# Patient Record
Sex: Female | Born: 1960
Health system: Southern US, Community
[De-identification: ages and names within clinical notes are randomized; demographics above are authoritative.]

## PROBLEM LIST (undated history)

## (undated) DIAGNOSIS — I10 Essential (primary) hypertension: Secondary | ICD-10-CM

## (undated) DIAGNOSIS — T7840XA Allergy, unspecified, initial encounter: Secondary | ICD-10-CM

## (undated) DIAGNOSIS — M1990 Unspecified osteoarthritis, unspecified site: Secondary | ICD-10-CM

## (undated) HISTORY — PX: NO PAST SURGERIES: SHX2092

## (undated) HISTORY — DX: Essential (primary) hypertension: I10

## (undated) HISTORY — DX: Allergy, unspecified, initial encounter: T78.40XA

---

## 2004-06-08 ENCOUNTER — Encounter: Admission: RE | Admit: 2004-06-08 | Discharge: 2004-06-08 | Payer: Self-pay | Admitting: Internal Medicine

## 2005-07-16 ENCOUNTER — Encounter: Admission: RE | Admit: 2005-07-16 | Discharge: 2005-07-16 | Payer: Self-pay | Admitting: Internal Medicine

## 2006-07-17 ENCOUNTER — Encounter: Admission: RE | Admit: 2006-07-17 | Discharge: 2006-07-17 | Payer: Self-pay | Admitting: Internal Medicine

## 2006-07-28 ENCOUNTER — Encounter: Admission: RE | Admit: 2006-07-28 | Discharge: 2006-07-28 | Payer: Self-pay | Admitting: Internal Medicine

## 2007-07-30 ENCOUNTER — Encounter: Admission: RE | Admit: 2007-07-30 | Discharge: 2007-07-30 | Payer: Self-pay | Admitting: Internal Medicine

## 2008-08-01 ENCOUNTER — Encounter: Admission: RE | Admit: 2008-08-01 | Discharge: 2008-08-01 | Payer: Self-pay | Admitting: Internal Medicine

## 2009-09-18 ENCOUNTER — Encounter: Admission: RE | Admit: 2009-09-18 | Discharge: 2009-09-18 | Payer: Self-pay | Admitting: Internal Medicine

## 2010-02-21 ENCOUNTER — Ambulatory Visit (HOSPITAL_COMMUNITY): Admission: RE | Admit: 2010-02-21 | Discharge: 2010-02-21 | Payer: Self-pay | Admitting: Internal Medicine

## 2010-08-12 ENCOUNTER — Encounter: Payer: Self-pay | Admitting: Internal Medicine

## 2010-09-07 ENCOUNTER — Other Ambulatory Visit: Payer: Self-pay | Admitting: Internal Medicine

## 2010-09-07 DIAGNOSIS — Z1231 Encounter for screening mammogram for malignant neoplasm of breast: Secondary | ICD-10-CM

## 2010-09-26 ENCOUNTER — Ambulatory Visit
Admission: RE | Admit: 2010-09-26 | Discharge: 2010-09-26 | Disposition: A | Discharge status: Home / Self Care | Payer: Commercial Managed Care - PPO | Source: Ambulatory Visit | Attending: Internal Medicine | Admitting: Internal Medicine

## 2010-09-26 DIAGNOSIS — Z1231 Encounter for screening mammogram for malignant neoplasm of breast: Secondary | ICD-10-CM

## 2010-10-15 ENCOUNTER — Ambulatory Visit: Payer: Self-pay

## 2011-08-29 ENCOUNTER — Other Ambulatory Visit: Payer: Self-pay | Admitting: Internal Medicine

## 2011-08-29 DIAGNOSIS — Z1231 Encounter for screening mammogram for malignant neoplasm of breast: Secondary | ICD-10-CM

## 2011-09-27 ENCOUNTER — Ambulatory Visit
Admission: RE | Admit: 2011-09-27 | Discharge: 2011-09-27 | Disposition: A | Discharge status: Home / Self Care | Payer: Commercial Managed Care - PPO | Source: Ambulatory Visit | Attending: Internal Medicine | Admitting: Internal Medicine

## 2011-09-27 DIAGNOSIS — Z1231 Encounter for screening mammogram for malignant neoplasm of breast: Secondary | ICD-10-CM

## 2012-09-21 ENCOUNTER — Other Ambulatory Visit: Payer: Self-pay

## 2012-09-21 DIAGNOSIS — Z1231 Encounter for screening mammogram for malignant neoplasm of breast: Secondary | ICD-10-CM

## 2012-10-26 ENCOUNTER — Ambulatory Visit
Admission: RE | Admit: 2012-10-26 | Discharge: 2012-10-26 | Disposition: A | Discharge status: Home / Self Care | Payer: BC Managed Care – PPO | Source: Ambulatory Visit

## 2012-10-26 DIAGNOSIS — Z1231 Encounter for screening mammogram for malignant neoplasm of breast: Secondary | ICD-10-CM

## 2012-12-02 ENCOUNTER — Encounter: Payer: Self-pay | Admitting: Obstetrics

## 2013-01-14 ENCOUNTER — Encounter: Payer: Self-pay | Admitting: Obstetrics & Gynecology

## 2013-01-14 ENCOUNTER — Ambulatory Visit (INDEPENDENT_AMBULATORY_CARE_PROVIDER_SITE_OTHER): Payer: BC Managed Care – PPO | Admitting: Obstetrics & Gynecology

## 2013-01-14 VITALS — BP 166/87 | HR 71 | Temp 98.4°F | Ht 62.0 in | Wt 172.6 lb

## 2013-01-14 DIAGNOSIS — Z01419 Encounter for gynecological examination (general) (routine) without abnormal findings: Secondary | ICD-10-CM | POA: Insufficient documentation

## 2013-01-14 NOTE — Patient Instructions (Addendum)

## 2013-01-14 NOTE — Progress Notes (Signed)
.   Subjective:     Shannon Williamson is a 52 y.o. female here for a routine exam.  No current complaints.  Personal health questionnaire reviewed: no.   Gynecologic History No LMP recorded. Patient is not currently having periods (Reason: Perimenopausal). Contraception: none Last Pap: 2013. Results were: normal Last mammogram: N/A  Obstetric History OB History   Grav Para Term Preterm Abortions TAB SAB Ect Mult Living                   The following portions of the patient's history were reviewed and updated as appropriate: allergies, current medications, past family history, past medical history, past social history, past surgical history and problem list.  Review of Systems Pertinent items are noted in HPI.    Objective:      General appearance: alert Breasts: normal appearance, no masses or tenderness Abdomen: soft, non-tender; bowel sounds normal; no masses,  no organomegaly Pelvic: cervix normal in appearance, external genitalia normal, no adnexal masses or tenderness, uterus normal size, shape, and consistency and vagina normal without discharge    Assessment:    Healthy female exam.    Plan:  Return prn

## 2013-01-18 LAB — PAP IG, CT-NG NAA, HPV HIGH-RISK
Chlamydia Probe Amp: NEGATIVE
GC Probe Amp: NEGATIVE
HPV DNA High Risk: DETECTED — AB

## 2013-01-29 ENCOUNTER — Encounter: Payer: Self-pay | Admitting: Obstetrics & Gynecology

## 2013-01-29 DIAGNOSIS — IMO0002 Reserved for concepts with insufficient information to code with codable children: Secondary | ICD-10-CM | POA: Insufficient documentation

## 2013-02-24 ENCOUNTER — Encounter: Payer: Self-pay | Admitting: *Deleted

## 2013-05-25 ENCOUNTER — Ambulatory Visit: Payer: BC Managed Care – PPO

## 2013-05-25 ENCOUNTER — Encounter: Payer: Self-pay | Admitting: Physician Assistant

## 2013-05-25 ENCOUNTER — Ambulatory Visit: Payer: BC Managed Care – PPO | Admitting: Family Medicine

## 2013-05-25 VITALS — BP 130/74 | HR 61 | Temp 98.1°F | Resp 16 | Ht 61.0 in | Wt 174.8 lb

## 2013-05-25 DIAGNOSIS — Z9289 Personal history of other medical treatment: Secondary | ICD-10-CM

## 2013-05-25 NOTE — Progress Notes (Signed)
Patient ID: Shannon Williamson MRN: [id number], DOB: [birth year], 52 y.o. Date of Encounter: 05/25/2013, 4:02 PM  Primary Physician: No primary provider on file.  Chief Complaint: Needs CXR  HPI: 52 y.o. 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 Patient with history of positive PPD on 04/17/06. Last had CXR on 04/21/06. She was treated with 9 months of INH 300 mg po daily from 05/12/06 through 01/21/07. This was verified through her treatment card with the [facility]. She denies any cough, fever, chills, weight loss, hemoptysis, night sweats, loss of appetite, SOB, CP, or fatigue. She is from [place] and does not remember if she ever received a BCG vaccine during her childhood.     Past Medical History  Diagnosis Date  . Allergy   . Hypertension      Home Meds: Prior to Admission medications   Medication Sig Start Date End Date Taking? Authorizing Provider  amitriptyline (ELAVIL) 25 MG tablet Take 25 mg by mouth at bedtime as needed for sleep.   Yes Historical Provider, MD  hydrochlorothiazide (MICROZIDE) 12.5 MG capsule Take 12.5 mg by mouth daily.   Yes Historical Provider, MD  loratadine (CLARITIN) 10 MG tablet Take 10 mg by mouth daily.   Yes Historical Provider, MD  metoprolol (LOPRESSOR) 100 MG tablet Take 100 mg by mouth daily.   Yes Historical Provider, MD  naproxen (NAPROSYN) 500 MG tablet Take 500 mg by mouth 2 (two) times daily with a meal.   Yes Historical Provider, MD    Allergies: No Known Allergies  History   Social History  . Marital Status: Married    Spouse Name: N/A    Number of Children: N/A  . Years of Education: N/A   Occupational History  . Not on file.   Social History Main Topics  . Smoking status: Never Smoker   . Smokeless tobacco: Not on file  . Alcohol Use: No  . Drug Use: No  . Sexual Activity: Yes   Other Topics Concern  . Not on file   Social History Narrative  . No narrative on file     Review of  Systems: Constitutional: negative for chills, fever, or fatigue  HEENT: negative for vision changes, hearing loss, congestion, rhinorrhea, ST, epistaxis, or sinus pressure Cardiovascular: negative for chest pain or palpitations Respiratory: negative for hemoptysis, wheezing, shortness of breath, or cough Abdominal: negative for abdominal pain, nausea, vomiting, diarrhea, or constipation Dermatological: negative for rash Neurologic: negative for headache, dizziness, or syncope   Physical Exam: Blood pressure 130/74, pulse 61, temperature 98.1 F (36.7 C), temperature source Oral, resp. rate 16, height 5\' 1"  (1.549 m), weight 174 lb 12.8 oz (79.289 kg), SpO2 99.00%., Body mass index is 33.05 kg/(m^2). General: Well developed, well nourished, in no acute distress. Head: Normocephalic, atraumatic, eyes without discharge, sclera non-icteric, nares are without discharge.   Neck: Supple. Full ROM.  Lungs: Clear bilaterally to auscultation without wheezes, rales, or rhonchi. Breathing is unlabored. Heart: RRR with S1 S2. No murmurs, rubs, or gallops appreciated. Msk:  Strength and tone normal for age. Extremities/Skin: Warm and dry. No clubbing or cyanosis. No edema. No rashes or suspicious lesions. Neuro: Alert and oriented X 3. Moves all extremities spontaneously. Gait is normal. CNII-XII grossly in tact. Psych:  Responds to questions appropriately with a normal affect.   [facility] reading (PRIMARY) by  Dr. Neva Seat.  CXR: Borderline cardiomegaly versus incomplete inspiration. No signs of active or latent tuberculosis.  ASSESSMENT AND PLAN:  52 y.o. female with history of positive PPD in 2007 successfully treated with INH 300 mg daily -No signs of tuberculosis -Cleared for work pending radiologist overread -Will call patient with overread for pick up    Signed, Eula Listen, PA-C [facility] [place], [place] [address] [phone] 05/25/2013 4:02 PM

## 2013-05-26 NOTE — Progress Notes (Signed)
Xray read and patient discussed with Ryan Dunn, PA-C. Agree with assessment and plan of care per his note.   

## 2013-06-11 ENCOUNTER — Other Ambulatory Visit: Payer: Self-pay | Admitting: Orthopedic Surgery

## 2013-06-14 ENCOUNTER — Encounter (HOSPITAL_BASED_OUTPATIENT_CLINIC_OR_DEPARTMENT_OTHER): Payer: Self-pay | Admitting: *Deleted

## 2013-06-14 ENCOUNTER — Encounter (HOSPITAL_BASED_OUTPATIENT_CLINIC_OR_DEPARTMENT_OTHER)
Admission: RE | Admit: 2013-06-14 | Discharge: 2013-06-14 | Disposition: A | Discharge status: Home / Self Care | Payer: BC Managed Care – PPO | Source: Ambulatory Visit | Attending: Orthopedic Surgery | Admitting: Orthopedic Surgery

## 2013-06-14 DIAGNOSIS — Z01810 Encounter for preprocedural cardiovascular examination: Secondary | ICD-10-CM | POA: Insufficient documentation

## 2013-06-14 DIAGNOSIS — Z01812 Encounter for preprocedural laboratory examination: Secondary | ICD-10-CM | POA: Insufficient documentation

## 2013-06-14 DIAGNOSIS — Z01818 Encounter for other preprocedural examination: Secondary | ICD-10-CM | POA: Insufficient documentation

## 2013-06-14 LAB — BASIC METABOLIC PANEL
GFR calc Af Amer: >90 mL/min (ref >90)
GFR calc non Af Amer: >90 mL/min (ref >90)
Glucose, Bld: 170 mg/dL — ABNORMAL HIGH (ref 70–99)
Potassium: 3.9 mEq/L (ref 3.5–5.1)
Sodium: 140 mEq/L (ref 135–145)

## 2013-06-14 NOTE — Progress Notes (Signed)
Pt does speak alittle english-husband speaks well-will try to get interpretor-requested To come in for [id number]

## 2013-06-21 ENCOUNTER — Ambulatory Visit (HOSPITAL_BASED_OUTPATIENT_CLINIC_OR_DEPARTMENT_OTHER)
Admission: RE | Admit: 2013-06-21 | Discharge: 2013-06-21 | Disposition: A | Discharge status: Home / Self Care | Payer: BC Managed Care – PPO | Source: Ambulatory Visit | Attending: Orthopedic Surgery | Admitting: Orthopedic Surgery

## 2013-06-21 ENCOUNTER — Encounter (HOSPITAL_BASED_OUTPATIENT_CLINIC_OR_DEPARTMENT_OTHER): Payer: Self-pay | Admitting: Certified Registered Nurse Anesthetist

## 2013-06-21 ENCOUNTER — Ambulatory Visit (HOSPITAL_BASED_OUTPATIENT_CLINIC_OR_DEPARTMENT_OTHER): Payer: BC Managed Care – PPO | Admitting: Anesthesiology

## 2013-06-21 ENCOUNTER — Encounter (HOSPITAL_BASED_OUTPATIENT_CLINIC_OR_DEPARTMENT_OTHER): Payer: BC Managed Care – PPO | Admitting: Anesthesiology

## 2013-06-21 ENCOUNTER — Encounter (HOSPITAL_BASED_OUTPATIENT_CLINIC_OR_DEPARTMENT_OTHER)
Admission: RE | Disposition: A | Discharge status: Home / Self Care | Payer: Self-pay | Source: Ambulatory Visit | Attending: Orthopedic Surgery

## 2013-06-21 DIAGNOSIS — I10 Essential (primary) hypertension: Secondary | ICD-10-CM | POA: Insufficient documentation

## 2013-06-21 DIAGNOSIS — G5600 Carpal tunnel syndrome, unspecified upper limb: Secondary | ICD-10-CM | POA: Insufficient documentation

## 2013-06-21 HISTORY — PX: CARPAL TUNNEL RELEASE: SHX101

## 2013-06-21 SURGERY — CARPAL TUNNEL RELEASE
Anesthesia: General | Site: Wrist | Laterality: Right

## 2013-06-21 MED ORDER — MIDAZOLAM HCL 2 MG/2ML IJ SOLN: 1.0000 mg | INTRAMUSCULAR | Status: DC | PRN

## 2013-06-21 MED ORDER — PROPOFOL 10 MG/ML IV BOLUS
INTRAVENOUS | Status: DC | PRN
  Administered 2013-06-21: 200 mg via INTRAVENOUS

## 2013-06-21 MED ORDER — BUPIVACAINE HCL (PF) 0.25 % IJ SOLN
INTRAMUSCULAR | Status: AC
  Filled 2013-06-21: qty 30

## 2013-06-21 MED ORDER — OXYCODONE HCL 5 MG/5ML PO SOLN: 5.0000 mg | Freq: Once | ORAL | Status: AC | PRN

## 2013-06-21 MED ORDER — CEFAZOLIN SODIUM-DEXTROSE 2-3 GM-% IV SOLR
2.0000 g | INTRAVENOUS | Status: AC
  Administered 2013-06-21: 2 g via INTRAVENOUS

## 2013-06-21 MED ORDER — OXYCODONE HCL 5 MG PO TABS
ORAL_TABLET | ORAL | Status: AC
  Filled 2013-06-21: qty 1

## 2013-06-21 MED ORDER — HYDROCODONE-ACETAMINOPHEN 5-325 MG PO TABS: 1.0000 | ORAL_TABLET | Freq: Four times a day (QID) | ORAL | Status: DC | PRN

## 2013-06-21 MED ORDER — HYDROMORPHONE HCL PF 1 MG/ML IJ SOLN: 0.2500 mg | INTRAMUSCULAR | Status: DC | PRN

## 2013-06-21 MED ORDER — FENTANYL CITRATE 0.05 MG/ML IJ SOLN
INTRAMUSCULAR | Status: AC
  Filled 2013-06-21: qty 4

## 2013-06-21 MED ORDER — ONDANSETRON HCL 4 MG/2ML IJ SOLN
INTRAMUSCULAR | Status: DC | PRN
  Administered 2013-06-21: 4 mg via INTRAVENOUS

## 2013-06-21 MED ORDER — LIDOCAINE HCL (CARDIAC) 20 MG/ML IV SOLN
INTRAVENOUS | Status: DC | PRN
  Administered 2013-06-21: 60 mg via INTRAVENOUS

## 2013-06-21 MED ORDER — LACTATED RINGERS IV SOLN
INTRAVENOUS | Status: DC
  Administered 2013-06-21 (×2): via INTRAVENOUS

## 2013-06-21 MED ORDER — CEFAZOLIN SODIUM-DEXTROSE 2-3 GM-% IV SOLR
INTRAVENOUS | Status: AC
  Filled 2013-06-21: qty 50

## 2013-06-21 MED ORDER — CHLORHEXIDINE GLUCONATE 4 % EX LIQD: 60.0000 mL | Freq: Once | CUTANEOUS | Status: DC

## 2013-06-21 MED ORDER — MIDAZOLAM HCL 5 MG/5ML IJ SOLN
INTRAMUSCULAR | Status: DC | PRN
  Administered 2013-06-21: 2 mg via INTRAVENOUS

## 2013-06-21 MED ORDER — MIDAZOLAM HCL 2 MG/2ML IJ SOLN
INTRAMUSCULAR | Status: AC
  Filled 2013-06-21: qty 2

## 2013-06-21 MED ORDER — OXYCODONE HCL 5 MG PO TABS
5.0000 mg | ORAL_TABLET | Freq: Once | ORAL | Status: AC | PRN
  Administered 2013-06-21: 5 mg via ORAL

## 2013-06-21 MED ORDER — FENTANYL CITRATE 0.05 MG/ML IJ SOLN: 50.0000 ug | INTRAMUSCULAR | Status: DC | PRN

## 2013-06-21 MED ORDER — BUPIVACAINE HCL (PF) 0.25 % IJ SOLN
INTRAMUSCULAR | Status: DC | PRN
  Administered 2013-06-21: 7 mL

## 2013-06-21 MED ORDER — PROMETHAZINE HCL 25 MG/ML IJ SOLN: 6.2500 mg | INTRAMUSCULAR | Status: DC | PRN

## 2013-06-21 MED ORDER — FENTANYL CITRATE 0.05 MG/ML IJ SOLN
INTRAMUSCULAR | Status: DC | PRN
  Administered 2013-06-21: 50 ug via INTRAVENOUS
  Administered 2013-06-21: 25 ug via INTRAVENOUS

## 2013-06-21 MED ORDER — DEXAMETHASONE SODIUM PHOSPHATE 10 MG/ML IJ SOLN
INTRAMUSCULAR | Status: DC | PRN
  Administered 2013-06-21: 5 mg via INTRAVENOUS

## 2013-06-21 SURGICAL SUPPLY — 38 items
BANDAGE GAUZE ELAST BULKY 4 IN (GAUZE/BANDAGES/DRESSINGS) ×2 IMPLANT
BLADE SURG 15 STRL LF DISP TIS (BLADE) ×1 IMPLANT
BLADE SURG 15 STRL SS (BLADE) ×1
BNDG COHESIVE 3X5 TAN STRL LF (GAUZE/BANDAGES/DRESSINGS) ×2 IMPLANT
BNDG ESMARK 4X9 LF (GAUZE/BANDAGES/DRESSINGS) ×2 IMPLANT
CHLORAPREP W/TINT 26ML (MISCELLANEOUS) ×2 IMPLANT
CORDS BIPOLAR (ELECTRODE) ×2 IMPLANT
COVER MAYO STAND STRL (DRAPES) ×2 IMPLANT
COVER TABLE BACK 60X90 (DRAPES) ×2 IMPLANT
CUFF TOURNIQUET SINGLE 18IN (TOURNIQUET CUFF) ×2 IMPLANT
DRAPE EXTREMITY T 121X128X90 (DRAPE) ×2 IMPLANT
DRAPE SURG 17X23 STRL (DRAPES) ×2 IMPLANT
DRSG KUZMA FLUFF (GAUZE/BANDAGES/DRESSINGS) ×2 IMPLANT
GAUZE XEROFORM 1X8 LF (GAUZE/BANDAGES/DRESSINGS) ×2 IMPLANT
GLOVE BIO SURGEON STRL SZ 6.5 (GLOVE) IMPLANT
GLOVE BIOGEL PI IND STRL 7.0 (GLOVE) ×1 IMPLANT
GLOVE BIOGEL PI IND STRL 8.5 (GLOVE) ×1 IMPLANT
GLOVE BIOGEL PI INDICATOR 7.0 (GLOVE) ×1
GLOVE BIOGEL PI INDICATOR 8.5 (GLOVE) ×1
GLOVE ECLIPSE 6.5 STRL STRAW (GLOVE) ×2 IMPLANT
GLOVE SURG ORTHO 8.0 STRL STRW (GLOVE) ×2 IMPLANT
GOWN BRE IMP PREV XXLGXLNG (GOWN DISPOSABLE) ×2 IMPLANT
GOWN PREVENTION PLUS XLARGE (GOWN DISPOSABLE) ×2 IMPLANT
NEEDLE 27GAX1X1/2 (NEEDLE) ×2 IMPLANT
NS IRRIG 1000ML POUR BTL (IV SOLUTION) ×2 IMPLANT
PACK BASIN DAY SURGERY FS (CUSTOM PROCEDURE TRAY) ×2 IMPLANT
PAD CAST 3X4 CTTN HI CHSV (CAST SUPPLIES) ×1 IMPLANT
PADDING CAST ABS 4INX4YD NS (CAST SUPPLIES) ×1
PADDING CAST ABS COTTON 4X4 ST (CAST SUPPLIES) ×1 IMPLANT
PADDING CAST COTTON 3X4 STRL (CAST SUPPLIES) ×1
SPONGE GAUZE 4X4 12PLY (GAUZE/BANDAGES/DRESSINGS) ×2 IMPLANT
STOCKINETTE 4X48 STRL (DRAPES) ×2 IMPLANT
SUT VICRYL 4-0 PS2 18IN ABS (SUTURE) IMPLANT
SUT VICRYL RAPIDE 4/0 PS 2 (SUTURE) ×2 IMPLANT
SYR BULB 3OZ (MISCELLANEOUS) ×2 IMPLANT
SYR CONTROL 10ML LL (SYRINGE) ×2 IMPLANT
TOWEL OR 17X24 6PK STRL BLUE (TOWEL DISPOSABLE) ×2 IMPLANT
UNDERPAD 30X30 INCONTINENT (UNDERPADS AND DIAPERS) ×2 IMPLANT

## 2013-06-21 NOTE — Anesthesia Procedure Notes (Signed)
Procedure Name: LMA Insertion Performed by: Nicki Reaper Pre-anesthesia Checklist: Patient identified, Emergency Drugs available, Suction available and Patient being monitored Patient Re-evaluated:Patient Re-evaluated prior to inductionOxygen Delivery Method: Circle System Utilized Preoxygenation: Pre-oxygenation with 100% oxygen Intubation Type: IV induction Ventilation: Mask ventilation without difficulty LMA: LMA inserted LMA Size: 4.0 Number of attempts: 1 Airway Equipment and Method: bite block Placement Confirmation: positive ETCO2 Tube secured with: Tape Dental Injury: Teeth and Oropharynx as per pre-operative assessment

## 2013-06-21 NOTE — Anesthesia Postprocedure Evaluation (Signed)
  Anesthesia Post-op Note  Patient: Shannon Williamson  Procedure(s) Performed: Procedure(s): RIGHT CARPAL TUNNEL RELEASE (Right)  Patient Location: PACU  Anesthesia Type:General  Level of Consciousness: awake  Airway and Oxygen Therapy: Patient Spontanous Breathing  Post-op Pain: mild  Post-op Assessment: Post-op Vital signs reviewed  Post-op Vital Signs: stable  Complications: No apparent anesthesia complications

## 2013-06-21 NOTE — Transfer of Care (Signed)
Immediate Anesthesia Transfer of Care Note  Patient: Shannon Williamson  Procedure(s) Performed: Procedure(s): RIGHT CARPAL TUNNEL RELEASE (Right)  Patient Location: PACU  Anesthesia Type:General  Level of Consciousness: awake and patient cooperative  Airway & Oxygen Therapy: Patient Spontanous Breathing and Patient connected to face mask oxygen  Post-op Assessment: Report given to PACU RN and Post -op Vital signs reviewed and stable  Post vital signs: Reviewed and stable  Complications: No apparent anesthesia complications

## 2013-06-21 NOTE — Op Note (Signed)
   Dictation Number [id number]

## 2013-06-21 NOTE — H&P (Signed)
Shannon Williamson is a 52 year-old right-hand dominant female with bilateral hand pain and numbness going on for five months waking her 7/7 nights. She has no history of injury to the hand or to the neck. She complains of decreased grip strength. She was placed on Naprosyn which she states does not give her significant relief. She complains of a constant, moderate aching type pain with a feeling of numbness of all of her fingers.  She has no history of diabetes, thyroid problems, arthritis of gout. These are equivalent right to left. She has had her nerve conductions done by Dr. Johna Roles revealing severe carpal tunnel syndrome bilaterally with a motor delay of 7.3 on the left, 6.7 on the right, sensory delay of 5.0 on the left and 4.1 on the right with amplitude diminution to 9.7 on the left and 10 on the right.   ALLERGIES:    None.  MEDICATIONS:     Metoprolol, HCTZ, loratadine, Naprosyn.   SURGICAL HISTORY:     None.  FAMILY MEDICAL HISTORY:   Negative.     SOCIAL HISTORY:    She does not smoke or drink.  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  REVIEW OF SYSTEMS:    Positive for high blood pressure, sleep disorder, otherwise negative 14 points.  Shannon Williamson is an 52 y.o. female.   Chief Complaint: numbness hands right HPI: see above  Past Medical History  Diagnosis Date  . Allergy   . Hypertension     Past Surgical History  Procedure Laterality Date  . No past surgeries      History reviewed. No pertinent family history. Social History:  reports that she has never smoked. She does not have any smokeless tobacco history on file. She reports that she does not drink alcohol or use illicit drugs.  Allergies: No Known Allergies  Medications Prior to Admission  Medication Sig Dispense Refill  . amitriptyline (ELAVIL) 25 MG tablet Take 25 mg by mouth at bedtime as needed for sleep.      . hydrochlorothiazide (MICROZIDE) 12.5 MG capsule Take 12.5 mg by mouth daily.      Marland Kitchen  loratadine (CLARITIN) 10 MG tablet Take 10 mg by mouth daily.      . metoprolol (LOPRESSOR) 100 MG tablet Take 100 mg by mouth daily.      . naproxen (NAPROSYN) 500 MG tablet Take 500 mg by mouth 2 (two) times daily with a meal.        Results for orders placed during the hospital encounter of 06/21/13 (from the past 48 hour(s))  POCT HEMOGLOBIN-HEMACUE     Status: None   Collection Time    06/21/13  1:00 PM      Result Value Range   Hemoglobin 13.8  12.0 - 15.0 g/dL    No results found.   Pertinent items are noted in HPI.  Blood pressure 177/88, pulse 79, temperature 98.2 F (36.8 C), temperature source Oral, resp. rate 20, height 5\' 1"  (1.549 m), weight 172 lb 6.4 oz (78.2 kg), SpO2 100.00%.  General appearance: alert, cooperative and appears stated age Head: Normocephalic, without obvious abnormality Neck: no JVD Resp: clear to auscultation bilaterally Cardio: regular rate and rhythm, S1, S2 normal, no murmur, click, rub or gallop GI: soft, non-tender; bowel sounds normal; no masses,  no organomegaly Extremities: extremities normal, atraumatic, no cyanosis or edema Pulses: 2+ and symmetric Skin: Skin color, texture, turgor normal. No rashes or lesions Neurologic: Grossly normal Incision/Wound:  na  Assessment/Plan We have discussed the possibility of surgical decompression of the median nerve at the wrist with her on each side. The pre, peri and post op course are discussed along with risks and complications.  She is aware there is no guarantee with surgery, possibility of infection, recurrence, injury to arteries, nerves and tendons, incomplete relief of symptoms and dystrophy.  She would like to proceed to have the right side done. She is scheduled for right carpal tunnel release as an outpatient under regional anesthesia.  Azura Tufaro R 06/21/2013, 1:48 PM

## 2013-06-21 NOTE — Anesthesia Preprocedure Evaluation (Addendum)
Anesthesia Evaluation  Patient identified by MRN, date of birth, ID band Patient awake    Reviewed: Allergy & Precautions, NPO status   Airway       Dental   Pulmonary  breath sounds clear to auscultation        Cardiovascular hypertension, Pt. on home beta blockers Rhythm:Regular Rate:Normal     Neuro/Psych    GI/Hepatic   Endo/Other    Renal/GU      Musculoskeletal   Abdominal   Peds  Hematology   Anesthesia Other Findings   Reproductive/Obstetrics                         Anesthesia Physical Anesthesia Plan  ASA: II  Anesthesia Plan: General   Post-op Pain Management:    Induction: Intravenous  Airway Management Planned: LMA  Additional Equipment:   Intra-op Plan:   Post-operative Plan: Extubation in OR  Informed Consent:   Dental advisory given  Plan Discussed with: CRNA and Surgeon  Anesthesia Plan Comments:        Anesthesia Quick Evaluation

## 2013-06-21 NOTE — Brief Op Note (Signed)
06/21/2013  3:06 PM  PATIENT:  Shannon Williamson  52 y.o. female  PRE-OPERATIVE DIAGNOSIS:  CARPAL TUNNEL SUNDROME;RIGHT  POST-OPERATIVE DIAGNOSIS:  Right Carpal Tunnel Syndrome  PROCEDURE:  Procedure(s): RIGHT CARPAL TUNNEL RELEASE (Right)  SURGEON:  Surgeon(s) and Role:    * Nicki Reaper, MD - Primary  PHYSICIAN ASSISTANT:   ASSISTANTS: none   ANESTHESIA:   local and general  EBL:  Total I/O In: 1000 [I.V.:1000] Out: -   BLOOD ADMINISTERED:none  DRAINS: none   LOCAL MEDICATIONS USED:  BUPIVICAINE   SPECIMEN:  No Specimen  DISPOSITION OF SPECIMEN:  N/A  COUNTS:  YES  TOURNIQUET:   Total Tourniquet Time Documented: Forearm (Right) - 13 minutes Total: Forearm (Right) - 13 minutes   DICTATION: .Other Dictation: Dictation Number [id number]  PLAN OF CARE: Discharge to home after PACU  PATIENT DISPOSITION:  PACU - hemodynamically stable.

## 2013-06-22 ENCOUNTER — Encounter (HOSPITAL_BASED_OUTPATIENT_CLINIC_OR_DEPARTMENT_OTHER): Payer: Self-pay | Admitting: Orthopedic Surgery

## 2013-06-22 NOTE — Op Note (Signed)
[name], Shannon Williamson          ACCOUNT NO.:  [id number]  MEDICAL RECORD NO.:  [id number]  LOCATION:                                 FACILITY:  PHYSICIAN:  Cindee Salt, M.D.            DATE OF BIRTH:  DATE OF PROCEDURE:  06/21/2013 DATE OF DISCHARGE:                              OPERATIVE REPORT   PREOPERATIVE DIAGNOSIS:  Carpal tunnel syndrome bilaterally.  POSTOPERATIVE DIAGNOSIS:  Carpal tunnel syndrome bilaterally.  OPERATION:  Decompression right median nerve at the wrist.  SURGEON:  Cindee Salt, MD  ANESTHESIA:  General with local infiltration.  ANESTHESIOLOGIST:  Burna Forts, MD  HISTORY:  The patient is a 52 year old female with a history of bilateral hand pain, nerve conductions are positive for carpal tunnel syndrome bilaterally.  She has elected to undergo surgical release. Pre, peri, and postoperative course have been discussed along with risks and complications.  She is aware that there is no guarantee with the surgery, possibility of infection, recurrence of injury to arteries, nerves, tendons, incomplete relief of symptoms, and dystrophy.  In the preoperative area, the patient is seen, the extremity marked by both the patient and surgeon.  Antibiotic given.  PROCEDURE IN DETAIL:  The patient was brought to the operating room where a general anesthetic was carried out without difficulty.  She was prepped using ChloraPrep, supine position with the right arm free.  A 3- minute dry time was allowed.  Time-out taken, confirming patient and procedure.  The limb was exsanguinated with an Esmarch bandage. Tourniquet placed and the forearm was inflated to 250 mmHg.  A longitudinal incision was made in the right palm and carried down through subcutaneous tissue.  Bleeders were electrocauterized.  Palmar fascia was split.  Superficial palmar arch identified.  Flexor tendon of the ring and little finger identified. To the ulnar side of the median nerve, the  carpal retinaculum was incised with sharp dissection.  Right angle and Sewall retractor were placed between the skin and forearm fascia.  The fascia released for approximately a centimeter and half to two under direct vision.  The canal was explored.  Tenosynovium tissue was moderately thickened.  The area of compression to the nerve was apparent.  Motor branch underneath the muscle.  No further lesions were identified.  The wound was irrigated.  The skin closed with interrupted 4-0 Vicryl Rapide sutures.  Local infiltration with 0.25% Marcaine without epinephrine was given, 8 mL was used.  A sterile compressive dressing was applied with the fingers free.  On deflation of the tourniquet, all fingers immediately pinked.  She was taken to the recovery room for observation in satisfactory condition.  She will be discharged home to return to the [facility] [place] in 1 week on [place].          ______________________________ Cindee Salt, M.D.     GK/MEDQ  D:  06/21/2013  T:  06/22/2013  Job:  [id number]

## 2013-10-13 ENCOUNTER — Other Ambulatory Visit: Payer: Self-pay

## 2013-10-13 DIAGNOSIS — Z1231 Encounter for screening mammogram for malignant neoplasm of breast: Secondary | ICD-10-CM

## 2013-11-04 ENCOUNTER — Ambulatory Visit
Admission: RE | Admit: 2013-11-04 | Discharge: 2013-11-04 | Disposition: A | Discharge status: Home / Self Care | Payer: BC Managed Care – PPO | Source: Ambulatory Visit

## 2013-11-04 DIAGNOSIS — Z1231 Encounter for screening mammogram for malignant neoplasm of breast: Secondary | ICD-10-CM

## 2014-10-25 ENCOUNTER — Other Ambulatory Visit: Payer: Self-pay

## 2014-10-25 DIAGNOSIS — Z1231 Encounter for screening mammogram for malignant neoplasm of breast: Secondary | ICD-10-CM

## 2014-11-08 ENCOUNTER — Ambulatory Visit
Admission: RE | Admit: 2014-11-08 | Discharge: 2014-11-08 | Disposition: A | Discharge status: Home / Self Care | Payer: BLUE CROSS/BLUE SHIELD | Source: Ambulatory Visit

## 2014-11-08 DIAGNOSIS — Z1231 Encounter for screening mammogram for malignant neoplasm of breast: Secondary | ICD-10-CM

## 2014-12-14 ENCOUNTER — Ambulatory Visit: Payer: BLUE CROSS/BLUE SHIELD | Admitting: Certified Nurse Midwife

## 2014-12-28 ENCOUNTER — Ambulatory Visit: Payer: BLUE CROSS/BLUE SHIELD | Admitting: Certified Nurse Midwife

## 2015-08-25 ENCOUNTER — Ambulatory Visit (INDEPENDENT_AMBULATORY_CARE_PROVIDER_SITE_OTHER): Payer: BLUE CROSS/BLUE SHIELD | Admitting: Internal Medicine

## 2015-08-25 VITALS — BP 136/82 | HR 85 | Temp 98.4°F | Resp 17 | Ht 63.5 in | Wt 173.0 lb

## 2015-08-25 DIAGNOSIS — R509 Fever, unspecified: Secondary | ICD-10-CM

## 2015-08-25 DIAGNOSIS — J22 Unspecified acute lower respiratory infection: Secondary | ICD-10-CM

## 2015-08-25 DIAGNOSIS — J988 Other specified respiratory disorders: Secondary | ICD-10-CM | POA: Diagnosis not present

## 2015-08-25 LAB — POCT CBC
Granulocyte percent: 81 %G — AB (ref 37–80)
HEMATOCRIT: 39.7 % (ref 37.7–47.9)
HEMOGLOBIN: 13.6 g/dL (ref 12.2–16.2)
LYMPH, POC: 2.1 (ref 0.6–3.4)
MCH, POC: 29.8 pg (ref 27–31.2)
MCHC: 34.1 g/dL (ref 31.8–35.4)
MCV: 87.4 fL (ref 80–97)
MID (cbc): 0.5 (ref 0–0.9)
MPV: 7.7 fL (ref 0–99.8)
POC Granulocyte: 11.4 — AB (ref 2–6.9)
POC LYMPH %: 15.1 % (ref 10–50)
POC MID %: 3.9 %M (ref 0–12)
Platelet Count, POC: 223 10*3/uL (ref 142–424)
RBC: 4.54 M/uL (ref 4.04–5.48)
RDW, POC: 13.6 %
WBC: 14.1 10*3/uL — AB (ref 4.6–10.2)

## 2015-08-25 MED ORDER — HYDROCODONE-HOMATROPINE 5-1.5 MG/5ML PO SYRP: 5.0000 mL | ORAL_SOLUTION | Freq: Four times a day (QID) | ORAL | Status: DC | PRN

## 2015-08-25 MED ORDER — AZITHROMYCIN 500 MG PO TABS: 500.0000 mg | ORAL_TABLET | Freq: Every day | ORAL | Status: DC

## 2015-08-25 NOTE — Progress Notes (Signed)
Subjective:  By signing my name below, I, Essence Howell, attest that this documentation has been prepared under the direction and in the presence of Tonye Pearson, MD Electronically Signed: Charline Bills, ED Scribe 08/25/2015 at 1:04 PM.   Patient ID: Shannon Williamson, female    DOB: Sep 01, 1960, 55 y.o.   MRN: [id number]  Chief Complaint  Patient presents with  . Cough  . Fatigue   HPI HPI Comments: Shannon Williamson is a 55 y.o. female, with a h/o HTN, who presents to the [facility] complaining of abrupt onset of cough, chills and fever, onset yesterday. Pt states that cough keeps her awake at night. She reports associated fatigue, diaphoresis, fever yesterday that has resolved, sore throat, generalized body aches. No treatments tried PTA. Pt denies sob and congestion. Had flu shot    Past Medical History  Diagnosis Date  . Allergy   . Hypertension    Current Outpatient Prescriptions on File Prior to Visit  Medication Sig Dispense Refill  . amitriptyline (ELAVIL) 25 MG tablet Take 25 mg by mouth at bedtime as needed for sleep.    . hydrochlorothiazide (MICROZIDE) 12.5 MG capsule Take 12.5 mg by mouth daily.    Marland Kitchen HYDROcodone-acetaminophen (NORCO) 5-325 MG per tablet Take 1 tablet by mouth every 6 (six) hours as needed for moderate pain. 30 tablet 0  . loratadine (CLARITIN) 10 MG tablet Take 10 mg by mouth daily.    . metoprolol (LOPRESSOR) 100 MG tablet Take 100 mg by mouth daily.    . naproxen (NAPROSYN) 500 MG tablet Take 500 mg by mouth 2 (two) times daily with a meal.     No current facility-administered medications on file prior to visit.   No Known Allergies  Review of Systems  Constitutional: Positive for fever (resolved), diaphoresis and fatigue.  HENT: Positive for sore throat. Negative for congestion.   Respiratory: Positive for cough. Negative for shortness of breath.   Musculoskeletal: Positive for myalgias.      Objective:   Physical Exam  Constitutional: She is oriented to person, place, and time. She appears well-developed and well-nourished. No distress.  HENT:  Head: Normocephalic and atraumatic.  Right Ear: External ear normal.  Left Ear: External ear normal.  Nose: Nose normal.  Mouth/Throat: Oropharynx is clear and moist. No oropharyngeal exudate.  Throat is clear. No nodes.  Eyes: Conjunctivae and EOM are normal. Pupils are equal, round, and reactive to light.  Neck: Neck supple. No thyromegaly present.  Cardiovascular: Normal rate, regular rhythm and normal heart sounds.   No murmur heard. Pulmonary/Chest: Effort normal. No respiratory distress. She has rhonchi (scattered).  decr R base Sl wheeze R ant forced expir that clears with cough  Musculoskeletal: Normal range of motion.  Lymphadenopathy:    She has no cervical adenopathy.  Neurological: She is alert and oriented to person, place, and time.  Skin: Skin is warm and dry.  Psychiatric: She has a normal mood and affect. Her behavior is normal.  Nursing note and vitals reviewed. BP 136/82 mmHg  Pulse 85  Temp(Src) 98.4 F (36.9 C) (Oral)  Resp 17  Ht 5' 3.5" (1.613 m)  Wt 173 lb (78.472 kg)  BMI 30.16 kg/m2  SpO2 98%      Assessment & Plan:  Acute LRI consistent with CAP  Meds ordered this encounter  Medications  . azithromycin (ZITHROMAX) 500 MG tablet    Sig: Take 1 tablet (500 mg total) by mouth daily.  Dispense:  5 tablet    Refill:  0  . HYDROcodone-homatropine (HYCODAN) 5-1.5 MG/5ML syrup    Sig: Take 5 mLs by mouth every 6 (six) hours as needed.    Dispense:  120 mL    Refill:  0  OOW Reck 3d  I have completed the patient encounter in its entirety as documented by the scribe, with editing by me where necessary. Emelina Hinch P. Merla Riches, M.D.

## 2015-08-25 NOTE — Patient Instructions (Signed)
Recheck Monday if not well enough for work

## 2015-10-23 ENCOUNTER — Other Ambulatory Visit: Payer: Self-pay

## 2015-10-23 DIAGNOSIS — Z1231 Encounter for screening mammogram for malignant neoplasm of breast: Secondary | ICD-10-CM

## 2015-11-13 ENCOUNTER — Ambulatory Visit: Payer: BLUE CROSS/BLUE SHIELD

## 2015-12-11 ENCOUNTER — Ambulatory Visit
Admission: RE | Admit: 2015-12-11 | Discharge: 2015-12-11 | Disposition: A | Discharge status: Home / Self Care | Payer: BLUE CROSS/BLUE SHIELD | Source: Ambulatory Visit

## 2015-12-11 DIAGNOSIS — Z1231 Encounter for screening mammogram for malignant neoplasm of breast: Secondary | ICD-10-CM

## 2016-02-06 DIAGNOSIS — J302 Other seasonal allergic rhinitis: Secondary | ICD-10-CM | POA: Diagnosis not present

## 2016-02-06 DIAGNOSIS — K219 Gastro-esophageal reflux disease without esophagitis: Secondary | ICD-10-CM | POA: Diagnosis not present

## 2016-02-06 DIAGNOSIS — I10 Essential (primary) hypertension: Secondary | ICD-10-CM | POA: Diagnosis not present

## 2016-03-05 DIAGNOSIS — I10 Essential (primary) hypertension: Secondary | ICD-10-CM | POA: Diagnosis not present

## 2016-03-05 DIAGNOSIS — K219 Gastro-esophageal reflux disease without esophagitis: Secondary | ICD-10-CM | POA: Diagnosis not present

## 2016-03-05 DIAGNOSIS — E559 Vitamin D deficiency, unspecified: Secondary | ICD-10-CM | POA: Diagnosis not present

## 2016-03-05 DIAGNOSIS — N390 Urinary tract infection, site not specified: Secondary | ICD-10-CM | POA: Diagnosis not present

## 2016-04-09 DIAGNOSIS — I10 Essential (primary) hypertension: Secondary | ICD-10-CM | POA: Diagnosis not present

## 2016-04-09 DIAGNOSIS — E2839 Other primary ovarian failure: Secondary | ICD-10-CM | POA: Diagnosis not present

## 2016-04-09 DIAGNOSIS — K219 Gastro-esophageal reflux disease without esophagitis: Secondary | ICD-10-CM | POA: Diagnosis not present

## 2016-04-09 DIAGNOSIS — J302 Other seasonal allergic rhinitis: Secondary | ICD-10-CM | POA: Diagnosis not present

## 2016-04-16 ENCOUNTER — Other Ambulatory Visit: Payer: Self-pay | Admitting: Internal Medicine

## 2016-04-16 DIAGNOSIS — E2839 Other primary ovarian failure: Secondary | ICD-10-CM

## 2016-04-29 ENCOUNTER — Ambulatory Visit
Admission: RE | Admit: 2016-04-29 | Discharge: 2016-04-29 | Disposition: A | Discharge status: Home / Self Care | Payer: BLUE CROSS/BLUE SHIELD | Source: Ambulatory Visit | Attending: Internal Medicine | Admitting: Internal Medicine

## 2016-04-29 DIAGNOSIS — Z13820 Encounter for screening for osteoporosis: Secondary | ICD-10-CM | POA: Diagnosis not present

## 2016-04-29 DIAGNOSIS — Z780 Asymptomatic menopausal state: Secondary | ICD-10-CM | POA: Diagnosis not present

## 2016-04-29 DIAGNOSIS — E2839 Other primary ovarian failure: Secondary | ICD-10-CM

## 2016-05-21 DIAGNOSIS — I10 Essential (primary) hypertension: Secondary | ICD-10-CM | POA: Diagnosis not present

## 2016-05-21 DIAGNOSIS — K219 Gastro-esophageal reflux disease without esophagitis: Secondary | ICD-10-CM | POA: Diagnosis not present

## 2016-05-21 DIAGNOSIS — E669 Obesity, unspecified: Secondary | ICD-10-CM | POA: Diagnosis not present

## 2016-05-21 DIAGNOSIS — J302 Other seasonal allergic rhinitis: Secondary | ICD-10-CM | POA: Diagnosis not present

## 2016-08-20 DIAGNOSIS — Z13220 Encounter for screening for lipoid disorders: Secondary | ICD-10-CM | POA: Diagnosis not present

## 2016-08-20 DIAGNOSIS — M5442 Lumbago with sciatica, left side: Secondary | ICD-10-CM | POA: Diagnosis not present

## 2016-08-20 DIAGNOSIS — Z131 Encounter for screening for diabetes mellitus: Secondary | ICD-10-CM | POA: Diagnosis not present

## 2016-08-20 DIAGNOSIS — I10 Essential (primary) hypertension: Secondary | ICD-10-CM | POA: Diagnosis not present

## 2016-09-19 DIAGNOSIS — K219 Gastro-esophageal reflux disease without esophagitis: Secondary | ICD-10-CM | POA: Diagnosis not present

## 2016-09-19 DIAGNOSIS — I10 Essential (primary) hypertension: Secondary | ICD-10-CM | POA: Diagnosis not present

## 2016-09-19 DIAGNOSIS — M5430 Sciatica, unspecified side: Secondary | ICD-10-CM | POA: Diagnosis not present

## 2016-10-14 DIAGNOSIS — M79605 Pain in left leg: Secondary | ICD-10-CM | POA: Diagnosis not present

## 2016-10-14 DIAGNOSIS — M79604 Pain in right leg: Secondary | ICD-10-CM | POA: Diagnosis not present

## 2016-10-21 DIAGNOSIS — M79604 Pain in right leg: Secondary | ICD-10-CM | POA: Diagnosis not present

## 2016-10-21 DIAGNOSIS — M79605 Pain in left leg: Secondary | ICD-10-CM | POA: Diagnosis not present

## 2016-10-28 DIAGNOSIS — M79604 Pain in right leg: Secondary | ICD-10-CM | POA: Diagnosis not present

## 2016-10-28 DIAGNOSIS — M79605 Pain in left leg: Secondary | ICD-10-CM | POA: Diagnosis not present

## 2016-10-31 DIAGNOSIS — K219 Gastro-esophageal reflux disease without esophagitis: Secondary | ICD-10-CM | POA: Diagnosis not present

## 2016-10-31 DIAGNOSIS — M5432 Sciatica, left side: Secondary | ICD-10-CM | POA: Diagnosis not present

## 2016-10-31 DIAGNOSIS — J302 Other seasonal allergic rhinitis: Secondary | ICD-10-CM | POA: Diagnosis not present

## 2016-10-31 DIAGNOSIS — I10 Essential (primary) hypertension: Secondary | ICD-10-CM | POA: Diagnosis not present

## 2016-11-04 DIAGNOSIS — M79604 Pain in right leg: Secondary | ICD-10-CM | POA: Diagnosis not present

## 2016-11-04 DIAGNOSIS — M79605 Pain in left leg: Secondary | ICD-10-CM | POA: Diagnosis not present

## 2016-11-25 DIAGNOSIS — M79605 Pain in left leg: Secondary | ICD-10-CM | POA: Diagnosis not present

## 2016-11-25 DIAGNOSIS — M79604 Pain in right leg: Secondary | ICD-10-CM | POA: Diagnosis not present

## 2016-11-26 ENCOUNTER — Other Ambulatory Visit: Payer: Self-pay | Admitting: Internal Medicine

## 2016-11-26 DIAGNOSIS — Z1231 Encounter for screening mammogram for malignant neoplasm of breast: Secondary | ICD-10-CM

## 2016-12-03 DIAGNOSIS — M5432 Sciatica, left side: Secondary | ICD-10-CM | POA: Diagnosis not present

## 2016-12-03 DIAGNOSIS — J302 Other seasonal allergic rhinitis: Secondary | ICD-10-CM | POA: Diagnosis not present

## 2016-12-03 DIAGNOSIS — I10 Essential (primary) hypertension: Secondary | ICD-10-CM | POA: Diagnosis not present

## 2016-12-03 DIAGNOSIS — K219 Gastro-esophageal reflux disease without esophagitis: Secondary | ICD-10-CM | POA: Diagnosis not present

## 2016-12-09 DIAGNOSIS — M79605 Pain in left leg: Secondary | ICD-10-CM | POA: Diagnosis not present

## 2016-12-09 DIAGNOSIS — M79604 Pain in right leg: Secondary | ICD-10-CM | POA: Diagnosis not present

## 2016-12-11 ENCOUNTER — Ambulatory Visit
Admission: RE | Admit: 2016-12-11 | Discharge: 2016-12-11 | Disposition: A | Discharge status: Home / Self Care | Payer: BLUE CROSS/BLUE SHIELD | Source: Ambulatory Visit | Attending: Internal Medicine | Admitting: Internal Medicine

## 2016-12-11 DIAGNOSIS — Z1231 Encounter for screening mammogram for malignant neoplasm of breast: Secondary | ICD-10-CM | POA: Diagnosis not present

## 2017-01-14 DIAGNOSIS — K219 Gastro-esophageal reflux disease without esophagitis: Secondary | ICD-10-CM | POA: Diagnosis not present

## 2017-01-14 DIAGNOSIS — I10 Essential (primary) hypertension: Secondary | ICD-10-CM | POA: Diagnosis not present

## 2017-01-14 DIAGNOSIS — E559 Vitamin D deficiency, unspecified: Secondary | ICD-10-CM | POA: Diagnosis not present

## 2017-01-14 DIAGNOSIS — M5432 Sciatica, left side: Secondary | ICD-10-CM | POA: Diagnosis not present

## 2017-02-26 ENCOUNTER — Ambulatory Visit (INDEPENDENT_AMBULATORY_CARE_PROVIDER_SITE_OTHER): Payer: BLUE CROSS/BLUE SHIELD | Admitting: Emergency Medicine

## 2017-02-26 ENCOUNTER — Encounter: Payer: Self-pay | Admitting: Emergency Medicine

## 2017-02-26 ENCOUNTER — Ambulatory Visit (INDEPENDENT_AMBULATORY_CARE_PROVIDER_SITE_OTHER): Payer: BLUE CROSS/BLUE SHIELD

## 2017-02-26 VITALS — BP 150/72 | HR 66 | Temp 98.7°F | Resp 16 | Ht 62.5 in | Wt 174.4 lb

## 2017-02-26 DIAGNOSIS — R059 Cough, unspecified: Secondary | ICD-10-CM | POA: Insufficient documentation

## 2017-02-26 DIAGNOSIS — R05 Cough: Secondary | ICD-10-CM

## 2017-02-26 DIAGNOSIS — Z1211 Encounter for screening for malignant neoplasm of colon: Secondary | ICD-10-CM | POA: Diagnosis not present

## 2017-02-26 DIAGNOSIS — J209 Acute bronchitis, unspecified: Secondary | ICD-10-CM

## 2017-02-26 MED ORDER — PROMETHAZINE-CODEINE 6.25-10 MG/5ML PO SYRP: 5.0000 mL | ORAL_SOLUTION | Freq: Every evening | ORAL | 0 refills | Status: DC | PRN

## 2017-02-26 MED ORDER — AZITHROMYCIN 250 MG PO TABS: ORAL_TABLET | ORAL | 0 refills | Status: DC

## 2017-02-26 MED ORDER — BENZONATATE 200 MG PO CAPS: 200.0000 mg | ORAL_CAPSULE | Freq: Two times a day (BID) | ORAL | 0 refills | Status: DC | PRN

## 2017-02-26 NOTE — Patient Instructions (Addendum)
     IF you received an x-ray today, you will receive an invoice from [facility]. Please contact [facility] at [phone] with questions or concerns regarding your invoice.   IF you received labwork today, you will receive an invoice from [facility]. Please contact [facility] at [phone] with questions or concerns regarding your invoice.   Our billing staff will not be able to assist you with questions regarding bills from these companies.  You will be contacted with the lab results as soon as they are available. The fastest way to get your results is to activate your My Chart account. Instructions are located on the last page of this paperwork. If you have not heard from us regarding the results in 2 weeks, please contact this office.      Acute Bronchitis, Adult Acute bronchitis is when air tubes (bronchi) in the lungs suddenly get swollen. The condition can make it hard to breathe. It can also cause these symptoms:  A cough.  Coughing up clear, yellow, or green mucus.  Wheezing.  Chest congestion.  Shortness of breath.  A fever.  Body aches.  Chills.  A sore throat.  Follow these instructions at home: Medicines  Take over-the-counter and prescription medicines only as told by your doctor.  If you were prescribed an antibiotic medicine, take it as told by your doctor. Do not stop taking the antibiotic even if you start to feel better. General instructions  Rest.  Drink enough fluids to keep your pee (urine) clear or pale yellow.  Avoid smoking and secondhand smoke. If you smoke and you need help quitting, ask your doctor. Quitting will help your lungs heal faster.  Use an inhaler, cool mist vaporizer, or humidifier as told by your doctor.  Keep all follow-up visits as told by your doctor. This is important. How is this prevented? To lower your risk of getting this condition again:  Wash your hands often with soap and water. If you cannot  use soap and water, use hand sanitizer.  Avoid contact with people who have cold symptoms.  Try not to touch your hands to your mouth, nose, or eyes.  Make sure to get the flu shot every year.  Contact a doctor if:  Your symptoms do not get better in 2 weeks. Get help right away if:  You cough up blood.  You have chest pain.  You have very bad shortness of breath.  You become dehydrated.  You faint (pass out) or keep feeling like you are going to pass out.  You keep throwing up (vomiting).  You have a very bad headache.  Your fever or chills gets worse. This information is not intended to replace advice given to you by your health care provider. Make sure you discuss any questions you have with your health care provider. Document Released: 12/25/2007 Document Revised: 02/14/2016 Document Reviewed: 12/27/2015 Elsevier Interactive Patient Education  2017 [employer]  

## 2017-02-26 NOTE — Progress Notes (Signed)
Shannon Williamson 56 y.o.   Chief Complaint  Patient presents with  . Cough    per patient started yesterday    HISTORY OF PRESENT ILLNESS: This is a 56 y.o. female complaining of dry cough since yesterday.  Cough  This is a new problem. The current episode started yesterday. The problem has been gradually worsening. The problem occurs constantly. The cough is non-productive. Pertinent negatives include no chest pain, chills, fever, headaches, hemoptysis, myalgias, nasal congestion, rash, sore throat or shortness of breath. Nothing aggravates the symptoms. Risk factors: none.     Prior to Admission medications   Medication Sig Start Date End Date Taking? Authorizing Provider  amitriptyline (ELAVIL) 25 MG tablet Take 25 mg by mouth at bedtime as needed for sleep.   Yes [provider]  diclofenac (VOLTAREN) 50 MG EC tablet Take 50 mg by mouth 2 (two) times daily.   Yes [provider]  Ergocalciferol (VITAMIN D2) 2000 units TABS Take by mouth.   Yes [provider]  gabapentin (NEURONTIN) 100 MG capsule Take 100 mg by mouth 3 (three) times daily.   Yes [provider]  lisinopril (PRINIVIL,ZESTRIL) 20 MG tablet Take 20 mg by mouth 2 (two) times daily.   Yes [provider]  loratadine (CLARITIN) 10 MG tablet Take 10 mg by mouth daily.   Yes [provider]  metoprolol (LOPRESSOR) 100 MG tablet Take 100 mg by mouth daily.   Yes [provider]  hydrochlorothiazide (MICROZIDE) 12.5 MG capsule Take 12.5 mg by mouth daily.    [provider]  HYDROcodone-acetaminophen (NORCO) 5-325 MG per tablet Take 1 tablet by mouth every 6 (six) hours as needed for moderate pain. Patient not taking: Reported on 02/26/2017 06/21/13   Cindee SaltKuzma, Gary, MD  HYDROcodone-homatropine [facility](HYCODAN) 5-1.5 MG/5ML syrup Take 5 mLs by mouth every 6 (six) hours as needed. Patient not taking: Reported on 02/26/2017 08/25/15   Tonye Pearsonoolittle, Robert P, MD    lisinopril-hydrochlorothiazide (PRINZIDE,ZESTORETIC) 10-12.5 MG tablet Take 1 tablet by mouth daily. 08/07/15   [provider]  naproxen (NAPROSYN) 500 MG tablet Take 500 mg by mouth 2 (two) times daily with a meal.    [provider]    No Known Allergies  Patient Active Problem List   Diagnosis Date Noted  . HPV test positive 01/29/2013  . Routine gynecological examination 01/14/2013    Past Medical History:  Diagnosis Date  . Allergy   . Hypertension     Past Surgical History:  Procedure Laterality Date  . CARPAL TUNNEL RELEASE Right 06/21/2013   Procedure: RIGHT CARPAL TUNNEL RELEASE;  Surgeon: Nicki ReaperGary R Kuzma, MD;  Location: [facility];  Service: Orthopedics;  Laterality: Right;  . NO PAST SURGERIES      Social History   Social History  . Marital status: Married    Spouse name: N/A  . Number of children: N/A  . Years of education: N/A   Occupational History  . Not on file.   Social History Main Topics  . Smoking status: Never Smoker  . Smokeless tobacco: Never Used  . Alcohol use No  . Drug use: No  . Sexual activity: Yes   Other Topics Concern  . Not on file   Social History Narrative  . No narrative on file    No family history on file.   Review of Systems  Constitutional: Negative.  Negative for chills and fever.  HENT: Negative.  Negative for sore throat.   Eyes:  Negative.   Respiratory: Positive for cough. Negative for hemoptysis, sputum production and shortness of breath.   Cardiovascular: Negative for chest pain and palpitations.  Gastrointestinal: Negative.  Negative for abdominal pain, diarrhea, nausea and vomiting.  Genitourinary: Negative.  Negative for dysuria and hematuria.  Musculoskeletal: Negative for myalgias.  Skin: Negative.  Negative for rash.  Neurological: Negative.  Negative for dizziness and headaches.  Endo/Heme/Allergies: Negative.   All other systems reviewed and are negative.  Vitals:    02/26/17 1149 02/26/17 1200  BP: (!) 162/66 (!) 150/72  Pulse: 66   Resp: 16   Temp: 98.7 F (37.1 C)     Physical Exam  Constitutional: She is oriented to person, place, and time. She appears well-developed and well-nourished.  HENT:  Head: Normocephalic and atraumatic.  Nose: Nose normal.  Mouth/Throat: Oropharynx is clear and moist.  Eyes: Pupils are equal, round, and reactive to light. Conjunctivae are normal.  Neck: Normal range of motion. Neck supple. No thyromegaly present.  Cardiovascular: Normal rate, regular rhythm, normal heart sounds and intact distal pulses.   Pulmonary/Chest: Effort normal and breath sounds normal.  Abdominal: Soft. Bowel sounds are normal. There is no tenderness.  Musculoskeletal: Normal range of motion.  Lymphadenopathy:    She has no cervical adenopathy.  Neurological: She is alert and oriented to person, place, and time. No sensory deficit. She exhibits normal muscle tone.  Skin: Skin is warm and dry. Capillary refill takes less than 2 seconds. No rash noted.  Psychiatric: She has a normal mood and affect. Her behavior is normal.  Vitals reviewed.  Dg Chest 2 View  Result Date: 02/26/2017 CLINICAL DATA:  Cough. EXAM: CHEST  2 VIEW COMPARISON:  05/25/2013 FINDINGS: The heart size and mediastinal contours are within normal limits. Both lungs are clear. The visualized skeletal structures are unremarkable. IMPRESSION: No active cardiopulmonary disease. Electronically Signed   By: Francene Boyers M.D.   On: 02/26/2017 12:20     ASSESSMENT & PLAN: [name] was seen today for cough.  Diagnoses and all orders for this visit:  Cough -     DG Chest 2 View; Future  Colon cancer screening -     Ambulatory referral to Gastroenterology  Acute bronchitis, unspecified organism  Other orders -     Cancel: CT VIRTUAL COLONOSCOPY SCREENING; Future -     azithromycin (ZITHROMAX) 250 MG tablet; Sig as indicated -     benzonatate (TESSALON) 200 MG capsule;  Take 1 capsule (200 mg total) by mouth 2 (two) times daily as needed for cough. -     promethazine-codeine (PHENERGAN WITH CODEINE) 6.25-10 MG/5ML syrup; Take 5 mLs by mouth at bedtime as needed for cough.    Patient Instructions       IF you received an x-ray today, you will receive an invoice from [facility]. Please contact [facility] at [phone] with questions or concerns regarding your invoice.   IF you received labwork today, you will receive an invoice from [facility]. Please contact [facility] at [phone] with questions or concerns regarding your invoice.   Our billing staff will not be able to assist you with questions regarding bills from these companies.  You will be contacted with the lab results as soon as they are available. The fastest way to get your results is to activate your My Chart account. Instructions are located on the last page of this paperwork. If you have not heard from [place] regarding the results in 2 weeks, please contact this  office.     Acute Bronchitis, Adult Acute bronchitis is when air tubes (bronchi) in the lungs suddenly get swollen. The condition can make it hard to breathe. It can also cause these symptoms:  A cough.  Coughing up clear, yellow, or green mucus.  Wheezing.  Chest congestion.  Shortness of breath.  A fever.  Body aches.  Chills.  A sore throat.  Follow these instructions at home: Medicines  Take over-the-counter and prescription medicines only as told by your doctor.  If you were prescribed an antibiotic medicine, take it as told by your doctor. Do not stop taking the antibiotic even if you start to feel better. General instructions  Rest.  Drink enough fluids to keep your pee (urine) clear or pale yellow.  Avoid smoking and secondhand smoke. If you smoke and you need help quitting, ask your doctor. Quitting will help your lungs heal faster.  Use an inhaler, cool mist vaporizer, or  humidifier as told by your doctor.  Keep all follow-up visits as told by your doctor. This is important. How is this prevented? To lower your risk of getting this condition again:  Wash your hands often with soap and water. If you cannot use soap and water, use hand sanitizer.  Avoid contact with people who have cold symptoms.  Try not to touch your hands to your mouth, nose, or eyes.  Make sure to get the flu shot every year.  Contact a doctor if:  Your symptoms do not get better in 2 weeks. Get help right away if:  You cough up blood.  You have chest pain.  You have very bad shortness of breath.  You become dehydrated.  You faint (pass out) or keep feeling like you are going to pass out.  You keep throwing up (vomiting).  You have a very bad headache.  Your fever or chills gets worse. This information is not intended to replace advice given to you by your health care provider. Make sure you discuss any questions you have with your health care provider. Document Released: 12/25/2007 Document Revised: 02/14/2016 Document Reviewed: 12/27/2015 Elsevier Interactive Patient Education  2017 Elsevier Inc.     Edwina Barth, MD [facility]

## 2017-03-06 ENCOUNTER — Encounter: Payer: Self-pay | Admitting: Gastroenterology

## 2017-03-06 DIAGNOSIS — J302 Other seasonal allergic rhinitis: Secondary | ICD-10-CM | POA: Diagnosis not present

## 2017-03-06 DIAGNOSIS — M5432 Sciatica, left side: Secondary | ICD-10-CM | POA: Diagnosis not present

## 2017-03-06 DIAGNOSIS — K219 Gastro-esophageal reflux disease without esophagitis: Secondary | ICD-10-CM | POA: Diagnosis not present

## 2017-03-06 DIAGNOSIS — I10 Essential (primary) hypertension: Secondary | ICD-10-CM | POA: Diagnosis not present

## 2017-03-17 ENCOUNTER — Other Ambulatory Visit: Payer: Self-pay | Admitting: Emergency Medicine

## 2017-03-17 DIAGNOSIS — Z124 Encounter for screening for malignant neoplasm of cervix: Secondary | ICD-10-CM

## 2017-04-03 ENCOUNTER — Ambulatory Visit (INDEPENDENT_AMBULATORY_CARE_PROVIDER_SITE_OTHER): Payer: BLUE CROSS/BLUE SHIELD

## 2017-04-03 ENCOUNTER — Ambulatory Visit (INDEPENDENT_AMBULATORY_CARE_PROVIDER_SITE_OTHER): Payer: BLUE CROSS/BLUE SHIELD | Admitting: Specialist

## 2017-04-03 ENCOUNTER — Encounter (INDEPENDENT_AMBULATORY_CARE_PROVIDER_SITE_OTHER): Payer: Self-pay | Admitting: Specialist

## 2017-04-03 VITALS — BP 171/76 | HR 55 | Ht 63.0 in | Wt 176.0 lb

## 2017-04-03 DIAGNOSIS — M5136 Other intervertebral disc degeneration, lumbar region: Secondary | ICD-10-CM | POA: Diagnosis not present

## 2017-04-03 DIAGNOSIS — M48062 Spinal stenosis, lumbar region with neurogenic claudication: Secondary | ICD-10-CM | POA: Diagnosis not present

## 2017-04-03 DIAGNOSIS — G8929 Other chronic pain: Secondary | ICD-10-CM | POA: Diagnosis not present

## 2017-04-03 DIAGNOSIS — M5442 Lumbago with sciatica, left side: Secondary | ICD-10-CM

## 2017-04-03 DIAGNOSIS — M4316 Spondylolisthesis, lumbar region: Secondary | ICD-10-CM | POA: Diagnosis not present

## 2017-04-03 NOTE — Progress Notes (Signed)
Office Visit Note   Patient: Shannon Williamson           Date of Birth: Oct 23, 1960           MRN: [id number] Visit Date: 04/03/2017              Requested by: Fleet Contras, MD [address] [place], [place] [address] PCP: Fleet Contras, MD   Assessment & Plan: Visit Diagnoses:  1. Chronic left-sided low back pain with left-sided sciatica   2. Spondylolisthesis, lumbar region   3. Spinal stenosis of lumbar region with neurogenic claudication   4. Degenerative disc disease, lumbar   5. Other intervertebral disc degeneration, lumbar region   6. Spondylolisthesis of lumbar region     Plan: Avoid bending, stooping and avoid lifting weights greater than 10 lbs. Avoid prolong standing and walking. Avoid frequent bending and stooping  May use ice or moist heat for pain. Weight loss is of benefit. Handicap license is approved. Use cart at grocery shopping, use stationary bicycle for exercise. Pool walking also for exercise. An MRI is ordered and you will be called to make an appointment for this.  Follow-Up Instructions: Return in about 3 weeks (around 04/24/2017).   Orders:  Orders Placed This Encounter  Procedures  . XR Lumbar Spine 2-3 Views  . MR Lumbar Spine w/o contrast   No orders of the defined types were placed in this encounter.     Procedures: No procedures performed   Clinical Data: No additional findings.   Subjective: Chief Complaint  Patient presents with  . Lower Back - Pain    56 year old female For [place], [place], migration to [place] 15 years ago. She has had back pain just over the past 6 months. It was not associated with an injury or fall. She relates that standing or sitting is painful. Pain is worsened with standing or sitting and with picking up items. Some times when she goes to work she has pain with standing all day. 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 She has children that are  21-28, three boys and one girl. The pain is in the posterior left buttock and radiates down into the left calf and left foot dorsolateral left foot. Has pain with coughing or sneezing. She has difficulty with sleeping, hard to turn in bed and to get comfortable. She can walk but can not walk continuously. She has to lean on chair or sit or lean on a buggy at the grocery store. There is numbness in the left  And the right leg but mostly left. No bowel or bladder difficulties. Seen by Dr. Concepcion Elk about a month ago and is referred for evaluation of her spine.      Review of Systems  Constitutional: Positive for activity change and unexpected weight change. Negative for appetite change, chills, diaphoresis, fatigue and fever.  HENT: Negative.  Negative for ear discharge, postnasal drip, sinus pain, sneezing, sore throat, trouble swallowing and voice change.   Eyes: Negative for pain, discharge, redness, itching and visual disturbance.  Respiratory: Negative for cough, choking, chest tightness, shortness of breath and wheezing.   Cardiovascular: Negative for chest pain, palpitations and leg swelling.  Gastrointestinal: Negative for abdominal distention, abdominal pain, anal bleeding, blood in stool, constipation, diarrhea, nausea and vomiting.  Endocrine: Negative for cold intolerance, heat intolerance and polyuria.  Genitourinary: Negative for difficulty urinating, dysuria, flank pain, frequency, genital sores  and hematuria.  Musculoskeletal: Positive for back pain, gait problem and joint swelling. Negative for neck pain and neck stiffness.  Skin: Negative for color change, pallor, rash and wound.  Allergic/Immunologic: Positive for immunocompromised state.  Neurological: Positive for dizziness, weakness and numbness. Negative for tremors, seizures, syncope, facial asymmetry, speech difficulty, light-headedness and headaches.  Hematological: Negative for adenopathy. Does not bruise/bleed easily.    Psychiatric/Behavioral: Positive for dysphoric mood and sleep disturbance. Negative for agitation, behavioral problems, confusion, decreased concentration, hallucinations, self-injury and suicidal ideas. The patient is not nervous/anxious and is not hyperactive.      Objective: Vital Signs: BP (!) 171/76 (BP Location: Left Arm, Patient Position: Sitting)   Pulse (!) 55   Ht 5\' 3"  (1.6 m)   Wt 176 lb (79.8 kg)   BMI 31.18 kg/m   Physical Exam  Constitutional: She is oriented to person, place, and time. She appears well-developed and well-nourished.  HENT:  Head: Normocephalic and atraumatic.  Eyes: Pupils are equal, round, and reactive to light. EOM are normal.  Neck: Normal range of motion. Neck supple.  Pulmonary/Chest: Effort normal and breath sounds normal.  Abdominal: Soft. Bowel sounds are normal.  Neurological: She is alert and oriented to person, place, and time.  Skin: Skin is warm and dry.  Psychiatric: She has a normal mood and affect. Her behavior is normal. Judgment and thought content normal.    Back Exam   Tenderness  The patient is experiencing tenderness in the lumbar.  Range of Motion  Extension:  10 abnormal  Flexion:  80 abnormal  Lateral Bend Right: abnormal  Lateral Bend Left: abnormal  Rotation Right: abnormal  Rotation Left: abnormal   Muscle Strength  Right Quadriceps:  5/5  Left Quadriceps:  5/5  Right Hamstrings:  5/5  Left Hamstrings:  5/5   Tests  Straight leg raise right: negative Straight leg raise left: negative  Reflexes  Patellar:  2/4 normal Achilles: 2/4  Other  Toe Walk: normal Heel Walk: normal Sensation: decreased Gait: normal       Specialty Comments:  No specialty comments available.  Imaging: Xr Lumbar Spine 2-3 Views  Result Date: 04/03/2017 AP and lateral flexion and extension radiographs show a grade 1 spondylolisthesis at L4-5 worsening to Grade 2 with flexion, no definite posterior element lucency.  There is mild posterior L5-S1 degenerative disc changes with narrowing of the posterior disc at L5-S1.      PMFS History: Patient Active Problem List   Diagnosis Date Noted  . Cough 02/26/2017  . Colon cancer screening 02/26/2017  . Acute bronchitis 02/26/2017  . HPV test positive 01/29/2013  . Routine gynecological examination 01/14/2013   Past Medical History:  Diagnosis Date  . Allergy   . Hypertension     No family history on file.  Past Surgical History:  Procedure Laterality Date  . CARPAL TUNNEL RELEASE Right 06/21/2013   Procedure: RIGHT CARPAL TUNNEL RELEASE;  Surgeon: Nicki ReaperGary R Kuzma, MD;  Location: [facility];  Service: Orthopedics;  Laterality: Right;  . NO PAST SURGERIES     Social History   Occupational History  . Not on file.   Social History Main Topics  . Smoking status: Never Smoker  . Smokeless tobacco: Never Used  . Alcohol use No  . Drug use: No  . Sexual activity: Yes

## 2017-04-03 NOTE — Patient Instructions (Signed)
Avoid bending, stooping and avoid lifting weights greater than 10 lbs. Avoid prolong standing and walking. Avoid frequent bending and stooping  May use ice or moist heat for pain. Weight loss is of benefit. Handicap license is approved. Use cart at grocery shopping, use stationary bicycle for exercise. Pool walking also for exercise. An MRI is ordered and you will be called to make an appointment for this.

## 2017-04-16 ENCOUNTER — Encounter: Payer: Self-pay | Admitting: Obstetrics and Gynecology

## 2017-04-17 ENCOUNTER — Ambulatory Visit
Admission: RE | Admit: 2017-04-17 | Discharge: 2017-04-17 | Disposition: A | Discharge status: Home / Self Care | Payer: BLUE CROSS/BLUE SHIELD | Source: Ambulatory Visit | Attending: Specialist | Admitting: Specialist

## 2017-04-17 DIAGNOSIS — M48062 Spinal stenosis, lumbar region with neurogenic claudication: Secondary | ICD-10-CM

## 2017-04-17 DIAGNOSIS — M4316 Spondylolisthesis, lumbar region: Secondary | ICD-10-CM

## 2017-04-17 DIAGNOSIS — M5136 Other intervertebral disc degeneration, lumbar region: Secondary | ICD-10-CM

## 2017-04-17 DIAGNOSIS — M48061 Spinal stenosis, lumbar region without neurogenic claudication: Secondary | ICD-10-CM | POA: Diagnosis not present

## 2017-04-23 ENCOUNTER — Encounter (INDEPENDENT_AMBULATORY_CARE_PROVIDER_SITE_OTHER): Payer: Self-pay | Admitting: Specialist

## 2017-04-23 ENCOUNTER — Ambulatory Visit (INDEPENDENT_AMBULATORY_CARE_PROVIDER_SITE_OTHER): Payer: BLUE CROSS/BLUE SHIELD | Admitting: Specialist

## 2017-04-23 VITALS — BP 167/72 | HR 60 | Ht 63.0 in | Wt 176.0 lb

## 2017-04-23 DIAGNOSIS — M4316 Spondylolisthesis, lumbar region: Secondary | ICD-10-CM

## 2017-04-23 DIAGNOSIS — M48062 Spinal stenosis, lumbar region with neurogenic claudication: Secondary | ICD-10-CM | POA: Diagnosis not present

## 2017-04-23 NOTE — Patient Instructions (Addendum)
Plan: Avoid bending, stooping and avoid lifting weights greater than 10 lbs. Avoid prolong standing and walking. Avoid frequent bending and stooping  No lifting greater than 10 lbs. May use ice or moist heat for pain. Weight loss is of benefit. Handicap license is approved. Options concerning treatment of this condition include, steroid injections that may temporarily improve the pain, exercises and avoidance of prolong standing and walking, Or just live with the pain. Call us when you have made a decision.

## 2017-04-23 NOTE — Progress Notes (Signed)
Office Visit Note   Patient: Shannon Williamson           Date of Birth: November 17, 1960           MRN: [id number] Visit Date: 04/23/2017              Requested by: Fleet Contras, MD [address] [place], [place] [address] PCP: Fleet Contras, MD   Assessment & Plan: Visit Diagnoses:  1. Spinal stenosis of lumbar region with neurogenic claudication   2. Spondylolisthesis of lumbar region     Plan: Avoid bending, stooping and avoid lifting weights greater than 10 lbs. Avoid prolong standing and walking. Avoid frequent bending and stooping  No lifting greater than 10 lbs. May use ice or moist heat for pain. Weight loss is of benefit. Handicap license is approved. Options concerning treatment of this condition include, steroid injections that may temporarily improve the pain, exercises and avoidance of prolong standing and walking, Or just live with the pain. Call us when you have made a decision.  Follow-Up Instructions: No Follow-up on file.   Orders:  No orders of the defined types were placed in this encounter.  No orders of the defined types were placed in this encounter.     Procedures: No procedures performed   Clinical Data: No additional findings.   Subjective: Chief Complaint  Patient presents with  . Lower Back - Follow-up    MRI Review---Lumbar     56 year old female with history of back pain and increasing difficulty with walking. She has been trying to exercise, flexion exercises and is avoiding prolong standing and walking. She did  PT for about 6 weeks and now she is returning to review the recent MRI and also to discuss treatment options.     Review of Systems  Constitutional: Negative.   HENT: Negative.   Eyes: Negative.   Respiratory: Negative.   Cardiovascular: Negative.   Gastrointestinal: Negative.   Endocrine: Negative.   Genitourinary: Negative.   Musculoskeletal: Negative.   Skin: Negative.   Allergic/Immunologic: Negative.     Neurological: Negative.   Hematological: Negative.   Psychiatric/Behavioral: Negative.      Objective: Vital Signs: BP (!) 167/72 (BP Location: Left Arm, Patient Position: Sitting)   Pulse 60   Ht  (1.6 m)   Wt 176 lb (79.8 kg)   BMI 31.18 kg/m   Physical Exam  Constitutional: She is oriented to person, place, and time. She appears well-developed and well-nourished.  HENT:  Head: Normocephalic and atraumatic.  Eyes: Pupils are equal, round, and reactive to light. EOM are normal.  Neck: Normal range of motion. Neck supple.  Pulmonary/Chest: Effort normal and breath sounds normal.  Abdominal: Soft. Bowel sounds are normal.  Neurological: She is alert and oriented to person, place, and time.  Skin: Skin is warm and dry.  Psychiatric: She has a normal mood and affect. Her behavior is normal. Judgment and thought content normal.    Back Exam   Tenderness  The patient is experiencing tenderness in the lumbar.  Range of Motion  Extension: abnormal  Flexion: abnormal  Lateral Bend Right: abnormal  Lateral Bend Left: abnormal  Rotation Right: abnormal  Rotation Left: abnormal   Muscle Strength  Right Quadriceps:  5/5  Left Quadriceps:  5/5  Right Hamstrings:  5/5  Left Hamstrings:  5/5   Tests  Straight leg raise right: negative Straight leg raise left: negative  Reflexes  Patellar: normal Achilles: normal Babinski's sign: normal  Other  Toe Walk: normal Heel Walk: normal Gait: normal  Erythema: no back redness Scars: absent      Specialty Comments:  No specialty comments available.  Imaging: No results found.   PMFS History: Patient Active Problem List   Diagnosis Date Noted  . Cough 02/26/2017  . Colon cancer screening 02/26/2017  . Acute bronchitis 02/26/2017  . HPV test positive 01/29/2013  . Routine gynecological examination 01/14/2013   Past Medical History:  Diagnosis Date  . Allergy   . Hypertension     No family history on  file.  Past Surgical History:  Procedure Laterality Date  . CARPAL TUNNEL RELEASE Right 06/21/2013   Procedure: RIGHT CARPAL TUNNEL RELEASE;  Surgeon: Nicki Reaper, MD;  Location: [facility];  Service: Orthopedics;  Laterality: Right;  . NO PAST SURGERIES     Social History   Occupational History  . Not on file.   Social History Main Topics  . Smoking status: Never Smoker  . Smokeless tobacco: Never Used  . Alcohol use No  . Drug use: No  . Sexual activity: Yes

## 2017-05-13 ENCOUNTER — Encounter: Payer: BLUE CROSS/BLUE SHIELD | Admitting: Gastroenterology

## 2017-05-28 ENCOUNTER — Encounter: Payer: Self-pay | Admitting: Obstetrics

## 2017-05-28 ENCOUNTER — Ambulatory Visit (INDEPENDENT_AMBULATORY_CARE_PROVIDER_SITE_OTHER): Payer: BLUE CROSS/BLUE SHIELD | Admitting: Obstetrics

## 2017-05-28 ENCOUNTER — Other Ambulatory Visit: Payer: Self-pay

## 2017-05-28 ENCOUNTER — Ambulatory Visit (INDEPENDENT_AMBULATORY_CARE_PROVIDER_SITE_OTHER): Payer: BLUE CROSS/BLUE SHIELD | Admitting: Specialist

## 2017-05-28 ENCOUNTER — Encounter (INDEPENDENT_AMBULATORY_CARE_PROVIDER_SITE_OTHER): Payer: Self-pay | Admitting: Specialist

## 2017-05-28 VITALS — BP 170/71 | HR 58 | Ht 62.0 in | Wt 178.0 lb

## 2017-05-28 VITALS — BP 176/73 | HR 62 | Ht 63.0 in | Wt 176.0 lb

## 2017-05-28 DIAGNOSIS — E669 Obesity, unspecified: Secondary | ICD-10-CM

## 2017-05-28 DIAGNOSIS — Z1151 Encounter for screening for human papillomavirus (HPV): Secondary | ICD-10-CM

## 2017-05-28 DIAGNOSIS — I10 Essential (primary) hypertension: Secondary | ICD-10-CM

## 2017-05-28 DIAGNOSIS — Z124 Encounter for screening for malignant neoplasm of cervix: Secondary | ICD-10-CM

## 2017-05-28 DIAGNOSIS — Z01419 Encounter for gynecological examination (general) (routine) without abnormal findings: Secondary | ICD-10-CM

## 2017-05-28 DIAGNOSIS — R87810 Cervical high risk human papillomavirus (HPV) DNA test positive: Secondary | ICD-10-CM

## 2017-05-28 DIAGNOSIS — Z5321 Procedure and treatment not carried out due to patient leaving prior to being seen by health care provider: Secondary | ICD-10-CM

## 2017-05-28 NOTE — Progress Notes (Signed)
Subjective:        Shannon Williamson is a 56 y.o. female here for a routine exam.  Current complaints: None.    Personal health questionnaire:  Is patient Ashkenazi Jewish, have a family history of breast and/or ovarian cancer: no Is there a family history of uterine cancer diagnosed at age < 5250, gastrointestinal cancer, urinary tract cancer, family member who is a Personnel officerLynch syndrome-associated carrier: no Is the patient overweight and hypertensive, family history of diabetes, personal history of gestational diabetes, preeclampsia or PCOS: no Is patient over 5655, have PCOS,  family history of premature CHD under age 55, diabetes, smoke, have hypertension or peripheral artery disease:  no At any time, has a partner hit, kicked or otherwise hurt or frightened you?: no Over the past 2 weeks, have you felt down, depressed or hopeless?: no Over the past 2 weeks, have you felt little interest or pleasure in doing things?:no   Gynecologic History No LMP recorded. Patient is not currently having periods (Reason: Perimenopausal). Contraception: post menopausal status Last Pap: 2014. Results were: Positive HPV Last mammogram: 2018. Results were: normal  Obstetric History OB History  No data available    Past Medical History:  Diagnosis Date  . Allergy   . Hypertension     Past Surgical History:  Procedure Laterality Date  . NO PAST SURGERIES       Current Outpatient Medications:  .  amitriptyline (ELAVIL) 25 MG tablet, Take 25 mg by mouth at bedtime as needed for sleep., Disp: , Rfl:  .  diclofenac (VOLTAREN) 50 MG EC tablet, Take 50 mg by mouth 2 (two) times daily., Disp: , Rfl:  .  Ergocalciferol (VITAMIN D2) 2000 units TABS, Take by mouth., Disp: , Rfl:  .  gabapentin (NEURONTIN) 100 MG capsule, Take 100 mg by mouth 3 (three) times daily., Disp: , Rfl:  .  lisinopril (PRINIVIL,ZESTRIL) 20 MG tablet, Take 20 mg by mouth 2 (two) times daily., Disp: , Rfl:  .  loratadine (CLARITIN)  10 MG tablet, Take 10 mg by mouth daily., Disp: , Rfl:  .  metoprolol (LOPRESSOR) 100 MG tablet, Take 100 mg by mouth daily., Disp: , Rfl:  .  azithromycin (ZITHROMAX) 250 MG tablet, Sig as indicated (Patient not taking: Reported on 05/28/2017), Disp: 6 tablet, Rfl: 0 .  benzonatate (TESSALON) 200 MG capsule, Take 1 capsule (200 mg total) by mouth 2 (two) times daily as needed for cough. (Patient not taking: Reported on 05/28/2017), Disp: 20 capsule, Rfl: 0 .  hydrochlorothiazide (MICROZIDE) 12.5 MG capsule, Take 12.5 mg by mouth daily., Disp: , Rfl:  .  HYDROcodone-acetaminophen (NORCO) 5-325 MG per tablet, Take 1 tablet by mouth every 6 (six) hours as needed for moderate pain. (Patient not taking: Reported on 05/28/2017), Disp: 30 tablet, Rfl: 0 .  HYDROcodone-homatropine (HYCODAN) 5-1.5 MG/5ML syrup, Take 5 mLs by mouth every 6 (six) hours as needed. (Patient not taking: Reported on 05/28/2017), Disp: 120 mL, Rfl: 0 .  lisinopril-hydrochlorothiazide (PRINZIDE,ZESTORETIC) 10-12.5 MG tablet, Take 1 tablet by mouth daily., Disp: , Rfl: 3 .  naproxen (NAPROSYN) 500 MG tablet, Take 500 mg by mouth 2 (two) times daily with a meal., Disp: , Rfl:  .  omeprazole (PRILOSEC) 20 MG capsule, Take 20 mg by mouth daily., Disp: , Rfl:  .  promethazine-codeine (PHENERGAN WITH CODEINE) 6.25-10 MG/5ML syrup, Take 5 mLs by mouth at bedtime as needed for cough., Disp: 120 mL, Rfl: 0 No Known Allergies  Social History  Tobacco Use  . Smoking status: Never Smoker  . Smokeless tobacco: Never Used  Substance Use Topics  . Alcohol use: No    History reviewed. No pertinent family history.    Review of Systems  Constitutional: negative for fatigue and weight loss Respiratory: negative for cough and wheezing Cardiovascular: negative for chest pain, fatigue and palpitations Gastrointestinal: negative for abdominal pain and change in bowel habits Musculoskeletal:negative for myalgias Neurological: negative for gait  problems and tremors Behavioral/Psych: negative for abusive relationship, depression Endocrine: negative for temperature intolerance    Genitourinary:negative for abnormal menstrual periods, genital lesions, hot flashes, sexual problems and vaginal discharge Integument/breast: negative for breast lump, breast tenderness, nipple discharge and skin lesion(s)    Objective:       BP (!) 170/71   Pulse (!) 58   Ht 5\' 2"  (1.575 m)   Wt 178 lb (80.7 kg)   BMI 32.56 kg/m  General:   alert  Skin:   no rash or abnormalities  Lungs:   clear to auscultation bilaterally  Heart:   regular rate and rhythm, S1, S2 normal, no murmur, click, rub or gallop  Breasts:   normal without suspicious masses, skin or nipple changes or axillary nodes  Abdomen:  normal findings: no organomegaly, soft, non-tender and no hernia  Pelvis:  External genitalia: normal general appearance Urinary system: urethral meatus normal and bladder without fullness, nontender Vaginal: normal without tenderness, induration or masses Cervix: normal appearance Adnexa: normal bimanual exam Uterus: anteverted and non-tender, normal size   Lab Review Urine pregnancy test Labs reviewed yes Radiologic studies reviewed yes  50% of 20 min visit spent on counseling and coordination of care.    Assessment:     1. Encounter for routine gynecological examination with Papanicolaou smear of cervix Rx: - Cytology - PAP  2. Cervical high risk HPV (human papillomavirus) test positive - continue yearly pap smears  3. Obesity (BMI 35.0-39.9 without comorbidity) - program including caloric reduction, exercise and behavioral modification recommended  4. HTN (hypertension), benign - managed by PCP   Plan:    Education reviewed: calcium supplements, depression evaluation, low fat, low cholesterol diet, safe sex/STD prevention, self breast exams and weight bearing exercise. Follow up in: 1 year.   No orders of the defined types were  placed in this encounter.  No orders of the defined types were placed in this encounter.

## 2017-05-28 NOTE — Progress Notes (Signed)
AEX/PAP

## 2017-05-30 LAB — CYTOLOGY - PAP
DIAGNOSIS: NEGATIVE
HPV (WINDOPATH): NOT DETECTED

## 2017-06-03 NOTE — Progress Notes (Signed)
Patient cancelled her appointment and she was not seen. Shannon Williamson

## 2017-06-05 DIAGNOSIS — I10 Essential (primary) hypertension: Secondary | ICD-10-CM | POA: Diagnosis not present

## 2017-06-05 DIAGNOSIS — K219 Gastro-esophageal reflux disease without esophagitis: Secondary | ICD-10-CM | POA: Diagnosis not present

## 2017-06-05 DIAGNOSIS — Z23 Encounter for immunization: Secondary | ICD-10-CM | POA: Diagnosis not present

## 2017-06-05 DIAGNOSIS — M5432 Sciatica, left side: Secondary | ICD-10-CM | POA: Diagnosis not present

## 2017-06-19 ENCOUNTER — Encounter (INDEPENDENT_AMBULATORY_CARE_PROVIDER_SITE_OTHER): Payer: Self-pay | Admitting: Specialist

## 2017-07-04 ENCOUNTER — Encounter (INDEPENDENT_AMBULATORY_CARE_PROVIDER_SITE_OTHER): Payer: Self-pay | Admitting: Specialist

## 2017-07-04 ENCOUNTER — Ambulatory Visit (INDEPENDENT_AMBULATORY_CARE_PROVIDER_SITE_OTHER): Payer: BLUE CROSS/BLUE SHIELD | Admitting: Specialist

## 2017-07-04 VITALS — BP 137/74 | HR 64 | Ht 63.0 in | Wt 176.0 lb

## 2017-07-04 DIAGNOSIS — M4316 Spondylolisthesis, lumbar region: Secondary | ICD-10-CM | POA: Diagnosis not present

## 2017-07-04 DIAGNOSIS — M48062 Spinal stenosis, lumbar region with neurogenic claudication: Secondary | ICD-10-CM

## 2017-07-04 MED ORDER — GABAPENTIN 100 MG PO CAPS: 100.0000 mg | ORAL_CAPSULE | Freq: Three times a day (TID) | ORAL | 3 refills | Status: DC

## 2017-07-04 MED ORDER — DICLOFENAC SODIUM 50 MG PO TBEC: 50.0000 mg | DELAYED_RELEASE_TABLET | Freq: Three times a day (TID) | ORAL | 3 refills | Status: DC

## 2017-07-04 NOTE — Patient Instructions (Signed)
Avoid bending, stooping and avoid lifting weights greater than 10 lbs. Avoid prolong standing and walking. Avoid frequent bending and stooping  No lifting greater than 10 lbs. May use ice or moist heat for pain. Weight loss is of benefit. Handicap license is approved. Recommend NSAIDs like motrin, advil, alleve or Ibuprofen for pain. Avoid stronger narcotics as these cause drug tolerance and addiction without Making the condition any better.  Flexion exercises of the lumbar spine.

## 2017-07-04 NOTE — Progress Notes (Signed)
Office Visit Note   Patient: Shannon Williamson           Date of Birth: [birth year]           MRN: [id number] Visit Date: 07/04/2017              Requested by: Fleet ContrasAvbuere, Edwin, MD [address] [place], [place] [id number] PCP: Fleet ContrasAvbuere, Edwin, MD   Assessment & Plan: Visit Diagnoses:  1. Spinal stenosis of lumbar region with neurogenic claudication   2. Spondylolisthesis, lumbar region     Plan: Avoid bending, stooping and avoid lifting weights greater than 10 lbs. Avoid prolong standing and walking. Avoid frequent bending and stooping  No lifting greater than 10 lbs. May use ice or moist heat for pain. Weight loss is of benefit. Handicap license is approved. Recommend NSAIDs like motrin, advil, alleve or Ibuprofen for pain. Avoid stronger narcotics as these cause drug tolerance and addiction without Making the condition any better.  Flexion exercises of the lumbar spine.   Follow-Up Instructions: Return in about 1 year (around 07/04/2018).   Orders:  No orders of the defined types were placed in this encounter.  Meds ordered this encounter  Medications  . diclofenac (VOLTAREN) 50 MG EC tablet    Sig: Take 1 tablet (50 mg total) by mouth 3 (three) times daily.    Dispense:  60 tablet    Refill:  3  . gabapentin (NEURONTIN) 100 MG capsule    Sig: Take 1 capsule (100 mg total) by mouth 3 (three) times daily.    Dispense:  90 capsule    Refill:  3      Procedures: No procedures performed   Clinical Data: No additional findings.   Subjective: Chief Complaint  Patient presents with  . Lower Back - Follow-up    56 year old with lumbar spondylolisthesis and spinal stenosis. 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 Able to perform flexion exercises of the lumbar spine.     Review of Systems  Constitutional: Negative.   HENT: Negative.   Eyes: Negative.   Respiratory: Negative.   Cardiovascular: Negative.     Gastrointestinal: Negative.   Endocrine: Negative.   Genitourinary: Negative.   Musculoskeletal: Negative.   Skin: Negative.   Allergic/Immunologic: Negative.   Neurological: Negative.   Hematological: Negative.   Psychiatric/Behavioral: Negative.      Objective: Vital Signs: BP 137/74 (BP Location: Left Arm, Patient Position: Sitting)   Pulse 64   Ht 5\' 3"  (1.6 m)   Wt 176 lb (79.8 kg)   BMI 31.18 kg/m   Physical Exam  Constitutional: She is oriented to person, place, and time. She appears well-developed and well-nourished.  HENT:  Head: Normocephalic and atraumatic.  Eyes: EOM are normal. Pupils are equal, round, and reactive to light.  Neck: Normal range of motion. Neck supple.  Pulmonary/Chest: Effort normal and breath sounds normal.  Abdominal: Soft. Bowel sounds are normal.  Neurological: She is alert and oriented to person, place, and time.  Skin: Skin is warm and dry.  Psychiatric: She has a normal mood and affect. Her behavior is normal. Judgment and thought content normal.    Back Exam   Tenderness  The patient is experiencing tenderness in the lumbar.  Range of Motion  Extension: abnormal  Flexion: normal  Lateral bend right: abnormal  Lateral bend left: abnormal  Rotation right: abnormal  Rotation left: abnormal   Muscle Strength  Right Quadriceps:  5/5  Left Quadriceps:  5/5  Right Hamstrings:  5/5  Left Hamstrings:  5/5   Tests  Straight leg raise right: negative Straight leg raise left: negative  Reflexes  Patellar: normal Achilles: normal Babinski's sign: normal   Other  Toe walk: normal Heel walk: normal Sensation: normal Gait: normal  Erythema: no back redness Scars: absent      Specialty Comments:  No specialty comments available.  Imaging: No results found.   PMFS History: Patient Active Problem List   Diagnosis Date Noted  . Cough 02/26/2017  . Colon cancer screening 02/26/2017  . Acute bronchitis 02/26/2017   . HPV test positive 01/29/2013  . Routine gynecological examination 01/14/2013   Past Medical History:  Diagnosis Date  . Allergy   . Hypertension     History reviewed. No pertinent family history.  Past Surgical History:  Procedure Laterality Date  . CARPAL TUNNEL RELEASE Right 06/21/2013   Procedure: RIGHT CARPAL TUNNEL RELEASE;  Surgeon: Nicki ReaperGary R Kuzma, MD;  Location: [facility];  Service: Orthopedics;  Laterality: Right;  . NO PAST SURGERIES     Social History   Occupational History  . Not on file  Tobacco Use  . Smoking status: Never Smoker  . Smokeless tobacco: Never Used  Substance and Sexual Activity  . Alcohol use: No  . Drug use: No  . Sexual activity: Yes

## 2017-09-04 DIAGNOSIS — Z13220 Encounter for screening for lipoid disorders: Secondary | ICD-10-CM | POA: Diagnosis not present

## 2017-09-04 DIAGNOSIS — M48062 Spinal stenosis, lumbar region with neurogenic claudication: Secondary | ICD-10-CM | POA: Diagnosis not present

## 2017-09-04 DIAGNOSIS — E559 Vitamin D deficiency, unspecified: Secondary | ICD-10-CM | POA: Diagnosis not present

## 2017-09-04 DIAGNOSIS — I10 Essential (primary) hypertension: Secondary | ICD-10-CM | POA: Diagnosis not present

## 2017-09-04 DIAGNOSIS — Z131 Encounter for screening for diabetes mellitus: Secondary | ICD-10-CM | POA: Diagnosis not present

## 2017-10-16 DIAGNOSIS — K219 Gastro-esophageal reflux disease without esophagitis: Secondary | ICD-10-CM | POA: Diagnosis not present

## 2017-10-16 DIAGNOSIS — M48062 Spinal stenosis, lumbar region with neurogenic claudication: Secondary | ICD-10-CM | POA: Diagnosis not present

## 2017-10-16 DIAGNOSIS — I10 Essential (primary) hypertension: Secondary | ICD-10-CM | POA: Diagnosis not present

## 2017-10-16 DIAGNOSIS — E669 Obesity, unspecified: Secondary | ICD-10-CM | POA: Diagnosis not present

## 2017-10-20 ENCOUNTER — Emergency Department (HOSPITAL_COMMUNITY)
Admission: EM | Admit: 2017-10-20 | Discharge: 2017-10-20 | Disposition: A | Discharge status: Home / Self Care | Payer: BLUE CROSS/BLUE SHIELD | Attending: Emergency Medicine | Admitting: Emergency Medicine

## 2017-10-20 ENCOUNTER — Other Ambulatory Visit (INDEPENDENT_AMBULATORY_CARE_PROVIDER_SITE_OTHER): Payer: Self-pay | Admitting: Specialist

## 2017-10-20 ENCOUNTER — Other Ambulatory Visit: Payer: Self-pay

## 2017-10-20 ENCOUNTER — Encounter (HOSPITAL_COMMUNITY): Payer: Self-pay | Admitting: Emergency Medicine

## 2017-10-20 DIAGNOSIS — M5416 Radiculopathy, lumbar region: Secondary | ICD-10-CM

## 2017-10-20 DIAGNOSIS — Z79899 Other long term (current) drug therapy: Secondary | ICD-10-CM | POA: Insufficient documentation

## 2017-10-20 DIAGNOSIS — I10 Essential (primary) hypertension: Secondary | ICD-10-CM | POA: Insufficient documentation

## 2017-10-20 DIAGNOSIS — M545 Low back pain: Secondary | ICD-10-CM | POA: Diagnosis not present

## 2017-10-20 DIAGNOSIS — R52 Pain, unspecified: Secondary | ICD-10-CM | POA: Diagnosis not present

## 2017-10-20 DIAGNOSIS — M5489 Other dorsalgia: Secondary | ICD-10-CM | POA: Diagnosis not present

## 2017-10-20 MED ORDER — CYCLOBENZAPRINE HCL 10 MG PO TABS: 10.0000 mg | ORAL_TABLET | Freq: Two times a day (BID) | ORAL | 0 refills | Status: DC | PRN

## 2017-10-20 MED ORDER — PREDNISONE 20 MG PO TABS: 40.0000 mg | ORAL_TABLET | Freq: Every day | ORAL | 0 refills | Status: DC

## 2017-10-20 NOTE — ED Provider Notes (Signed)
[facility]-EMERGENCY DEPT Provider Note   CSN: [id number] Arrival date & time: 10/20/17  [id number]     History   Chief Complaint Chief Complaint  Patient presents with  . Back Pain    HPI Shannon Williamson is a 57 y.o. female.  Patient with PMH of spinal stenosis followed by Dr. Otelia SergeantNitka of [facility] presents to the ED with a chief complaint of back pain.  She states that she has chronic low back pain that radiates to the back of her left lower leg and buttock.  She states that the symptoms worsened 2 days ago.  She denies any fever or chills.  Denies numbness or weakness.  Has taken gabapentin and diclofenac with no relief.  Denies dysuria or any problems using the bathroom.  Patient speaks broken [place], husband helps translate.  The history is provided by the patient. No language interpreter was used.    Past Medical History:  Diagnosis Date  . Allergy   . Hypertension     Patient Active Problem List   Diagnosis Date Noted  . Cough 02/26/2017  . Colon cancer screening 02/26/2017  . Acute bronchitis 02/26/2017  . HPV test positive 01/29/2013  . Routine gynecological examination 01/14/2013    Past Surgical History:  Procedure Laterality Date  . CARPAL TUNNEL RELEASE Right 06/21/2013   Procedure: RIGHT CARPAL TUNNEL RELEASE;  Surgeon: Nicki ReaperGary R Kuzma, MD;  Location: [facility];  Service: Orthopedics;  Laterality: Right;  . NO PAST SURGERIES       OB History   None      Home Medications    Prior to Admission medications   Medication Sig Start Date End Date Taking? Authorizing Provider  gabapentin (NEURONTIN) 100 MG capsule Take 1 capsule (100 mg total) by mouth 3 (three) times daily. 07/04/17  Yes Kerrin ChampagneNitka, James E, MD  hydrochlorothiazide (MICROZIDE) 12.5 MG capsule Take 12.5 mg by mouth daily.   Yes [provider]  lisinopril (PRINIVIL,ZESTRIL) 20 MG tablet Take 20 mg by mouth 2 (two) times daily.   Yes [provider]  metoprolol succinate (TOPROL-XL) 100 MG 24 hr tablet Take 100 mg by mouth daily. Take with or immediately following a meal.   Yes [provider]  tiZANidine (ZANAFLEX) 4 MG tablet Take 4 mg by mouth 2 (two) times daily as needed for muscle spasms.   Yes [provider]  Vitamin D, Ergocalciferol, (DRISDOL) 50000 units CAPS capsule Take 50,000 Units by mouth every Tuesday.   Yes [provider]  diclofenac (VOLTAREN) 50 MG EC tablet Take 1 tablet (50 mg total) by mouth 3 (three) times daily. Patient not taking: Reported on 10/20/2017 07/04/17   Kerrin ChampagneNitka, James E, MD    Family History No family history on file.  Social History Social History   Tobacco Use  . Smoking status: Never Smoker  . Smokeless tobacco: Never Used  Substance Use Topics  . Alcohol use: No  . Drug use: No     Allergies   Patient has no known allergies.   Review of Systems Review of Systems  All other systems reviewed and are negative.    Physical Exam Updated Vital Signs SpO2 96%   Physical Exam Physical Exam  Constitutional: Pt appears well-developed and well-nourished. No distress.  HENT:  Head: Normocephalic and atraumatic.  Mouth/Throat: Oropharynx is clear and moist. No oropharyngeal exudate.  Eyes: Conjunctivae are normal.  Neck: Normal range of motion. Neck supple.  No meningismus Cardiovascular:  Normal rate, regular rhythm and intact distal pulses.   Pulmonary/Chest: Effort normal and breath sounds normal. No respiratory distress. Pt has no wheezes.  Abdominal: Pt exhibits no distension Musculoskeletal:  Left lumbar paraspinal muscles tender to palpation, no bony CTLS spine tenderness, deformity, step-off, or crepitus Lymphadenopathy: Pt has no cervical adenopathy.  Neurological: Pt is alert and oriented Speech is clear and goal oriented, follows commands Normal 5/5 strength in upper and lower extremities bilaterally including dorsiflexion and  plantar flexion, strong and equal grip strength Sensation intact Great toe extension intact Moves extremities without ataxia, coordination intact Normal gait Normal balance No Clonus Skin: Skin is warm and dry. No rash noted. Pt is not diaphoretic. No erythema.  Psychiatric: Pt has a normal mood and affect. Behavior is normal.  Nursing note and vitals reviewed.   ED Treatments / Results  Labs (all labs ordered are listed, but only abnormal results are displayed) Labs Reviewed - No data to display  EKG None  Radiology No results found.  Procedures Procedures (including critical care time)  Medications Ordered in ED Medications - No data to display   Initial Impression / Assessment and Plan / ED Course  I have reviewed the triage vital signs and the nursing notes.  Pertinent labs & imaging results that were available during my care of the patient were reviewed by me and considered in my medical decision making (see chart for details).     Patient with back pain.    No neurological deficits and normal neuro exam.  Patient is ambulatory.  No loss of bowel or bladder control.  Doubt cauda equina.  Denies fever,  doubt epidural abscess or other lesion. Recommend back exercises, stretching, RICE, and will treat with a short course of prednisone and flexeril.  Encouraged the patient that there could be a need for additional workup and/or imaging such as MRI, if the symptoms do not resolve. Patient advised that if the back pain does not resolve, or radiates, this could progress to more serious conditions and is encouraged to follow-up with PCP or orthopedics within 2 weeks.     Final Clinical Impressions(s) / ED Diagnoses   Final diagnoses:  Lumbar radiculopathy    ED Discharge Orders        Ordered    predniSONE (DELTASONE) 20 MG tablet  Daily     10/20/17 0521    cyclobenzaprine (FLEXERIL) 10 MG tablet  2 times daily PRN     10/20/17 0521       Roxy Horseman,  PA-C 10/20/17 [id number]    Zadie Rhine, MD 10/20/17 [phone]

## 2017-10-20 NOTE — ED Triage Notes (Signed)
Pt arriving from home with back pain. Back pain is chronic but pt states it has gotten worse. Pt has medications that she takes at home to manage the pain.

## 2017-10-20 NOTE — Telephone Encounter (Signed)
Diclofenac Refill Request    

## 2017-10-23 ENCOUNTER — Ambulatory Visit (INDEPENDENT_AMBULATORY_CARE_PROVIDER_SITE_OTHER): Payer: BLUE CROSS/BLUE SHIELD

## 2017-10-23 ENCOUNTER — Encounter (INDEPENDENT_AMBULATORY_CARE_PROVIDER_SITE_OTHER): Payer: Self-pay | Admitting: Specialist

## 2017-10-23 ENCOUNTER — Ambulatory Visit (INDEPENDENT_AMBULATORY_CARE_PROVIDER_SITE_OTHER): Payer: BLUE CROSS/BLUE SHIELD | Admitting: Specialist

## 2017-10-23 VITALS — BP 139/60 | HR 69 | Ht 63.0 in | Wt 176.0 lb

## 2017-10-23 DIAGNOSIS — M4316 Spondylolisthesis, lumbar region: Secondary | ICD-10-CM

## 2017-10-23 DIAGNOSIS — M48062 Spinal stenosis, lumbar region with neurogenic claudication: Secondary | ICD-10-CM

## 2017-10-23 MED ORDER — TRAMADOL HCL 50 MG PO TABS: 50.0000 mg | ORAL_TABLET | Freq: Four times a day (QID) | ORAL | 0 refills | Status: DC | PRN

## 2017-10-23 NOTE — Progress Notes (Signed)
Office Visit Note   Patient: Shannon Williamson           Date of Birth: [birth year]           MRN: [id number] Visit Date: 10/23/2017              Requested by: Fleet Contras, MD [address] [place], [place] [address] PCP: Fleet Contras, MD   Assessment & Plan: Visit Diagnoses:  1. Spinal stenosis of lumbar region with neurogenic claudication   2. Spondylolisthesis of lumbar region     Plan:Avoid bending, stooping and avoid lifting weights greater than 10 lbs. Avoid prolong standing and walking. Avoid frequent bending and stooping  No lifting greater than 10 lbs. May use ice or moist heat for pain. Weight loss is of benefit. Handicap license is approved. Call us if you would like to arrange for epidural steroid injection   Follow-Up Instructions: Return in about 1 month (around 11/20/2017).   Orders:  Orders Placed This Encounter  Procedures  . XR Lumbar Spine 2-3 Views   No orders of the defined types were placed in this encounter.     Procedures: No procedures performed   Clinical Data: No additional findings.   Subjective: Chief Complaint  Patient presents with  . Lower Back - Pain    57 year old female with back pain and radiation into the left leg, pain worsened starting on Sunday with radiation into the left leg, left posterior thigh and posterolateral calf, not into the left foot. Numbness is present with tingling sensation and sharp pain. Increases with any postiion, is having to squirm a lot and is having problems with sleeping. She went to the ER on Monday 10/20/2017 and was seen, at the ER she was continued on gabapentin and diclofenac. Started a steroid dose pak and flexeril. She has pain with walking and Difficulty trying to get up to go to the bathroom.    Review of Systems  Constitutional: Negative.  Negative for activity change, appetite change, chills, diaphoresis, fatigue, fever and unexpected weight change.  HENT: Negative for congestion,  dental problem, drooling, postnasal drip, rhinorrhea, sinus pain, sneezing, sore throat, tinnitus, trouble swallowing and voice change.   Eyes: Negative.  Negative for photophobia, pain, discharge, redness and itching.  Respiratory: Negative.  Negative for apnea, cough, choking, chest tightness, shortness of breath, wheezing and stridor.   Cardiovascular: Negative.  Negative for chest pain, palpitations and leg swelling.  Gastrointestinal: Negative.  Negative for abdominal distention, abdominal pain, anal bleeding, blood in stool, constipation, diarrhea, nausea, rectal pain and vomiting.  Endocrine: Negative.   Genitourinary: Negative for difficulty urinating, dyspareunia, dysuria, enuresis, flank pain, frequency, genital sores and hematuria.  Musculoskeletal: Positive for arthralgias and back pain. Negative for gait problem, joint swelling, myalgias, neck pain and neck stiffness.  Skin: Negative.  Negative for color change, pallor, rash and wound.  Allergic/Immunologic: Positive for environmental allergies. Negative for food allergies.  Neurological: Positive for weakness and numbness. Negative for dizziness, tremors, seizures, syncope, facial asymmetry, light-headedness and headaches.  Hematological: Negative.  Negative for adenopathy. Does not bruise/bleed easily.  Psychiatric/Behavioral: Negative.  Negative for agitation, behavioral problems, confusion, decreased concentration, dysphoric mood, hallucinations, self-injury, sleep disturbance and suicidal ideas. The patient is not nervous/anxious and is not hyperactive.      Objective: Vital Signs: BP 139/60 (BP Location: Left Arm, Patient Position: Sitting)   Pulse 69   Ht 5\' 3"  (1.6 m)   Wt 176 lb (79.8 kg)   BMI  31.18 kg/m   Physical Exam  Constitutional: She is oriented to person, place, and time. She appears well-developed and well-nourished.  HENT:  Head: Normocephalic and atraumatic.  Eyes: Pupils are equal, round, and reactive to  light. EOM are normal.  Neck: Normal range of motion. Neck supple.  Pulmonary/Chest: Effort normal and breath sounds normal.  Abdominal: Soft. Bowel sounds are normal.  Neurological: She is alert and oriented to person, place, and time.  Skin: Skin is warm and dry.  Psychiatric: She has a normal mood and affect. Her behavior is normal. Judgment and thought content normal.    Back Exam   Tenderness  The patient is experiencing tenderness in the lumbar.  Range of Motion  Extension: abnormal  Flexion: normal  Lateral bend right: normal  Lateral bend left: normal  Rotation right: normal  Rotation left: normal   Muscle Strength  Right Quadriceps:  5/5  Left Quadriceps:  5/5  Right Hamstrings:  5/5  Left Hamstrings:  5/5   Tests  Straight leg raise right: negative Straight leg raise left: negative  Reflexes  Patellar: normal Achilles: abnormal Biceps: normal Babinski's sign: normal   Other  Toe walk: normal Heel walk: normal Sensation: normal Gait: normal  Erythema: no back redness Scars: absent  Comments:  Left EHl 4/5, Left foot DF 4/5, Numbness left posterior calf.       Specialty Comments:  No specialty comments available.  Imaging: No results found.   PMFS History: Patient Active Problem List   Diagnosis Date Noted  . Cough 02/26/2017  . Colon cancer screening 02/26/2017  . Acute bronchitis 02/26/2017  . HPV test positive 01/29/2013  . Routine gynecological examination 01/14/2013   Past Medical History:  Diagnosis Date  . Allergy   . Hypertension     History reviewed. No pertinent family history.  Past Surgical History:  Procedure Laterality Date  . CARPAL TUNNEL RELEASE Right 06/21/2013   Procedure: RIGHT CARPAL TUNNEL RELEASE;  Surgeon: Nicki ReaperGary R Kuzma, MD;  Location: [facility];  Service: Orthopedics;  Laterality: Right;  . NO PAST SURGERIES     Social History   Occupational History  . Not on file  Tobacco Use  .  Smoking status: Never Smoker  . Smokeless tobacco: Never Used  Substance and Sexual Activity  . Alcohol use: No  . Drug use: No  . Sexual activity: Yes

## 2017-10-23 NOTE — Patient Instructions (Addendum)
Avoid bending, stooping and avoid lifting weights greater than 10 lbs. Avoid prolong standing and walking. Avoid frequent bending and stooping  No lifting greater than 10 lbs. May use ice or moist heat for pain. Weight loss is of benefit. Handicap license is approved. Call us if you would like to arrange for epidural steroid injection

## 2017-11-21 ENCOUNTER — Encounter (INDEPENDENT_AMBULATORY_CARE_PROVIDER_SITE_OTHER): Payer: Self-pay | Admitting: Specialist

## 2017-11-21 ENCOUNTER — Ambulatory Visit (INDEPENDENT_AMBULATORY_CARE_PROVIDER_SITE_OTHER): Payer: BLUE CROSS/BLUE SHIELD | Admitting: Specialist

## 2017-11-21 VITALS — BP 143/74 | HR 67 | Ht 63.0 in | Wt 170.0 lb

## 2017-11-21 DIAGNOSIS — M48062 Spinal stenosis, lumbar region with neurogenic claudication: Secondary | ICD-10-CM

## 2017-11-21 DIAGNOSIS — M4316 Spondylolisthesis, lumbar region: Secondary | ICD-10-CM | POA: Diagnosis not present

## 2017-11-21 MED ORDER — TRAMADOL HCL 50 MG PO TABS: 50.0000 mg | ORAL_TABLET | Freq: Four times a day (QID) | ORAL | 0 refills | Status: DC | PRN

## 2017-11-21 NOTE — Progress Notes (Addendum)
Office Visit Note   Patient: Shannon Williamson           Date of Birth: 1961-02-28           MRN: [id number] Visit Date: 11/21/2017              Requested by: Fleet Contras, MD [address] [place], [place] [address] PCP: Fleet Contras, MD   Assessment & Plan: Visit Diagnoses:  1. Spinal stenosis of lumbar region with neurogenic claudication   2. Spondylolisthesis, lumbar region     Plan:Avoid bending, stooping and avoid lifting weights greater than 10 lbs. Avoid prolong standing and walking. Order for a new walker with wheels. Surgery scheduling secretary Tivis Ringer, will call you in the next week to schedule for surgery.  Surgery recommended is a one level lumbar fusion 4-5 this would be done with rods, screws and cages with local bone graft and allograft (donor bone graft). Take tramadol for for pain. Risk of surgery includes risk of infection 1 in 200 patients, bleeding 1/2% chance you would need a transfusion.   Risk to the nerves is one in 10,000. You will need to use a brace for 3 months and wean from the brace on the 4th month. Expect improved walking and standing tolerance. Expect relief of leg pain but numbness may persist depending on the length and degree of pressure that has been present. She is returning to [place] for one month then will return in early August to discuss Considering surgery.  Follow-Up Instructions: Return in about 3 years (around 11/21/2020).   Orders:  No orders of the defined types were placed in this encounter.  Meds ordered this encounter  Medications  . traMADol (ULTRAM) 50 MG tablet    Sig: Take 1 tablet (50 mg total) by mouth every 6 (six) hours as needed.    Dispense:  40 tablet    Refill:  0      Procedures: No procedures performed   Clinical Data: No additional findings.   Subjective: Chief Complaint  Patient presents with  . Lower Back - Follow-up, Pain    57 year old female with history of back pain and  radiation into the left leg greater than right side. No bowel or bladder difficulty. She did Pt last year without help and she is taking  Diclofenac and gabapentin for pain. She is having difficulty with walking more than grocery shopping, she is able to do house hold chores but has to sit down intermittantly.  She is able to sleep okay sometimes and it is okay now. On scale of 1-10 it is an "8-9".    Review of Systems  Constitutional: Negative.   HENT: Negative.   Eyes: Negative.   Respiratory: Negative.   Cardiovascular: Negative.   Gastrointestinal: Negative.   Endocrine: Negative.   Genitourinary: Negative.   Musculoskeletal: Negative.   Skin: Negative.   Allergic/Immunologic: Negative.   Neurological: Negative.   Hematological: Negative.   Psychiatric/Behavioral: Negative.      Objective: Vital Signs: BP (!) 143/74 (BP Location: Left Arm, Patient Position: Sitting, Cuff Size: Normal)   Pulse 67   Ht  (1.6 m)   Wt 170 lb (77.1 kg)   BMI 30.11 kg/m   Physical Exam  Constitutional: She is oriented to person, place, and time. She appears well-developed and well-nourished.  HENT:  Head: Normocephalic and atraumatic.  Eyes: Pupils are equal, round, and reactive to light. EOM are normal.  Neck: Normal range of motion.  Neck supple.  Pulmonary/Chest: Effort normal and breath sounds normal.  Abdominal: Soft. Bowel sounds are normal.  Neurological: She is alert and oriented to person, place, and time.  Skin: Skin is warm and dry.  Psychiatric: She has a normal mood and affect. Her behavior is normal. Judgment and thought content normal.    Back Exam   Tenderness  The patient is experiencing tenderness in the lumbar.  Range of Motion  Extension:  10 abnormal  Flexion: 60  Lateral bend right: abnormal  Lateral bend left: abnormal  Rotation right: abnormal  Rotation left: abnormal   Muscle Strength  Right Quadriceps:  5/5  Left Quadriceps:  5/5  Right  Hamstrings:  5/5  Left Hamstrings:  5/5   Tests  Straight leg raise right: negative Straight leg raise left: negative  Reflexes  Patellar: normal Achilles: normal Babinski's sign: normal   Other  Toe walk: normal Heel walk: normal Sensation: normal Gait: normal  Erythema: no back redness Scars: absent      Specialty Comments:  No specialty comments available.  Imaging: No results found.   PMFS History: Patient Active Problem List   Diagnosis Date Noted  . Cough 02/26/2017  . Colon cancer screening 02/26/2017  . Acute bronchitis 02/26/2017  . HPV test positive 01/29/2013  . Routine gynecological examination 01/14/2013   Past Medical History:  Diagnosis Date  . Allergy   . Hypertension     History reviewed. No pertinent family history.  Past Surgical History:  Procedure Laterality Date  . CARPAL TUNNEL RELEASE Right 06/21/2013   Procedure: RIGHT CARPAL TUNNEL RELEASE;  Surgeon: Nicki Reaper, MD;  Location: [facility];  Service: Orthopedics;  Laterality: Right;  . NO PAST SURGERIES     Social History   Occupational History  . Not on file  Tobacco Use  . Smoking status: Never Smoker  . Smokeless tobacco: Never Used  Substance and Sexual Activity  . Alcohol use: No  . Drug use: No  . Sexual activity: Yes

## 2017-11-21 NOTE — Patient Instructions (Addendum)
Avoid bending, stooping and avoid lifting weights greater than 10 lbs. Avoid prolong standing and walking. Order for a new walker with wheels. Surgery scheduling secretary Tivis Ringer, will call you in the next week to schedule for surgery.  Surgery recommended is a one level lumbar fusion 4-5 this would be done with rods, screws and cages with local bone graft and allograft (donor bone graft). Take tramadol for for pain. Risk of surgery includes risk of infection 1 in 200 patients, bleeding 1/2% chance you would need a transfusion.   Risk to the nerves is one in 10,000. You will need to use a brace for 3 months and wean from the brace on the 4th month. Expect improved walking and standing tolerance. Expect relief of leg pain but numbness may persist depending on the length and degree of pressure that has been present. She is returning to [place] for one month then will return in early August to discuss Considering surgery.

## 2017-11-28 ENCOUNTER — Other Ambulatory Visit: Payer: Self-pay | Admitting: Internal Medicine

## 2017-11-28 DIAGNOSIS — Z1231 Encounter for screening mammogram for malignant neoplasm of breast: Secondary | ICD-10-CM

## 2017-12-22 ENCOUNTER — Encounter: Payer: Self-pay | Admitting: Radiology

## 2017-12-22 ENCOUNTER — Ambulatory Visit
Admission: RE | Admit: 2017-12-22 | Discharge: 2017-12-22 | Disposition: A | Discharge status: Home / Self Care | Payer: BLUE CROSS/BLUE SHIELD | Source: Ambulatory Visit | Attending: Internal Medicine | Admitting: Internal Medicine

## 2017-12-22 DIAGNOSIS — Z1231 Encounter for screening mammogram for malignant neoplasm of breast: Secondary | ICD-10-CM | POA: Diagnosis not present

## 2018-02-23 ENCOUNTER — Ambulatory Visit (INDEPENDENT_AMBULATORY_CARE_PROVIDER_SITE_OTHER): Payer: BLUE CROSS/BLUE SHIELD | Admitting: Emergency Medicine

## 2018-02-23 ENCOUNTER — Encounter: Payer: Self-pay | Admitting: Emergency Medicine

## 2018-02-23 ENCOUNTER — Other Ambulatory Visit: Payer: Self-pay

## 2018-02-23 VITALS — BP 147/76 | HR 102 | Temp 102.5°F | Resp 16 | Ht 63.5 in | Wt 168.2 lb

## 2018-02-23 DIAGNOSIS — R52 Pain, unspecified: Secondary | ICD-10-CM | POA: Diagnosis not present

## 2018-02-23 DIAGNOSIS — B349 Viral infection, unspecified: Secondary | ICD-10-CM | POA: Insufficient documentation

## 2018-02-23 DIAGNOSIS — R509 Fever, unspecified: Secondary | ICD-10-CM

## 2018-02-23 DIAGNOSIS — R109 Unspecified abdominal pain: Secondary | ICD-10-CM | POA: Diagnosis not present

## 2018-02-23 LAB — POCT CBC
Granulocyte percent: 78.5 %G (ref 37–80)
HCT, POC: 36.7 % — AB (ref 37.7–47.9)
HEMOGLOBIN: 11.5 g/dL — AB (ref 12.2–16.2)
Lymph, poc: 1.3 (ref 0.6–3.4)
MCH: 28.4 pg (ref 27–31.2)
MCHC: 31.3 g/dL — AB (ref 31.8–35.4)
MCV: 90.8 fL (ref 80–97)
MID (cbc): 0.5 (ref 0–0.9)
MPV: 8.3 fL (ref 0–99.8)
PLATELET COUNT, POC: 109 10*3/uL — AB (ref 142–424)
POC Granulocyte: 6.4 (ref 2–6.9)
POC LYMPH PERCENT: 16 %L (ref 10–50)
POC MID %: 5.5 %M (ref 0–12)
RBC: 4.04 M/uL (ref 4.04–5.48)
RDW, POC: 14.2 %
WBC: 8.2 10*3/uL (ref 4.6–10.2)

## 2018-02-23 LAB — COMPREHENSIVE METABOLIC PANEL
ALK PHOS: 84 IU/L (ref 39–117)
ALT: 23 IU/L (ref 0–32)
AST: 26 IU/L (ref 0–40)
Albumin/Globulin Ratio: 1.3 (ref 1.2–2.2)
Albumin: 3.9 g/dL (ref 3.5–5.5)
BILIRUBIN TOTAL: 1.9 mg/dL — AB (ref 0.0–1.2)
BUN / CREAT RATIO: 14 (ref 9–23)
BUN: 15 mg/dL (ref 6–24)
CHLORIDE: 97 mmol/L (ref 96–106)
CO2: 22 mmol/L (ref 20–29)
Calcium: 9.3 mg/dL (ref 8.7–10.2)
Creatinine, Ser: 1.05 mg/dL — ABNORMAL HIGH (ref 0.57–1.00)
GFR calc Af Amer: 69 mL/min/{1.73_m2} (ref >59)
GFR calc non Af Amer: 60 mL/min/{1.73_m2} (ref >59)
GLUCOSE: 139 mg/dL — AB (ref 65–99)
Globulin, Total: 3.1 g/dL (ref 1.5–4.5)
Potassium: 3.9 mmol/L (ref 3.5–5.2)
Sodium: 134 mmol/L (ref 134–144)
Total Protein: 7 g/dL (ref 6.0–8.5)

## 2018-02-23 LAB — POCT URINALYSIS DIP (MANUAL ENTRY)
Glucose, UA: NEGATIVE mg/dL
LEUKOCYTES UA: NEGATIVE
Nitrite, UA: NEGATIVE
PH UA: 5 (ref 5.0–8.0)
UROBILINOGEN UA: 0.2 U/dL

## 2018-02-23 LAB — GLUCOSE, POCT (MANUAL RESULT ENTRY): POC GLUCOSE: 142 mg/dL — AB (ref 70–99)

## 2018-02-23 NOTE — Progress Notes (Signed)
Shannon Williamson 57 y.o.   Chief Complaint  Patient presents with  . Generalized Body Aches    x 2 days with fever today 102.0 degrees  . Abdominal Pain    HISTORY OF PRESENT ILLNESS: This is a 57 y.o. female complaining of 2-day history of fever and generalized body aches along with intermittent crampy abdominal pain.  Denies URI symptoms denies cough or sore throat.  Denies nausea or vomiting.  Denies diarrhea.  Denies urinary symptoms.  Denies flank pain.  No other significant symptoms.  HPI   Prior to Admission medications   Medication Sig Start Date End Date Taking? Authorizing Provider  cyclobenzaprine (FLEXERIL) 10 MG tablet Take 1 tablet (10 mg total) by mouth 2 (two) times daily as needed for muscle spasms. 10/20/17  Yes Roxy Horseman, PA-C  diclofenac (VOLTAREN) 50 MG EC tablet TAKE 1 TABLET BY MOUTH THREE TIMES A DAY 10/21/17  Yes Kerrin Champagne, MD  hydrochlorothiazide (MICROZIDE) 12.5 MG capsule Take 12.5 mg by mouth daily.   Yes [provider]  lisinopril (PRINIVIL,ZESTRIL) 20 MG tablet Take 20 mg by mouth 2 (two) times daily.   Yes [provider]  metoprolol succinate (TOPROL-XL) 100 MG 24 hr tablet Take 100 mg by mouth daily. Take with or immediately following a meal.   Yes [provider]  tiZANidine (ZANAFLEX) 4 MG tablet Take 4 mg by mouth 2 (two) times daily as needed for muscle spasms.   Yes [provider]  traMADol (ULTRAM) 50 MG tablet Take 1 tablet (50 mg total) by mouth every 6 (six) hours as needed. 11/21/17  Yes Kerrin Champagne, MD  Vitamin D, Ergocalciferol, (DRISDOL) 50000 units CAPS capsule Take 50,000 Units by mouth every Tuesday.   Yes [provider]  gabapentin (NEURONTIN) 100 MG capsule Take 1 capsule (100 mg total) by mouth 3 (three) times daily. Patient not taking: Reported on 02/23/2018 07/04/17   Kerrin Champagne, MD  predniSONE (DELTASONE) 20 MG tablet Take 2 tablets (40 mg total) by mouth daily. Take 40 mg  by mouth daily for 3 days, then 20mg  by mouth daily for 3 days, then 10mg  daily for 3 days Patient not taking: Reported on 02/23/2018 10/20/17   Roxy Horseman, PA-C    No Known Allergies  Patient Active Problem List   Diagnosis Date Noted  . Acute bronchitis 02/26/2017    Past Medical History:  Diagnosis Date  . Allergy   . Hypertension     Past Surgical History:  Procedure Laterality Date  . CARPAL TUNNEL RELEASE Right 06/21/2013   Procedure: RIGHT CARPAL TUNNEL RELEASE;  Surgeon: Nicki Reaper, MD;  Location: [facility];  Service: Orthopedics;  Laterality: Right;  . NO PAST SURGERIES      Social History   Socioeconomic History  . Marital status: Married    Spouse name: Not on file  . Number of children: Not on file  . Years of education: Not on file  . Highest education level: Not on file  Occupational History  . Not on file  Social Needs  . Financial resource strain: Not on file  . Food insecurity:    Worry: Not on file    Inability: Not on file  . Transportation needs:    Medical: Not on file    Non-medical: Not on file  Tobacco Use  . Smoking status: Never Smoker  . Smokeless tobacco: Never Used  Substance and Sexual Activity  . Alcohol use: No  .  Drug use: No  . Sexual activity: Yes  Lifestyle  . Physical activity:    Days per week: Not on file    Minutes per session: Not on file  . Stress: Not on file  Relationships  . Social connections:    Talks on phone: Not on file    Gets together: Not on file    Attends religious service: Not on file    Active member of club or organization: Not on file    Attends meetings of clubs or organizations: Not on file    Relationship status: Not on file  . Intimate partner violence:    Fear of current or ex partner: Not on file    Emotionally abused: Not on file    Physically abused: Not on file    Forced sexual activity: Not on file  Other Topics Concern  . Not on file  Social History Narrative    . Not on file    No family history on file.   Review of Systems  Constitutional: Positive for fever and malaise/fatigue. Negative for chills.  HENT: Negative.  Negative for congestion, ear discharge, ear pain, hearing loss, sinus pain and sore throat.   Eyes: Negative.  Negative for discharge and redness.  Respiratory: Negative.  Negative for cough, sputum production and shortness of breath.   Cardiovascular: Negative.  Negative for chest pain and palpitations.  Gastrointestinal: Negative.  Negative for abdominal pain, blood in stool, diarrhea, nausea and vomiting.  Genitourinary: Negative.  Negative for dysuria, flank pain, hematuria and urgency.  Musculoskeletal: Negative.  Negative for back pain, myalgias and neck pain.  Skin: Negative for rash.  Neurological: Negative for dizziness and headaches.  Endo/Heme/Allergies: Negative.   All other systems reviewed and are negative.    Vitals:   02/23/18 1038  BP: (!) 147/76  Pulse: (!) 102  Resp: 16  Temp: (!) 102.5 F (39.2 C)  SpO2: 95%     Physical Exam  Constitutional: She is oriented to person, place, and time. She appears well-developed.  HENT:  Head: Normocephalic and atraumatic.  Right Ear: Tympanic membrane and external ear normal.  Left Ear: Tympanic membrane and external ear normal.  Nose: Nose normal.  Mouth/Throat: Oropharynx is clear and moist.  Eyes: Pupils are equal, round, and reactive to light. Conjunctivae and EOM are normal.  Neck: Normal range of motion. Neck supple. No thyromegaly present.  Cardiovascular: Normal rate, regular rhythm, normal heart sounds and intact distal pulses.  Pulmonary/Chest: Effort normal and breath sounds normal.  Abdominal: Soft. Bowel sounds are normal. She exhibits no distension and no mass. There is no tenderness. There is no rebound and no guarding.  Musculoskeletal: Normal range of motion. She exhibits no edema or tenderness.  Lymphadenopathy:    She has no cervical  adenopathy.  Neurological: She is alert and oriented to person, place, and time. No cranial nerve deficit or sensory deficit. She exhibits normal muscle tone. Coordination normal.  Skin: Skin is warm and dry. Capillary refill takes less than 2 seconds. No rash noted.  Psychiatric: She has a normal mood and affect. Her behavior is normal.  Vitals reviewed.    Results for orders placed or performed in visit on 02/23/18 (from the past 24 hour(s))  POCT urinalysis dipstick     Status: Abnormal   Collection Time: 02/23/18 10:51 AM  Result Value Ref Range   Color, UA yellow yellow   Clarity, UA clear clear   Glucose, UA negative negative mg/dL  Bilirubin, UA small (A) negative   Ketones, POC UA trace (5) (A) negative mg/dL   Spec Grav, UA >=6.578 (A) 1.010 - 1.025   Blood, UA small (A) negative   pH, UA 5.0 5.0 - 8.0   Protein Ur, POC =100 (A) negative mg/dL   Urobilinogen, UA 0.2 0.2 or 1.0 E.U./dL   Nitrite, UA Negative Negative   Leukocytes, UA Negative Negative  POCT CBC     Status: Abnormal   Collection Time: 02/23/18 11:30 AM  Result Value Ref Range   WBC 8.2 4.6 - 10.2 K/uL   Lymph, poc 1.3 0.6 - 3.4   POC LYMPH PERCENT 16.0 10 - 50 %L   MID (cbc) 0.5 0 - 0.9   POC MID % 5.5 0 - 12 %M   POC Granulocyte 6.4 2 - 6.9   Granulocyte percent 78.5 37 - 80 %G   RBC 4.04 4.04 - 5.48 M/uL   Hemoglobin 11.5 (A) 12.2 - 16.2 g/dL   HCT, POC 46.9 (A) 62.9 - 47.9 %   MCV 90.8 80 - 97 fL   MCH, POC 28.4 27 - 31.2 pg   MCHC 31.3 (A) 31.8 - 35.4 g/dL   RDW, POC 52.8 %   Platelet Count, POC 109 (A) 142 - 424 K/uL   MPV 8.3 0 - 99.8 fL  POCT glucose (manual entry)     Status: Abnormal   Collection Time: 02/23/18 11:32 AM  Result Value Ref Range   POC Glucose 142 (A) 70 - 99 mg/dl   A total of 40 minutes was spent in the room with the patient, greater than 50% of which was in counseling/coordination of care regarding differential diagnosis, treatment, medications, prognosis, and need for  follow-up.   Fever No obvious source of infection.  No red flag signs or symptoms.  CBC and urinalysis unremarkable.  Most likely a viral illness.  Will monitor closely.  Advised to continue taking Advil every 6-8 hours.  Follow-up with me in 2 to 3 days.   ASSESSMENT & PLAN: Dalesha was seen today for generalized body aches and abdominal pain.  Diagnoses and all orders for this visit:  Fever, unspecified fever cause -     POCT CBC -     POCT glucose (manual entry) -     Comprehensive metabolic panel -     Urine Culture  Abdominal pain, unspecified abdominal location -     POCT urinalysis dipstick  Generalized body aches  Viral illness    Patient Instructions       IF you received an x-ray today, you will receive an invoice from [facility]. Please contact [facility] at [phone] with questions or concerns regarding your invoice.   IF you received labwork today, you will receive an invoice from [facility]. Please contact [facility] at [phone] with questions or concerns regarding your invoice.   Our billing staff will not be able to assist you with questions regarding bills from these companies.  You will be contacted with the lab results as soon as they are available. The fastest way to get your results is to activate your My Chart account. Instructions are located on the last page of this paperwork. If you have not heard from [place] regarding the results in 2 weeks, please contact this office.     Viral Illness, Adult Viruses are tiny germs that can get into a person's body and cause illness. There are many different types of viruses, and they cause many types  of illness. Viral illnesses can range from mild to severe. They can affect various parts of the body. Common illnesses that are caused by a virus include colds and the flu. Viral illnesses also include serious conditions such as HIV/AIDS (human immunodeficiency virus/acquired immunodeficiency  syndrome). A few viruses have been linked to certain cancers. What are the causes? Many types of viruses can cause illness. Viruses invade cells in your body, multiply, and cause the infected cells to malfunction or die. When the cell dies, it releases more of the virus. When this happens, you develop symptoms of the illness, and the virus continues to spread to other cells. If the virus takes over the function of the cell, it can cause the cell to divide and grow out of control, as is the case when a virus causes cancer. Different viruses get into the body in different ways. You can get a virus by:  Swallowing food or water that is contaminated with the virus.  Breathing in droplets that have been coughed or sneezed into the air by an infected person.  Touching a surface that has been contaminated with the virus and then touching your eyes, nose, or mouth.  Being bitten by an insect or animal that carries the virus.  Having sexual contact with a person who is infected with the virus.  Being exposed to blood or fluids that contain the virus, either through an open cut or during a transfusion.  If a virus enters your body, your body's defense system (immune system) will try to fight the virus. You may be at higher risk for a viral illness if your immune system is weak. What are the signs or symptoms? Symptoms vary depending on the type of virus and the location of the cells that it invades. Common symptoms of the main types of viral illnesses include: Cold and flu viruses  Fever.  Headache.  Sore throat.  Muscle aches.  Nasal congestion.  Cough. Digestive system (gastrointestinal) viruses  Fever.  Abdominal pain.  Nausea.  Diarrhea. Liver viruses (hepatitis)  Loss of appetite.  Tiredness.  Yellowing of the skin (jaundice). Brain and spinal cord viruses  Fever.  Headache.  Stiff neck.  Nausea and vomiting.  Confusion or sleepiness. Skin  viruses  Warts.  Itching.  Rash. Sexually transmitted viruses  Discharge.  Swelling.  Redness.  Rash. How is this treated? Viruses can be difficult to treat because they live within cells. Antibiotic medicines do not treat viruses because these drugs do not get inside cells. Treatment for a viral illness may include:  Resting and drinking plenty of fluids.  Medicines to relieve symptoms. These can include over-the-counter medicine for pain and fever, medicines for cough or congestion, and medicines to relieve diarrhea.  Antiviral medicines. These drugs are available only for certain types of viruses. They may help reduce flu symptoms if taken early. There are also many antiviral medicines for hepatitis and HIV/AIDS.  Some viral illnesses can be prevented with vaccinations. A common example is the flu shot. Follow these instructions at home: Medicines   Take over-the-counter and prescription medicines only as told by your health care provider.  If you were prescribed an antiviral medicine, take it as told by your health care provider. Do not stop taking the medicine even if you start to feel better.  Be aware of when antibiotics are needed and when they are not needed. Antibiotics do not treat viruses. If your health care provider thinks that you may have a bacterial  infection as well as a viral infection, you may get an antibiotic. ? Do not ask for an antibiotic prescription if you have been diagnosed with a viral illness. That will not make your illness go away faster. ? Frequently taking antibiotics when they are not needed can lead to antibiotic resistance. When this develops, the medicine no longer works against the bacteria that it normally fights. General instructions  Drink enough fluids to keep your urine clear or pale yellow.  Rest as much as possible.  Return to your normal activities as told by your health care provider. Ask your health care provider what  activities are safe for you.  Keep all follow-up visits as told by your health care provider. This is important. How is this prevented? Take these actions to reduce your risk of viral infection:  Eat a healthy diet and get enough rest.  Wash your hands often with soap and water. This is especially important when you are in public places. If soap and water are not available, use hand sanitizer.  Avoid close contact with friends and family who have a viral illness.  If you travel to areas where viral gastrointestinal infection is common, avoid drinking water or eating raw food.  Keep your immunizations up to date. Get a flu shot every year as told by your health care provider.  Do not share toothbrushes, nail clippers, razors, or needles with other people.  Always practice safe sex.  Contact a health care provider if:  You have symptoms of a viral illness that do not go away.  Your symptoms come back after going away.  Your symptoms get worse. Get help right away if:  You have trouble breathing.  You have a severe headache or a stiff neck.  You have severe vomiting or abdominal pain. This information is not intended to replace advice given to you by your health care provider. Make sure you discuss any questions you have with your health care provider. Document Released: 11/17/2015 Document Revised: 12/20/2015 Document Reviewed: 11/17/2015 Elsevier Interactive Patient Education  2018 [employer]      Edwina BarthMiguel Buddy Loeffelholz, MD [facility]

## 2018-02-23 NOTE — Patient Instructions (Addendum)
   IF you received an x-ray today, you will receive an invoice from [facility]. Please contact [facility] at [phone] with questions or concerns regarding your invoice.   IF you received labwork today, you will receive an invoice from [facility]. Please contact [facility] at [phone] with questions or concerns regarding your invoice.   Our billing staff will not be able to assist you with questions regarding bills from these companies.  You will be contacted with the lab results as soon as they are available. The fastest way to get your results is to activate your My Chart account. Instructions are located on the last page of this paperwork. If you have not heard from us regarding the results in 2 weeks, please contact this office.     Viral Illness, Adult Viruses are tiny germs that can get into a person's body and cause illness. There are many different types of viruses, and they cause many types of illness. Viral illnesses can range from mild to severe. They can affect various parts of the body. Common illnesses that are caused by a virus include colds and the flu. Viral illnesses also include serious conditions such as HIV/AIDS (human immunodeficiency virus/acquired immunodeficiency syndrome). A few viruses have been linked to certain cancers. What are the causes? Many types of viruses can cause illness. Viruses invade cells in your body, multiply, and cause the infected cells to malfunction or die. When the cell dies, it releases more of the virus. When this happens, you develop symptoms of the illness, and the virus continues to spread to other cells. If the virus takes over the function of the cell, it can cause the cell to divide and grow out of control, as is the case when a virus causes cancer. Different viruses get into the body in different ways. You can get a virus by:  Swallowing food or water that is contaminated with the virus.  Breathing in droplets  that have been coughed or sneezed into the air by an infected person.  Touching a surface that has been contaminated with the virus and then touching your eyes, nose, or mouth.  Being bitten by an insect or animal that carries the virus.  Having sexual contact with a person who is infected with the virus.  Being exposed to blood or fluids that contain the virus, either through an open cut or during a transfusion.  If a virus enters your body, your body's defense system (immune system) will try to fight the virus. You may be at higher risk for a viral illness if your immune system is weak. What are the signs or symptoms? Symptoms vary depending on the type of virus and the location of the cells that it invades. Common symptoms of the main types of viral illnesses include: Cold and flu viruses  Fever.  Headache.  Sore throat.  Muscle aches.  Nasal congestion.  Cough. Digestive system (gastrointestinal) viruses  Fever.  Abdominal pain.  Nausea.  Diarrhea. Liver viruses (hepatitis)  Loss of appetite.  Tiredness.  Yellowing of the skin (jaundice). Brain and spinal cord viruses  Fever.  Headache.  Stiff neck.  Nausea and vomiting.  Confusion or sleepiness. Skin viruses  Warts.  Itching.  Rash. Sexually transmitted viruses  Discharge.  Swelling.  Redness.  Rash. How is this treated? Viruses can be difficult to treat because they live within cells. Antibiotic medicines do not treat viruses because these drugs do not get inside cells. Treatment for a viral illness   may include:  Resting and drinking plenty of fluids.  Medicines to relieve symptoms. These can include over-the-counter medicine for pain and fever, medicines for cough or congestion, and medicines to relieve diarrhea.  Antiviral medicines. These drugs are available only for certain types of viruses. They may help reduce flu symptoms if taken early. There are also many antiviral medicines  for hepatitis and HIV/AIDS.  Some viral illnesses can be prevented with vaccinations. A common example is the flu shot. Follow these instructions at home: Medicines   Take over-the-counter and prescription medicines only as told by your health care provider.  If you were prescribed an antiviral medicine, take it as told by your health care provider. Do not stop taking the medicine even if you start to feel better.  Be aware of when antibiotics are needed and when they are not needed. Antibiotics do not treat viruses. If your health care provider thinks that you may have a bacterial infection as well as a viral infection, you may get an antibiotic. ? Do not ask for an antibiotic prescription if you have been diagnosed with a viral illness. That will not make your illness go away faster. ? Frequently taking antibiotics when they are not needed can lead to antibiotic resistance. When this develops, the medicine no longer works against the bacteria that it normally fights. General instructions  Drink enough fluids to keep your urine clear or pale yellow.  Rest as much as possible.  Return to your normal activities as told by your health care provider. Ask your health care provider what activities are safe for you.  Keep all follow-up visits as told by your health care provider. This is important. How is this prevented? Take these actions to reduce your risk of viral infection:  Eat a healthy diet and get enough rest.  Wash your hands often with soap and water. This is especially important when you are in public places. If soap and water are not available, use hand sanitizer.  Avoid close contact with friends and family who have a viral illness.  If you travel to areas where viral gastrointestinal infection is common, avoid drinking water or eating raw food.  Keep your immunizations up to date. Get a flu shot every year as told by your health care provider.  Do not share toothbrushes,  nail clippers, razors, or needles with other people.  Always practice safe sex.  Contact a health care provider if:  You have symptoms of a viral illness that do not go away.  Your symptoms come back after going away.  Your symptoms get worse. Get help right away if:  You have trouble breathing.  You have a severe headache or a stiff neck.  You have severe vomiting or abdominal pain. This information is not intended to replace advice given to you by your health care provider. Make sure you discuss any questions you have with your health care provider. Document Released: 11/17/2015 Document Revised: 12/20/2015 Document Reviewed: 11/17/2015 Elsevier Interactive Patient Education  2018 [employer]  

## 2018-02-23 NOTE — Assessment & Plan Note (Signed)
No obvious source of infection.  No red flag signs or symptoms.  CBC and urinalysis unremarkable.  Most likely a viral illness.  Will monitor closely.  Advised to continue taking Advil every 6-8 hours.  Follow-up with me in 2 to 3 days.

## 2018-02-24 ENCOUNTER — Encounter: Payer: Self-pay | Admitting: Radiology

## 2018-02-24 LAB — URINE CULTURE: Organism ID, Bacteria: NO GROWTH

## 2018-02-25 ENCOUNTER — Emergency Department (HOSPITAL_COMMUNITY): Payer: BLUE CROSS/BLUE SHIELD

## 2018-02-25 ENCOUNTER — Emergency Department (HOSPITAL_COMMUNITY)
Admission: EM | Admit: 2018-02-25 | Discharge: 2018-02-26 | Disposition: A | Discharge status: Home / Self Care | Payer: BLUE CROSS/BLUE SHIELD | Attending: Emergency Medicine | Admitting: Emergency Medicine

## 2018-02-25 ENCOUNTER — Ambulatory Visit: Payer: Self-pay | Admitting: *Deleted

## 2018-02-25 ENCOUNTER — Other Ambulatory Visit: Payer: Self-pay

## 2018-02-25 ENCOUNTER — Encounter (HOSPITAL_COMMUNITY): Payer: Self-pay

## 2018-02-25 DIAGNOSIS — R0602 Shortness of breath: Secondary | ICD-10-CM | POA: Diagnosis not present

## 2018-02-25 DIAGNOSIS — Z79899 Other long term (current) drug therapy: Secondary | ICD-10-CM | POA: Insufficient documentation

## 2018-02-25 DIAGNOSIS — I10 Essential (primary) hypertension: Secondary | ICD-10-CM | POA: Insufficient documentation

## 2018-02-25 LAB — CBC WITH DIFFERENTIAL/PLATELET
BASOS PCT: 0 %
Basophils Absolute: 0 10*3/uL (ref 0.0–0.1)
EOS ABS: 0 10*3/uL (ref 0.0–0.7)
EOS PCT: 0 %
HCT: 32.5 % — ABNORMAL LOW (ref 36.0–46.0)
Hemoglobin: 11.3 g/dL — ABNORMAL LOW (ref 12.0–15.0)
Lymphocytes Relative: 40 %
Lymphs Abs: 2.4 10*3/uL (ref 0.7–4.0)
MCH: 29.8 pg (ref 26.0–34.0)
MCHC: 34.8 g/dL (ref 30.0–36.0)
MCV: 85.8 fL (ref 78.0–100.0)
MONO ABS: 0.7 10*3/uL (ref 0.1–1.0)
Monocytes Relative: 11 %
Neutro Abs: 2.9 10*3/uL (ref 1.7–7.7)
Neutrophils Relative %: 49 %
PLATELETS: 83 10*3/uL — AB (ref 150–400)
RBC: 3.79 MIL/uL — ABNORMAL LOW (ref 3.87–5.11)
RDW: 14.1 % (ref 11.5–15.5)
WBC: 6 10*3/uL (ref 4.0–10.5)

## 2018-02-25 LAB — BASIC METABOLIC PANEL
ANION GAP: 8 (ref 5–15)
BUN: 22 mg/dL — ABNORMAL HIGH (ref 6–20)
CO2: 29 mmol/L (ref 22–32)
Calcium: 9.1 mg/dL (ref 8.9–10.3)
Chloride: 100 mmol/L (ref 98–111)
Creatinine, Ser: 1.01 mg/dL — ABNORMAL HIGH (ref 0.44–1.00)
Glucose, Bld: 123 mg/dL — ABNORMAL HIGH (ref 70–99)
Potassium: 4.2 mmol/L (ref 3.5–5.1)
Sodium: 137 mmol/L (ref 135–145)

## 2018-02-25 LAB — D-DIMER, QUANTITATIVE: D-Dimer, Quant: 1.32 ug/mL-FEU — ABNORMAL HIGH (ref 0.00–0.50)

## 2018-02-25 LAB — I-STAT TROPONIN, ED: TROPONIN I, POC: 0 ng/mL (ref 0.00–0.08)

## 2018-02-25 LAB — TROPONIN I: Troponin I: <0.03 ng/mL (ref <0.03)

## 2018-02-25 MED ORDER — IOPAMIDOL (ISOVUE-370) INJECTION 76%
100.0000 mL | Freq: Once | INTRAVENOUS | Status: AC | PRN
  Administered 2018-02-26: 100 mL via INTRAVENOUS

## 2018-02-25 MED ORDER — IOPAMIDOL (ISOVUE-370) INJECTION 76%
INTRAVENOUS | Status: AC
  Filled 2018-02-25: qty 100

## 2018-02-25 NOTE — ED Provider Notes (Signed)
[facility] end of shift [facility] PA-C, with CTA pending to r/o PE.   SOB x 1 day No cough, CP, edema, fever NO history of asthma Elevated d-dimer CTA pending  Plan: review CTA for disposition.  Patient comfortable on re-evaluation. CTA negative for PE. She is comfortable with discharge home and is encouraged to see PCP for further evaluation of shortness of breath.    Elpidio AnisUpstill, Karthikeya Funke, PA-C 02/26/18 [phone]    Little, Ambrose Finlandachel Morgan, MD 03/02/18 [phone]

## 2018-02-25 NOTE — ED Notes (Signed)
Lab called and states CBC clotted-need recollect-primary RN made aware

## 2018-02-25 NOTE — Telephone Encounter (Signed)
Pt's daughter calling, pt present during call. Reports shortness of breath, onset yesterday, worsening this afternoon. States wheezing at times. SOB at rest, "Severe."  Pt's speech faltering; short phrases. Reports severe "Body aches." Unsure if febrile, "Feels hot, but not as hot as Monday."  NT can hear pt 'moaning' during call; sounds distressed.  Seen by Dr. Alvy BimlerSagardia 02/23/18 for generalized body aches, abdominal pain, fever 102.5.  Pt directed to ED. Daughter states will follow disposition. NT offered to call 911; daughter states she can transport.  Reason for Disposition . [1] MODERATE difficulty breathing (e.g., speaks in phrases, SOB even at rest, pulse 100-120) AND [2] NEW-onset or WORSE than normal  Answer Assessment - Initial Assessment Questions 1. RESPIRATORY STATUS: "Describe your breathing?" (e.g., wheezing, shortness of breath, unable to speak, severe coughing)     Severe SOB 2. ONSET: "When did this breathing problem begin?"      Yesterday 3. PATTERN "Does the difficult breathing come and go, or has it been constant since it started?"     COnstant, episode yesterday 4. SEVERITY: "How bad is your breathing?" (e.g., mild, moderate, severe)    - MILD: No SOB at rest, mild SOB with walking, speaks normally in sentences, can lay down, no retractions, pulse < 100.    - MODERATE: SOB at rest, SOB with minimal exertion and prefers to sit, cannot lie down flat, speaks in phrases, mild retractions, audible wheezing, pulse 100-120.    - SEVERE: Very SOB at rest, speaks in single words, struggling to breathe, sitting hunched forward, retractions, pulse > 120      Severe 5. RECURRENT SYMPTOM: "Have you had difficulty breathing before?" If so, ask: "When was the last time?" and "What happened that time?"       6. CARDIAC HISTORY: "Do you have any history of heart disease?" (e.g., heart attack, angina, bypass surgery, angioplasty)       7. LUNG HISTORY: "Do you have any history of lung disease?"   (e.g., pulmonary embolus, asthma, emphysema)      8. CAUSE: "What do you think is causing the breathing problem?"       9. OTHER SYMPTOMS: "Do you have any other symptoms? (e.g., dizziness, runny nose, cough, chest pain, fever)  Protocols used: BREATHING DIFFICULTY-A-AH

## 2018-02-25 NOTE — ED Triage Notes (Signed)
Patient c/o SOB since yesterday. Patient denies any CP or cough.

## 2018-02-25 NOTE — ED Provider Notes (Signed)
[facility]-EMERGENCY DEPT Provider Note  CSN: [id number] Arrival date & time: 02/25/18  1454   History   Chief Complaint Chief Complaint  Patient presents with  . Shortness of Breath    HPI  History is limited due to language barrier. Patient's daughter assists in providing history.   Shannon Williamson is a 57 y.o. female with a medical history of HTN and allergies who presented to the ED for shortness of breath x1 day. She states that this began last night. Denies fever, chest pain, cough/hemoptysis, palpitations, leg swelling, abdominal pain, N/V, upper respiratory symptoms, changes in bowel or urinary habits. Patient has tried nothing prior to coming to the ED. Denies recent sick contacts, recent travel or allergies.  Additional history obtained by medical chart. She was seen by internal medicine on 02/23/18 for abdominal pain, fever and body aches, but had no SOB at that time.   Past Medical History:  Diagnosis Date  . Allergy   . Hypertension     Patient Active Problem List   Diagnosis Date Noted  . Abdominal pain 02/23/2018  . Fever 02/23/2018  . Generalized body aches 02/23/2018  . Viral illness 02/23/2018  . Acute bronchitis 02/26/2017    Past Surgical History:  Procedure Laterality Date  . CARPAL TUNNEL RELEASE Right 06/21/2013   Procedure: RIGHT CARPAL TUNNEL RELEASE;  Surgeon: Nicki Reaper, MD;  Location: [facility];  Service: Orthopedics;  Laterality: Right;  . NO PAST SURGERIES       OB History   None      Home Medications    Prior to Admission medications   Medication Sig Start Date End Date Taking? Authorizing Provider  diclofenac (VOLTAREN) 50 MG EC tablet TAKE 1 TABLET BY MOUTH THREE TIMES A DAY 10/21/17  Yes Kerrin Champagne, MD  gabapentin (NEURONTIN) 100 MG capsule Take 1 capsule (100 mg total) by mouth 3 (three) times daily. Patient taking differently: Take 300 mg by mouth 3 (three) times daily.  07/04/17  Yes  Kerrin Champagne, MD  hydrochlorothiazide (MICROZIDE) 12.5 MG capsule Take 12.5 mg by mouth daily.   Yes [provider]  lisinopril (PRINIVIL,ZESTRIL) 20 MG tablet Take 20 mg by mouth 2 (two) times daily.   Yes [provider]  metoprolol succinate (TOPROL-XL) 100 MG 24 hr tablet Take 100 mg by mouth daily. Take with or immediately following a meal.   Yes [provider]  tiZANidine (ZANAFLEX) 4 MG tablet Take 4 mg by mouth 2 (two) times daily as needed for muscle spasms.   Yes [provider]  traMADol (ULTRAM) 50 MG tablet Take 1 tablet (50 mg total) by mouth every 6 (six) hours as needed. 11/21/17  Yes Kerrin Champagne, MD  cyclobenzaprine (FLEXERIL) 10 MG tablet Take 1 tablet (10 mg total) by mouth 2 (two) times daily as needed for muscle spasms. Patient not taking: Reported on 02/25/2018 10/20/17   Roxy Horseman, PA-C  predniSONE (DELTASONE) 20 MG tablet Take 2 tablets (40 mg total) by mouth daily. Take 40 mg by mouth daily for 3 days, then 20mg  by mouth daily for 3 days, then 10mg  daily for 3 days Patient not taking: Reported on 02/25/2018 10/20/17   Roxy Horseman, PA-C    Family History History reviewed. No pertinent family history.  Social History Social History   Tobacco Use  . Smoking status: Never Smoker  . Smokeless tobacco: Never Used  Substance Use Topics  . Alcohol use: No  .  Drug use: No     Allergies   Patient has no known allergies.   Review of Systems Review of Systems  Constitutional: Negative for chills and fever.  HENT: Negative.   Eyes: Negative.   Respiratory: Positive for shortness of breath. Negative for cough and chest tightness.   Cardiovascular: Negative for chest pain, palpitations and leg swelling.  Gastrointestinal: Negative for abdominal distention, abdominal pain, constipation, diarrhea, nausea and vomiting.  Genitourinary: Negative.   Musculoskeletal: Negative.   Skin: Negative.   Neurological: Negative.     Hematological: Negative.   Psychiatric/Behavioral: Negative.      Physical Exam Updated Vital Signs BP 118/65 (BP Location: Left Arm)   Pulse 69   Temp 98.6 F (37 C) (Oral)   Resp (!) 23   Ht 5' 3.5" (1.613 m)   Wt 76.2 kg (168 lb)   SpO2 97%   BMI 29.29 kg/m   Physical Exam  Constitutional: She appears well-developed and well-nourished. She is cooperative.  HENT:  Head: Normocephalic and atraumatic.  Mouth/Throat: Uvula is midline, oropharynx is clear and moist and mucous membranes are normal.  Eyes: Pupils are equal, round, and reactive to light. Conjunctivae, EOM and lids are normal.  Neck: Normal range of motion. Neck supple.  Cardiovascular: Normal rate, regular rhythm, normal heart sounds and intact distal pulses.  Pulmonary/Chest: Effort normal and breath sounds normal. No accessory muscle usage. No respiratory distress. She has no decreased breath sounds. She has no wheezes. She has no rales.  Not tachypneic on initial exam  Abdominal: Soft. Bowel sounds are normal. There is no tenderness.  Neurological: She is alert.  Skin: Skin is warm and intact. Capillary refill takes less than 2 seconds. No cyanosis.  Nursing note and vitals reviewed.    ED Treatments / Results  Labs (all labs ordered are listed, but only abnormal results are displayed) Labs Reviewed  BASIC METABOLIC PANEL - Abnormal; Notable for the following components:      Result Value   Glucose, Bld 123 (*)    BUN 22 (*)    Creatinine, Ser 1.01 (*)    All other components within normal limits  CBC WITH DIFFERENTIAL/PLATELET - Abnormal; Notable for the following components:   RBC 3.79 (*)    Hemoglobin 11.3 (*)    HCT 32.5 (*)    Platelets 83 (*)    All other components within normal limits  D-DIMER, QUANTITATIVE (NOT AT [facility]) - Abnormal; Notable for the following components:   D-Dimer, Quant 1.32 (*)    All other components within normal limits  TROPONIN I  CBC WITH DIFFERENTIAL/PLATELET   I-STAT TROPONIN, ED    EKG EKG Interpretation  Date/Time:  Wednesday February 25 2018 15:34:13 EDT Ventricular Rate:  76 PR Interval:    QRS Duration: 92 QT Interval:  359 QTC Calculation: 404 R Axis:   39 Text Interpretation:  Sinus rhythm Borderline T abnormalities, diffuse leads Baseline wander in lead(s) V3 previous T wave flattening in V4-V6 is now subtle T wave inversions; inferior T wave inversions are similar to previous Confirmed by Frederick Peers [phone]) on 02/25/2018 10:31:58 PM   Radiology Dg Chest 2 View  Result Date: 02/25/2018 CLINICAL DATA:  Shortness of breath since yesterday, worsening this afternoon. Some wheezing. Myalgias, abdominal pain and fever. EXAM: CHEST - 2 VIEW COMPARISON:  02/26/2017. FINDINGS: The cardiac silhouette remains near the upper limit of normal in size. Clear lungs with normal vascularity. Thoracic spine degenerative changes. IMPRESSION: No acute abnormality. Electronically  Signed   By: Beckie SaltsSteven  Reid M.D.   On: 02/25/2018 17:38    Procedures Procedures (including critical care time)  Medications Ordered in ED Medications  iopamidol (ISOVUE-370) 76 % injection (has no administration in time range)  iopamidol (ISOVUE-370) 76 % injection 100 mL (has no administration in time range)     Initial Impression / Assessment and Plan / ED Course  Triage vital signs and the nursing notes have been reviewed.  Pertinent labs & imaging results that were available during care of the patient were reviewed and considered in medical decision making (see chart for details).  Patient presents for SOB and no other physical complaints. History obtained is limited due to language barrier. She was not in respiratory distress on initial assessment and she was not on supplemental oxygen. Patient has no risk factors for PE, but given lack of history work-up for this will be pursued.  Clinical Course as of Feb 25 2253  Wed Feb 25, 2018  1918 EKG showed NSR. No ST  elevations/depressions or signs of acute ischemia or infarct. T wave inversions seen are similar to prior EKG in 2014. This is reassuring in combination with negative troponin. CXR normal.   [GM]  2222 Elevated d-dimer at 1.32. Will order CTA to evaluate for PE.   [GM]  2243 Case discussed with Elpidio AnisShari Upstill, PA-C at shift change. If PE is found and patient remains stable, she can likely be discharged with home anticoagulant. Will defer decision to PA Upstill.   [GM]    Clinical Course User Index [GM] Mortis, Sharyon MedicusGabrielle I, PA-C    Final Clinical Impressions(s) / ED Diagnoses  1. Shortness of Breath. Elevated d-dimer. Concern for PE. Pending CTA. Case discussed with Elpidio AnisShari Upstill, PA-C at shift change.  Final diagnoses:  None    ED Discharge Orders    None        Windy CarinaMortis, Gabrielle I, [place] JerseyPA-C 02/25/18 2254    Little, Ambrose Finlandachel Morgan, MD 02/26/18 [phone]

## 2018-02-26 ENCOUNTER — Other Ambulatory Visit: Payer: Self-pay

## 2018-02-26 ENCOUNTER — Encounter: Payer: Self-pay | Admitting: Emergency Medicine

## 2018-02-26 ENCOUNTER — Ambulatory Visit (INDEPENDENT_AMBULATORY_CARE_PROVIDER_SITE_OTHER): Payer: BLUE CROSS/BLUE SHIELD | Admitting: Emergency Medicine

## 2018-02-26 VITALS — BP 104/60 | HR 77 | Temp 98.4°F | Resp 16 | Wt 167.4 lb

## 2018-02-26 DIAGNOSIS — R0602 Shortness of breath: Secondary | ICD-10-CM | POA: Diagnosis not present

## 2018-02-26 DIAGNOSIS — R509 Fever, unspecified: Secondary | ICD-10-CM

## 2018-02-26 DIAGNOSIS — B349 Viral infection, unspecified: Secondary | ICD-10-CM

## 2018-02-26 NOTE — ED Notes (Signed)
Downtime see nurses notes 

## 2018-02-26 NOTE — Discharge Instructions (Addendum)
Your tests here are reassuring. Your CT is negative for blood clots. You can be discharged home and should see your doctor for further evaluation of causes of shortness or breath.

## 2018-02-26 NOTE — Progress Notes (Signed)
Shannon Williamson 57 y.o.   Chief Complaint  Patient presents with  . viral illness    follow up from 02/23/2018    HISTORY OF PRESENT ILLNESS: This is a 57 y.o. female seen by me on 02/23/2018 with fever and generalized achiness.  Suspected viral illness.  Here for follow-up. Feels a lot better.  However had to go to the emergency room yesterday with mild difficulty breathing.  There was a concern about PE but it was ruled out with a chest CTA.  Feels 100% better.  Fever is gone.  No new symptoms or concerns.  Much improved. Recent Results (from the past 2160 hour(s))  POCT urinalysis dipstick     Status: Abnormal   Collection Time: 02/23/18 10:51 AM  Result Value Ref Range   Color, UA yellow yellow   Clarity, UA clear clear   Glucose, UA negative negative mg/dL   Bilirubin, UA small (A) negative   Ketones, POC UA trace (5) (A) negative mg/dL   Spec Grav, UA >=1.030 (A) 1.010 - 1.025   Blood, UA small (A) negative   pH, UA 5.0 5.0 - 8.0   Protein Ur, POC =100 (A) negative mg/dL   Urobilinogen, UA 0.2 0.2 or 1.0 E.U./dL   Nitrite, UA Negative Negative   Leukocytes, UA Negative Negative  POCT CBC     Status: Abnormal   Collection Time: 02/23/18 11:30 AM  Result Value Ref Range   WBC 8.2 4.6 - 10.2 K/uL   Lymph, poc 1.3 0.6 - 3.4   POC LYMPH PERCENT 16.0 10 - 50 %L   MID (cbc) 0.5 0 - 0.9   POC MID % 5.5 0 - 12 %M   POC Granulocyte 6.4 2 - 6.9   Granulocyte percent 78.5 37 - 80 %G   RBC 4.04 4.04 - 5.48 M/uL   Hemoglobin 11.5 (A) 12.2 - 16.2 g/dL   HCT, POC 36.7 (A) 37.7 - 47.9 %   MCV 90.8 80 - 97 fL   MCH, POC 28.4 27 - 31.2 pg   MCHC 31.3 (A) 31.8 - 35.4 g/dL   RDW, POC 14.2 %   Platelet Count, POC 109 (A) 142 - 424 K/uL   MPV 8.3 0 - 99.8 fL  POCT glucose (manual entry)     Status: Abnormal   Collection Time: 02/23/18 11:32 AM  Result Value Ref Range   POC Glucose 142 (A) 70 - 99 mg/dl  Comprehensive metabolic panel     Status: Abnormal   Collection Time: 02/23/18  12:31 PM  Result Value Ref Range   Glucose 139 (H) 65 - 99 mg/dL   BUN 15 6 - 24 mg/dL   Creatinine, Ser 1.05 (H) 0.57 - 1.00 mg/dL   GFR calc non Af Amer 60 >59 mL/min/1.73   GFR calc Af Amer 69 >59 mL/min/1.73   BUN/Creatinine Ratio 14 9 - 23   Sodium 134 134 - 144 mmol/L   Potassium 3.9 3.5 - 5.2 mmol/L   Chloride 97 96 - 106 mmol/L   CO2 22 20 - 29 mmol/L   Calcium 9.3 8.7 - 10.2 mg/dL   Total Protein 7.0 6.0 - 8.5 g/dL   Albumin 3.9 3.5 - 5.5 g/dL   Globulin, Total 3.1 1.5 - 4.5 g/dL   Albumin/Globulin Ratio 1.3 1.2 - 2.2   Bilirubin Total 1.9 (H) 0.0 - 1.2 mg/dL   Alkaline Phosphatase 84 39 - 117 IU/L   AST 26 0 - 40 IU/L   ALT 23  0 - 32 IU/L  Urine Culture     Status: None   Collection Time: 02/23/18 12:40 PM  Result Value Ref Range   Urine Culture, Routine Final report    Organism ID, Bacteria No growth   Basic metabolic panel     Status: Abnormal   Collection Time: 02/25/18  8:19 PM  Result Value Ref Range   Sodium 137 135 - 145 mmol/L   Potassium 4.2 3.5 - 5.1 mmol/L   Chloride 100 98 - 111 mmol/L   CO2 29 22 - 32 mmol/L   Glucose, Bld 123 (H) 70 - 99 mg/dL   BUN 22 (H) 6 - 20 mg/dL   Creatinine, Ser 1.01 (H) 0.44 - 1.00 mg/dL   Calcium 9.1 8.9 - 10.3 mg/dL   GFR calc non Af Amer >60 >60 mL/min   GFR calc Af Amer >60 >60 mL/min    Comment: (NOTE) The eGFR has been calculated using the CKD EPI equation. This calculation has not been validated in all clinical situations. eGFR's persistently <60 mL/min signify possible Chronic Kidney Disease.    Anion gap 8 5 - 15    Comment: Performed at [facility], [place] [address], [place], [place] [address]  Troponin I     Status: None   Collection Time: 02/25/18  8:19 PM  Result Value Ref Range   Troponin I <0.03 <0.03 ng/mL    Comment: Performed at [facility], [place] [address], [place], [place] [address]  I-stat troponin, ED     Status: None   Collection Time: 02/25/18  8:23 PM    Result Value Ref Range   Troponin i, poc 0.00 0.00 - 0.08 ng/mL   Comment 3            Comment: Due to the release kinetics of cTnI, a negative result within the first hours of the onset of symptoms does not rule out myocardial infarction with certainty. If myocardial infarction is still suspected, repeat the test at appropriate intervals.   CBC with Differential/Platelet     Status: Abnormal   Collection Time: 02/25/18  9:12 PM  Result Value Ref Range   WBC 6.0 4.0 - 10.5 K/uL   RBC 3.79 (L) 3.87 - 5.11 MIL/uL   Hemoglobin 11.3 (L) 12.0 - 15.0 g/dL   HCT 32.5 (L) 36.0 - 46.0 %   MCV 85.8 78.0 - 100.0 fL   MCH 29.8 26.0 - 34.0 pg   MCHC 34.8 30.0 - 36.0 g/dL   RDW 14.1 11.5 - 15.5 %   Platelets 83 (L) 150 - 400 K/uL    Comment: REPEATED TO VERIFY SPECIMEN CHECKED FOR CLOTS PLATELET COUNT CONFIRMED BY SMEAR    Neutrophils Relative % 49 %   Neutro Abs 2.9 1.7 - 7.7 K/uL   Lymphocytes Relative 40 %   Lymphs Abs 2.4 0.7 - 4.0 K/uL   Monocytes Relative 11 %   Monocytes Absolute 0.7 0.1 - 1.0 K/uL   Eosinophils Relative 0 %   Eosinophils Absolute 0.0 0.0 - 0.7 K/uL   Basophils Relative 0 %   Basophils Absolute 0.0 0.0 - 0.1 K/uL    Comment: Performed at [facility], [place] [address], [place], [place] [address]  D-dimer, quantitative (not at [facility])     Status: Abnormal   Collection Time: 02/25/18  9:24 PM  Result Value Ref Range   D-Dimer, Quant 1.32 (H) 0.00 - 0.50 ug/mL-FEU    Comment: (NOTE) At the  manufacturer cut-off of 0.50 ug/mL FEU, this assay has been documented to exclude PE with a sensitivity and negative predictive value of 97 to 99%.  At this time, this assay has not been approved by the FDA to exclude DVT/VTE. Results should be correlated with clinical presentation. Performed at [facility], [place] [address], [place], [place] [address]      HPI   Prior to Admission medications   Medication Sig Start Date End Date  Taking? Authorizing Provider  diclofenac (VOLTAREN) 50 MG EC tablet TAKE 1 TABLET BY MOUTH THREE TIMES A DAY 10/21/17  Yes Jessy Oto, MD  gabapentin (NEURONTIN) 100 MG capsule Take 1 capsule (100 mg total) by mouth 3 (three) times daily. Patient taking differently: Take 300 mg by mouth 3 (three) times daily.  07/04/17  Yes Jessy Oto, MD  hydrochlorothiazide (MICROZIDE) 12.5 MG capsule Take 12.5 mg by mouth daily.   Yes [provider]  lisinopril (PRINIVIL,ZESTRIL) 20 MG tablet Take 20 mg by mouth 2 (two) times daily.   Yes [provider]  metoprolol succinate (TOPROL-XL) 100 MG 24 hr tablet Take 100 mg by mouth daily. Take with or immediately following a meal.   Yes [provider]  tiZANidine (ZANAFLEX) 4 MG tablet Take 4 mg by mouth 2 (two) times daily as needed for muscle spasms.   Yes [provider]  cyclobenzaprine (FLEXERIL) 10 MG tablet Take 1 tablet (10 mg total) by mouth 2 (two) times daily as needed for muscle spasms. Patient not taking: Reported on 02/25/2018 10/20/17   Montine Circle, PA-C  predniSONE (DELTASONE) 20 MG tablet Take 2 tablets (40 mg total) by mouth daily. Take 40 mg by mouth daily for 3 days, then 27m by mouth daily for 3 days, then 159mdaily for 3 days Patient not taking: Reported on 02/25/2018 10/20/17   BrMontine CirclePA-C  traMADol (ULTRAM) 50 MG tablet Take 1 tablet (50 mg total) by mouth every 6 (six) hours as needed. 11/21/17   NiJessy OtoMD    No Known Allergies  Patient Active Problem List   Diagnosis Date Noted  . Abdominal pain 02/23/2018  . Fever 02/23/2018  . Generalized body aches 02/23/2018  . Viral illness 02/23/2018  . Acute bronchitis 02/26/2017    Past Medical History:  Diagnosis Date  . Allergy   . Hypertension     Past Surgical History:  Procedure Laterality Date  . CARPAL TUNNEL RELEASE Right 06/21/2013   Procedure: RIGHT CARPAL TUNNEL RELEASE;  Surgeon: GaWynonia SoursMD;  [place]  [facility]: [place];  Laterality: Right;  . NO PAST SURGERIES      Social History   Socioeconomic History  . Marital status: Married    Spouse name: Not on file  . Number of children: Not on file  . Years of education: Not on file  . Highest education level: Not on file  Occupational History  . Not on file  Social Needs  . Financial resource strain: Not on file  . Food insecurity:    Worry: Not on file    Inability: Not on file  . Transportation needs:    Medical: Not on file    Non-medical: Not on file  Tobacco Use  . Smoking status: Never Smoker  . Smokeless tobacco: Never Used  Substance and Sexual Activity  . Alcohol use: No  . Drug use: No  . Sexual activity: Yes  Lifestyle  . Physical  activity:    Days per week: Not on file    Minutes per session: Not on file  . Stress: Not on file  Relationships  . Social connections:    Talks on phone: Not on file    Gets together: Not on file    Attends religious service: Not on file    Active member of club or organization: Not on file    Attends meetings of clubs or organizations: Not on file    Relationship status: Not on file  . Intimate partner violence:    Fear of current or ex partner: Not on file    Emotionally abused: Not on file    Physically abused: Not on file    Forced sexual activity: Not on file  Other Topics Concern  . Not on file  Social History Narrative  . Not on file    No family history on file.   Review of Systems  Constitutional: Negative.  Negative for chills and fever.  HENT: Negative.  Negative for sore throat.   Eyes: Negative.  Negative for blurred vision and double vision.  Respiratory: Negative.  Negative for cough and shortness of breath.   Cardiovascular: Negative.  Negative for chest pain and palpitations.  Gastrointestinal: Negative.  Negative for abdominal pain, diarrhea, nausea and vomiting.  Genitourinary: Negative.  Negative for dysuria and  hematuria.  Musculoskeletal: Negative.  Negative for back pain, myalgias and neck pain.  Neurological: Negative.  Negative for dizziness and headaches.  Endo/Heme/Allergies: Negative.   All other systems reviewed and are negative.   Vitals:   02/26/18 0825  BP: 104/60  Pulse: 77  Resp: 16  Temp: 98.4 F (36.9 C)  SpO2: 100%    Physical Exam  Constitutional: She is oriented to person, place, and time. She appears well-developed and well-nourished.  HENT:  Head: Normocephalic and atraumatic.  Nose: Nose normal.  Mouth/Throat: Oropharynx is clear and moist.  Eyes: Pupils are equal, round, and reactive to light. Conjunctivae and EOM are normal.  Neck: Normal range of motion. Neck supple.  Cardiovascular: Normal rate and regular rhythm.  Pulmonary/Chest: Effort normal and breath sounds normal.  Abdominal: Soft. She exhibits no distension. There is no tenderness.  Musculoskeletal: Normal range of motion. She exhibits no edema or tenderness.  Neurological: She is alert and oriented to person, place, and time. No sensory deficit. She exhibits normal muscle tone.  Skin: Skin is warm and dry. Capillary refill takes less than 2 seconds.  Psychiatric: She has a normal mood and affect. Her behavior is normal.  Vitals reviewed.  A total of 25 minutes was spent in the room with the patient, greater than 50% of which was in counseling/coordination of care regarding treatment, medications, prognosis, nutrition and need for follow-up if no better or worse.  Work note provided today.  May return to work next Monday..   ASSESSMENT & PLAN: Kavina was seen today for viral illness.  Diagnoses and all orders for this visit:  Viral illness Comments: much improved  Fever, unspecified fever cause Comments: gone    Patient Instructions       IF you received an x-ray today, you will receive an invoice from [facility]. Please contact [facility] at [phone] with  questions or concerns regarding your invoice.   IF you received labwork today, you will receive an invoice from [facility]. Please contact [facility] at [phone] with questions or concerns regarding your invoice.   Our billing staff will not be able to  assist you with questions regarding bills from these companies.  You will be contacted with the lab results as soon as they are available. The fastest way to get your results is to activate your My Chart account. Instructions are located on the last page of this paperwork. If you have not heard from [place] regarding the results in 2 weeks, please contact this office.     Viral Illness, Adult Viruses are tiny germs that can get into a person's body and cause illness. There are many different types of viruses, and they cause many types of illness. Viral illnesses can range from mild to severe. They can affect various parts of the body. Common illnesses that are caused by a virus include colds and the flu. Viral illnesses also include serious conditions such as HIV/AIDS (human immunodeficiency virus/acquired immunodeficiency syndrome). A few viruses have been linked to certain cancers. What are the causes? Many types of viruses can cause illness. Viruses invade cells in your body, multiply, and cause the infected cells to malfunction or die. When the cell dies, it releases more of the virus. When this happens, you develop symptoms of the illness, and the virus continues to spread to other cells. If the virus takes over the function of the cell, it can cause the cell to divide and grow out of control, as is the case when a virus causes cancer. Different viruses get into the body in different ways. You can get a virus by:  Swallowing food or water that is contaminated with the virus.  Breathing in droplets that have been coughed or sneezed into the air by an infected person.  Touching a surface that has been contaminated with the virus and then touching  your eyes, nose, or mouth.  Being bitten by an insect or animal that carries the virus.  Having sexual contact with a person who is infected with the virus.  Being exposed to blood or fluids that contain the virus, either through an open cut or during a transfusion.  If a virus enters your body, your body's defense system (immune system) will try to fight the virus. You may be at higher risk for a viral illness if your immune system is weak. What are the signs or symptoms? Symptoms vary depending on the type of virus and the location of the cells that it invades. Common symptoms of the main types of viral illnesses include: Cold and flu viruses  Fever.  Headache.  Sore throat.  Muscle aches.  Nasal congestion.  Cough. Digestive system (gastrointestinal) viruses  Fever.  Abdominal pain.  Nausea.  Diarrhea. Liver viruses (hepatitis)  Loss of appetite.  Tiredness.  Yellowing of the skin (jaundice). Brain and spinal cord viruses  Fever.  Headache.  Stiff neck.  Nausea and vomiting.  Confusion or sleepiness. Skin viruses  Warts.  Itching.  Rash. Sexually transmitted viruses  Discharge.  Swelling.  Redness.  Rash. How is this treated? Viruses can be difficult to treat because they live within cells. Antibiotic medicines do not treat viruses because these drugs do not get inside cells. Treatment for a viral illness may include:  Resting and drinking plenty of fluids.  Medicines to relieve symptoms. These can include over-the-counter medicine for pain and fever, medicines for cough or congestion, and medicines to relieve diarrhea.  Antiviral medicines. These drugs are available only for certain types of viruses. They may help reduce flu symptoms if taken early. There are also many antiviral medicines for hepatitis and HIV/AIDS.  Some viral illnesses can be prevented with vaccinations. A common example is the flu shot. Follow these instructions at  home: Medicines   Take over-the-counter and prescription medicines only as told by your health care provider.  If you were prescribed an antiviral medicine, take it as told by your health care provider. Do not stop taking the medicine even if you start to feel better.  Be aware of when antibiotics are needed and when they are not needed. Antibiotics do not treat viruses. If your health care provider thinks that you may have a bacterial infection as well as a viral infection, you may get an antibiotic. ? Do not ask for an antibiotic prescription if you have been diagnosed with a viral illness. That will not make your illness go away faster. ? Frequently taking antibiotics when they are not needed can lead to antibiotic resistance. When this develops, the medicine no longer works against the bacteria that it normally fights. General instructions  Drink enough fluids to keep your urine clear or pale yellow.  Rest as much as possible.  Return to your normal activities as told by your health care provider. Ask your health care provider what activities are safe for you.  Keep all follow-up visits as told by your health care provider. This is important. How is this prevented? Take these actions to reduce your risk of viral infection:  Eat a healthy diet and get enough rest.  Wash your hands often with soap and water. This is especially important when you are in public places. If soap and water are not available, use hand sanitizer.  Avoid close contact with friends and family who have a viral illness.  If you travel to areas where viral gastrointestinal infection is common, avoid drinking water or eating raw food.  Keep your immunizations up to date. Get a flu shot every year as told by your health care provider.  Do not share toothbrushes, nail clippers, razors, or needles with other people.  Always practice safe sex.  Contact a health care provider if:  You have symptoms of a viral  illness that do not go away.  Your symptoms come back after going away.  Your symptoms get worse. Get help right away if:  You have trouble breathing.  You have a severe headache or a stiff neck.  You have severe vomiting or abdominal pain. This information is not intended to replace advice given to you by your health care provider. Make sure you discuss any questions you have with your health care provider. Document Released: 11/17/2015 Document Revised: 12/20/2015 Document Reviewed: 11/17/2015 Elsevier Interactive Patient Education  2018 [employer]      Agustina Caroli, MD [facility]

## 2018-02-26 NOTE — Patient Instructions (Addendum)
   IF you received an x-ray today, you will receive an invoice from [facility]. Please contact  Radiology at [phone] with questions or concerns regarding your invoice.   IF you received labwork today, you will receive an invoice from [facility]. Please contact [facility] at [phone] with questions or concerns regarding your invoice.   Our billing staff will not be able to assist you with questions regarding bills from these companies.  You will be contacted with the lab results as soon as they are available. The fastest way to get your results is to activate your My Chart account. Instructions are located on the last page of this paperwork. If you have not heard from us regarding the results in 2 weeks, please contact this office.     Viral Illness, Adult Viruses are tiny germs that can get into a person's body and cause illness. There are many different types of viruses, and they cause many types of illness. Viral illnesses can range from mild to severe. They can affect various parts of the body. Common illnesses that are caused by a virus include colds and the flu. Viral illnesses also include serious conditions such as HIV/AIDS (human immunodeficiency virus/acquired immunodeficiency syndrome). A few viruses have been linked to certain cancers. What are the causes? Many types of viruses can cause illness. Viruses invade cells in your body, multiply, and cause the infected cells to malfunction or die. When the cell dies, it releases more of the virus. When this happens, you develop symptoms of the illness, and the virus continues to spread to other cells. If the virus takes over the function of the cell, it can cause the cell to divide and grow out of control, as is the case when a virus causes cancer. Different viruses get into the body in different ways. You can get a virus by:  Swallowing food or water that is contaminated with the virus.  Breathing in droplets  that have been coughed or sneezed into the air by an infected person.  Touching a surface that has been contaminated with the virus and then touching your eyes, nose, or mouth.  Being bitten by an insect or animal that carries the virus.  Having sexual contact with a person who is infected with the virus.  Being exposed to blood or fluids that contain the virus, either through an open cut or during a transfusion.  If a virus enters your body, your body's defense system (immune system) will try to fight the virus. You may be at higher risk for a viral illness if your immune system is weak. What are the signs or symptoms? Symptoms vary depending on the type of virus and the location of the cells that it invades. Common symptoms of the main types of viral illnesses include: Cold and flu viruses  Fever.  Headache.  Sore throat.  Muscle aches.  Nasal congestion.  Cough. Digestive system (gastrointestinal) viruses  Fever.  Abdominal pain.  Nausea.  Diarrhea. Liver viruses (hepatitis)  Loss of appetite.  Tiredness.  Yellowing of the skin (jaundice). Brain and spinal cord viruses  Fever.  Headache.  Stiff neck.  Nausea and vomiting.  Confusion or sleepiness. Skin viruses  Warts.  Itching.  Rash. Sexually transmitted viruses  Discharge.  Swelling.  Redness.  Rash. How is this treated? Viruses can be difficult to treat because they live within cells. Antibiotic medicines do not treat viruses because these drugs do not get inside cells. Treatment for a viral illness   may include:  Resting and drinking plenty of fluids.  Medicines to relieve symptoms. These can include over-the-counter medicine for pain and fever, medicines for cough or congestion, and medicines to relieve diarrhea.  Antiviral medicines. These drugs are available only for certain types of viruses. They may help reduce flu symptoms if taken early. There are also many antiviral medicines  for hepatitis and HIV/AIDS.  Some viral illnesses can be prevented with vaccinations. A common example is the flu shot. Follow these instructions at home: Medicines   Take over-the-counter and prescription medicines only as told by your health care provider.  If you were prescribed an antiviral medicine, take it as told by your health care provider. Do not stop taking the medicine even if you start to feel better.  Be aware of when antibiotics are needed and when they are not needed. Antibiotics do not treat viruses. If your health care provider thinks that you may have a bacterial infection as well as a viral infection, you may get an antibiotic. ? Do not ask for an antibiotic prescription if you have been diagnosed with a viral illness. That will not make your illness go away faster. ? Frequently taking antibiotics when they are not needed can lead to antibiotic resistance. When this develops, the medicine no longer works against the bacteria that it normally fights. General instructions  Drink enough fluids to keep your urine clear or pale yellow.  Rest as much as possible.  Return to your normal activities as told by your health care provider. Ask your health care provider what activities are safe for you.  Keep all follow-up visits as told by your health care provider. This is important. How is this prevented? Take these actions to reduce your risk of viral infection:  Eat a healthy diet and get enough rest.  Wash your hands often with soap and water. This is especially important when you are in public places. If soap and water are not available, use hand sanitizer.  Avoid close contact with friends and family who have a viral illness.  If you travel to areas where viral gastrointestinal infection is common, avoid drinking water or eating raw food.  Keep your immunizations up to date. Get a flu shot every year as told by your health care provider.  Do not share toothbrushes,  nail clippers, razors, or needles with other people.  Always practice safe sex.  Contact a health care provider if:  You have symptoms of a viral illness that do not go away.  Your symptoms come back after going away.  Your symptoms get worse. Get help right away if:  You have trouble breathing.  You have a severe headache or a stiff neck.  You have severe vomiting or abdominal pain. This information is not intended to replace advice given to you by your health care provider. Make sure you discuss any questions you have with your health care provider. Document Released: 11/17/2015 Document Revised: 12/20/2015 Document Reviewed: 11/17/2015 Elsevier Interactive Patient Education  2018 [employer]  

## 2018-03-05 ENCOUNTER — Ambulatory Visit (INDEPENDENT_AMBULATORY_CARE_PROVIDER_SITE_OTHER): Payer: BLUE CROSS/BLUE SHIELD | Admitting: Specialist

## 2018-04-21 DIAGNOSIS — I10 Essential (primary) hypertension: Secondary | ICD-10-CM | POA: Diagnosis not present

## 2018-04-21 DIAGNOSIS — K219 Gastro-esophageal reflux disease without esophagitis: Secondary | ICD-10-CM | POA: Diagnosis not present

## 2018-04-21 DIAGNOSIS — M25519 Pain in unspecified shoulder: Secondary | ICD-10-CM | POA: Diagnosis not present

## 2018-04-21 DIAGNOSIS — M48062 Spinal stenosis, lumbar region with neurogenic claudication: Secondary | ICD-10-CM | POA: Diagnosis not present

## 2018-06-04 DIAGNOSIS — M48062 Spinal stenosis, lumbar region with neurogenic claudication: Secondary | ICD-10-CM | POA: Diagnosis not present

## 2018-06-04 DIAGNOSIS — I10 Essential (primary) hypertension: Secondary | ICD-10-CM | POA: Diagnosis not present

## 2018-06-04 DIAGNOSIS — M79643 Pain in unspecified hand: Secondary | ICD-10-CM | POA: Diagnosis not present

## 2018-06-04 DIAGNOSIS — J302 Other seasonal allergic rhinitis: Secondary | ICD-10-CM | POA: Diagnosis not present

## 2018-07-03 ENCOUNTER — Ambulatory Visit (INDEPENDENT_AMBULATORY_CARE_PROVIDER_SITE_OTHER): Payer: BLUE CROSS/BLUE SHIELD | Admitting: Specialist

## 2018-09-03 DIAGNOSIS — Z0000 Encounter for general adult medical examination without abnormal findings: Secondary | ICD-10-CM | POA: Diagnosis not present

## 2018-09-03 DIAGNOSIS — Z131 Encounter for screening for diabetes mellitus: Secondary | ICD-10-CM | POA: Diagnosis not present

## 2018-09-03 DIAGNOSIS — Z6830 Body mass index (BMI) 30.0-30.9, adult: Secondary | ICD-10-CM | POA: Diagnosis not present

## 2018-09-03 DIAGNOSIS — I10 Essential (primary) hypertension: Secondary | ICD-10-CM | POA: Diagnosis not present

## 2018-09-03 DIAGNOSIS — E669 Obesity, unspecified: Secondary | ICD-10-CM | POA: Diagnosis not present

## 2018-09-03 DIAGNOSIS — J302 Other seasonal allergic rhinitis: Secondary | ICD-10-CM | POA: Diagnosis not present

## 2018-09-03 DIAGNOSIS — E7849 Other hyperlipidemia: Secondary | ICD-10-CM | POA: Diagnosis not present

## 2018-12-31 DIAGNOSIS — M48062 Spinal stenosis, lumbar region with neurogenic claudication: Secondary | ICD-10-CM | POA: Diagnosis not present

## 2018-12-31 DIAGNOSIS — E7849 Other hyperlipidemia: Secondary | ICD-10-CM | POA: Diagnosis not present

## 2018-12-31 DIAGNOSIS — K219 Gastro-esophageal reflux disease without esophagitis: Secondary | ICD-10-CM | POA: Diagnosis not present

## 2018-12-31 DIAGNOSIS — I10 Essential (primary) hypertension: Secondary | ICD-10-CM | POA: Diagnosis not present

## 2019-04-01 DIAGNOSIS — E7849 Other hyperlipidemia: Secondary | ICD-10-CM | POA: Diagnosis not present

## 2019-04-01 DIAGNOSIS — K219 Gastro-esophageal reflux disease without esophagitis: Secondary | ICD-10-CM | POA: Diagnosis not present

## 2019-04-01 DIAGNOSIS — M48062 Spinal stenosis, lumbar region with neurogenic claudication: Secondary | ICD-10-CM | POA: Diagnosis not present

## 2019-04-01 DIAGNOSIS — I10 Essential (primary) hypertension: Secondary | ICD-10-CM | POA: Diagnosis not present

## 2019-05-11 DIAGNOSIS — Z23 Encounter for immunization: Secondary | ICD-10-CM | POA: Diagnosis not present

## 2019-05-11 DIAGNOSIS — M48062 Spinal stenosis, lumbar region with neurogenic claudication: Secondary | ICD-10-CM | POA: Diagnosis not present

## 2019-05-11 DIAGNOSIS — I10 Essential (primary) hypertension: Secondary | ICD-10-CM | POA: Diagnosis not present

## 2019-05-11 DIAGNOSIS — Z1239 Encounter for other screening for malignant neoplasm of breast: Secondary | ICD-10-CM | POA: Diagnosis not present

## 2019-05-11 DIAGNOSIS — E7849 Other hyperlipidemia: Secondary | ICD-10-CM | POA: Diagnosis not present

## 2019-05-13 ENCOUNTER — Telehealth: Payer: Self-pay | Admitting: Specialist

## 2019-05-13 NOTE — Telephone Encounter (Signed)
Received vm from [place] w/ atty Jan Dil, checking status of request for records. IC,lmvm [phone]), advised we have not received their request. I left our fax numbers for her to fax request

## 2019-05-18 ENCOUNTER — Other Ambulatory Visit: Payer: Self-pay | Admitting: Internal Medicine

## 2019-05-18 DIAGNOSIS — Z1231 Encounter for screening mammogram for malignant neoplasm of breast: Secondary | ICD-10-CM

## 2019-06-10 ENCOUNTER — Telehealth: Payer: Self-pay | Admitting: Specialist

## 2019-06-10 NOTE — Telephone Encounter (Signed)
Received vm from [place] w/ atty Jan Dills office checking if we received request for reords. IC her back, we have not received request,I left my email for her to send that way.

## 2019-06-22 ENCOUNTER — Telehealth: Payer: Self-pay | Admitting: Specialist

## 2019-06-22 NOTE — Telephone Encounter (Signed)
Received vm from [place] w/ [place] office. IC,lmvm advising we still have not received their request. Advised I advised [place] to email form back on 11/19. I left my email and our fax numbers.

## 2019-06-23 ENCOUNTER — Encounter: Payer: Self-pay | Admitting: Specialist

## 2019-06-23 ENCOUNTER — Ambulatory Visit (INDEPENDENT_AMBULATORY_CARE_PROVIDER_SITE_OTHER): Payer: BC Managed Care – PPO | Admitting: Specialist

## 2019-06-23 ENCOUNTER — Ambulatory Visit: Payer: Self-pay

## 2019-06-23 ENCOUNTER — Other Ambulatory Visit: Payer: Self-pay

## 2019-06-23 VITALS — BP 159/72 | HR 60 | Ht 63.0 in | Wt 165.0 lb

## 2019-06-23 DIAGNOSIS — M5136 Other intervertebral disc degeneration, lumbar region: Secondary | ICD-10-CM

## 2019-06-23 DIAGNOSIS — M48062 Spinal stenosis, lumbar region with neurogenic claudication: Secondary | ICD-10-CM

## 2019-06-23 DIAGNOSIS — M4807 Spinal stenosis, lumbosacral region: Secondary | ICD-10-CM | POA: Diagnosis not present

## 2019-06-23 DIAGNOSIS — M4316 Spondylolisthesis, lumbar region: Secondary | ICD-10-CM

## 2019-06-23 MED ORDER — TRAMADOL HCL 50 MG PO TABS
50.0000 mg | ORAL_TABLET | Freq: Four times a day (QID) | ORAL | 0 refills | Status: DC | PRN
Start: 2019-06-23 — End: 2019-11-16

## 2019-06-23 MED ORDER — TRAMADOL HCL 50 MG PO TABS: 50.0000 mg | ORAL_TABLET | Freq: Four times a day (QID) | ORAL | 0 refills | Status: DC | PRN

## 2019-06-23 NOTE — Patient Instructions (Addendum)
Avoid bending, stooping and avoid lifting weights greater than 10 lbs. Avoid prolong standing and walking. Order for a new walker with wheels. Surgery scheduling secretary Kandice Hams, will call you in the next week to schedule for surgery.  Surgery recommended is a one level lumbar fusion L4-5 this would be done with rods, screws and cages with local bone graft and allograft (donor bone graft). Take tramadol for for pain. Risk of surgery includes risk of infection 1 in 200 patients, bleeding <1/2% chance you would need a transfusion.   Risk to the nerves is one in 10,000. You will need to use a brace for 3 months and wean from the brace on the 4th month. Expect improved walking and standing tolerance. Expect relief of leg pain but numbness may persist depending on the length and degree of pressure that has been present. Update MRI as preop study as her spondylolisthesis is progressing to nearly Grade 3.

## 2019-06-23 NOTE — Progress Notes (Addendum)
Office Visit Note   Patient: Shannon Williamson           Date of Birth: 08/24/1960           MRN: [id number] Visit Date: 06/23/2019              Requested by: Fleet Contras, MD [address] [place],  [place] [address] PCP: Fleet Contras, MD   Assessment & Plan: Visit Diagnoses:  1. Spinal stenosis of lumbar region with neurogenic claudication   2. Spondylolisthesis, lumbar region   3. Other intervertebral disc degeneration, lumbar region   4. Spinal stenosis of lumbosacral region   5. Spondylolisthesis of lumbar region     Plan: Avoid bending, stooping and avoid lifting weights greater than 10 lbs. Avoid prolong standing and walking. Order for a new walker with wheels. Surgery scheduling secretary Tivis Ringer, will call you in the next week to schedule for surgery.  Surgery recommended is a one level lumbar fusion L4-5 this would be done with rods, screws and cages with local bone graft and allograft (donor bone graft). Take tramadol for for pain. Risk of surgery includes risk of infection 1 in 200 patients, bleeding <1/2% chance you would need a transfusion.   Risk to the nerves is one in 10,000. You will need to use a brace for 3 months and wean from the brace on the 4th month. Expect improved walking and standing tolerance. Expect relief of leg pain but numbness may persist depending on the length and degree of pressure that has been present. Update MRI as preop study as her spondylolisthesis is progressing to nearly Grade 3.  Follow-Up Instructions: No follow-ups on file.   Orders:  Orders Placed This Encounter  Procedures  . XR Lumbar Spine 2-3 Views  . XR Lumbar Spine Complete W/Bend  . MR Lumbar Spine w/o contrast   No orders of the defined types were placed in this encounter.     Procedures: No procedures performed   Clinical Data: No additional findings.   Subjective: Chief Complaint  Patient presents with  . Lower Back - Follow-up    58  year old with history of back pain with radiation into the right anterior leg and foot and left low back, left buttock and into the left lateral thigh and calf. There is no pain with cough or sneeze.  Pain is worse with standing and walking. She reports pain at night with radiation into the legs, Some times at night she is sitting on the side of the bed with pain. No bowel or bladder difficulty. Presently taking diclofenac 75 mg bId and gabapentin 300mg  TID for pain.    Review of Systems  Constitutional: Negative for activity change, appetite change, chills, diaphoresis, fatigue, fever and unexpected weight change.  HENT: Negative.  Negative for congestion, dental problem, drooling, ear discharge, ear pain, facial swelling, hearing loss, mouth sores, nosebleeds, postnasal drip, rhinorrhea, sinus pressure, sinus pain, sneezing, sore throat, tinnitus, trouble swallowing and voice change.   Eyes: Negative.   Respiratory: Negative.  Negative for apnea, cough, choking, chest tightness, shortness of breath, wheezing and stridor.   Cardiovascular: Negative for chest pain, palpitations and leg swelling.  Gastrointestinal: Negative for abdominal distention, abdominal pain, anal bleeding, blood in stool, constipation, diarrhea, nausea, rectal pain and vomiting.  Endocrine: Negative.   Genitourinary: Negative.  Negative for decreased urine volume, vaginal bleeding and vaginal discharge.  Musculoskeletal: Positive for arthralgias, back pain and gait problem. Negative for myalgias, neck pain and  neck stiffness.  Skin: Negative.   Allergic/Immunologic: Negative.   Neurological: Positive for weakness and numbness.  Hematological: Negative for adenopathy. Does not bruise/bleed easily.  Psychiatric/Behavioral: Negative for agitation, behavioral problems, confusion, decreased concentration, dysphoric mood, hallucinations, self-injury, sleep disturbance and suicidal ideas. The patient is not nervous/anxious and is  not hyperactive.      Objective: Vital Signs: BP (!) 159/72 (BP Location: Left Arm, Patient Position: Sitting)   Pulse 60   Wt 165 lb (74.8 kg)   BMI 28.77 kg/m   Physical Exam  Ortho Exam  Specialty Comments:  No specialty comments available.  Imaging: Xr Lumbar Spine 2-3 Views  Result Date: 06/23/2019 AP and bending lateral radiographs show worsening spondylolisthesis L4-5 to nearly grade 3 with almost 50% slip. There is degenerative disc narrowing and no evidence of spondylolysis.     PMFS History: Patient Active Problem List   Diagnosis Date Noted  . Abdominal pain 02/23/2018  . Fever 02/23/2018  . Generalized body aches 02/23/2018  . Viral illness 02/23/2018  . Acute bronchitis 02/26/2017   Past Medical History:  Diagnosis Date  . Allergy   . Hypertension     History reviewed. No pertinent family history.  Past Surgical History:  Procedure Laterality Date  . CARPAL TUNNEL RELEASE Right 06/21/2013   Procedure: RIGHT CARPAL TUNNEL RELEASE;  Surgeon: Wynonia Sours, MD;  Location: [facility];  Service: Orthopedics;  Laterality: Right;  . NO PAST SURGERIES     Social History   Occupational History  . Not on file  Tobacco Use  . Smoking status: Never Smoker  . Smokeless tobacco: Never Used  Substance and Sexual Activity  . Alcohol use: No  . Drug use: No  . Sexual activity: Yes

## 2019-06-23 NOTE — Addendum Note (Signed)
Addended by: Basil Dess on: 06/23/2019 10:47 AM   Modules accepted: Orders

## 2019-07-07 ENCOUNTER — Other Ambulatory Visit: Payer: Self-pay

## 2019-07-07 ENCOUNTER — Ambulatory Visit
Admission: RE | Admit: 2019-07-07 | Discharge: 2019-07-07 | Disposition: A | Discharge status: Home / Self Care | Payer: BLUE CROSS/BLUE SHIELD | Source: Ambulatory Visit | Attending: Internal Medicine | Admitting: Internal Medicine

## 2019-07-07 DIAGNOSIS — Z1231 Encounter for screening mammogram for malignant neoplasm of breast: Secondary | ICD-10-CM

## 2019-07-14 ENCOUNTER — Encounter: Payer: Self-pay | Admitting: *Deleted

## 2019-07-22 ENCOUNTER — Encounter: Payer: Self-pay | Admitting: Specialist

## 2019-07-22 ENCOUNTER — Ambulatory Visit (INDEPENDENT_AMBULATORY_CARE_PROVIDER_SITE_OTHER): Payer: BC Managed Care – PPO | Admitting: Specialist

## 2019-07-22 ENCOUNTER — Other Ambulatory Visit: Payer: Self-pay

## 2019-07-22 VITALS — BP 150/64 | HR 69 | Ht 63.0 in | Wt 165.0 lb

## 2019-07-22 DIAGNOSIS — M4316 Spondylolisthesis, lumbar region: Secondary | ICD-10-CM

## 2019-07-22 DIAGNOSIS — M48062 Spinal stenosis, lumbar region with neurogenic claudication: Secondary | ICD-10-CM

## 2019-07-22 DIAGNOSIS — M5136 Other intervertebral disc degeneration, lumbar region: Secondary | ICD-10-CM

## 2019-07-22 NOTE — Patient Instructions (Addendum)
Plan: Avoid bending, stooping and avoid lifting weights greater than 10 lbs. Avoid prolong standing and walking. Order for a new walker with wheels. Surgery scheduling secretary Kandice Hams, will call you in the next week to schedule for surgery.  Surgery recommended is a one level lumbar fusion L4-5 this would be done with rods, screws and cages with local bone graft and allograft (donor bone graft). Take tramadol for for pain. Risk of surgery includes risk of infection 1 in 200 patients, bleeding <1/2% chance you would need a transfusion.   Risk to the nerves is one in 10,000. You will need to use a brace for 3 months and wean from the brace on the 4th month. Expect improved walking and standing tolerance. Expect relief of leg pain but numbness may persist depending on the length and degree of pressure that has been present. Update MRI as preop study as her spondylolisthesis is progressing to nearly Grade 3.   The phone number for [facility] is provided the patient and they will call to schedule the MRI. We will see them back when the MRI is done and schedule surgery at that time.  Follow-Up Instructions: No follow-ups on file

## 2019-07-22 NOTE — Progress Notes (Signed)
Office Visit Note   Patient: Shannon Williamson           Date of Birth: 1960-10-04           MRN: [id number] Visit Date: 07/22/2019              Requested by: Nolene Ebbs, MD [address] [place],  [place] [address] PCP: Nolene Ebbs, MD   Assessment & Plan: Visit Diagnoses:  1. Spinal stenosis of lumbar region with neurogenic claudication   2. Spondylolisthesis of lumbar region   3. Degenerative lumbar disc     Plan: Avoid bending, stooping and avoid lifting weights greater than 10 lbs. Avoid prolong standing and walking. Order for a new walker with wheels. Surgery scheduling secretary Kandice Hams, will call you in the next week to schedule for surgery.  Surgery recommended is a one level lumbar fusion L4-5 this would be done with rods, screws and cages with local bone graft and allograft (donor bone graft). Take tramadol for for pain. Risk of surgery includes risk of infection 1 in 200 patients, bleeding <1/2% chance you would need a transfusion.   Risk to the nerves is one in 10,000. You will need to use a brace for 3 months and wean from the brace on the 4th month. Expect improved walking and standing tolerance. Expect relief of leg pain but numbness may persist depending on the length and degree of pressure that has been present. Update MRI as preop study as her spondylolisthesis is progressing to nearly Grade 3.   The phone number for [facility] is provided the patient and they will call to schedule the MRI. We will see them back when the MRI is done and schedule surgery at that time.  Follow-Up Instructions: No follow-ups on file  Follow-Up Instructions: Return in about 3 weeks (around 08/12/2019).   Orders:  No orders of the defined types were placed in this encounter.  No orders of the defined types were placed in this encounter.     Procedures: No procedures performed   Clinical Data: No additional findings.   Subjective: Chief  Complaint  Patient presents with  . Lower Back - Follow-up, Pain     58 year old with history of back pain with radiation into the right anterior leg and foot and left low back, left buttock and into the left lateral thigh and calf. There is no pain with cough or sneeze.  Pain is worse with standing and walking. She reports pain at night with radiation into the legs, Some times at night she is sitting on the side of the bed with pain. No bowel or bladder difficulty. Presently taking diclofenac 75 mg bId and gabapentin 300mg  TID for pain. She is being scheduled for lumbar decompression and fusion for spinal stenosis associated with spondylolisthesis however her MRI is older than 27 months done in 03/2017. A new MRI is necessary prior to intervention to document her current condition and ensure no other cause for her symptoms of claudication. MRI has not been scheduled as yet due to inability to reach the patient by phone.    Review of Systems  Constitutional: Negative.   HENT: Negative.   Eyes: Negative.   Respiratory: Negative.   Cardiovascular: Negative.   Gastrointestinal: Negative.   Endocrine: Negative.   Genitourinary: Negative.   Musculoskeletal: Positive for back pain and gait problem.  Skin: Negative.   Allergic/Immunologic: Negative.   Neurological: Positive for weakness and numbness.  Hematological: Negative.  Psychiatric/Behavioral: Negative.      Objective: Vital Signs: BP (!) 150/64   Pulse 69   Ht 5\' 3"  (1.6 m)   Wt 165 lb (74.8 kg)   BMI 29.23 kg/m   Physical Exam Musculoskeletal:     Lumbar back: Negative right straight leg raise test and negative left straight leg raise test.     Back Exam   Range of Motion  Extension: abnormal  Flexion: normal  Lateral bend right: abnormal  Lateral bend left: abnormal  Rotation right: abnormal  Rotation left: abnormal   Tests  Straight leg raise right: negative Straight leg raise left: negative  Reflexes    Patellar: 2/4 Achilles: 0/4 Biceps: 0/4 Babinski's sign: normal   Other  Toe walk: normal Heel walk: abnormal Erythema: no back redness Scars: absent  Comments:  Bilateral EHL weakness 4/5. Foot DF is intact.       Specialty Comments:  No specialty comments available.  Imaging: No results found.   PMFS History: Patient Active Problem List   Diagnosis Date Noted  . Abdominal pain 02/23/2018  . Fever 02/23/2018  . Generalized body aches 02/23/2018  . Viral illness 02/23/2018  . Acute bronchitis 02/26/2017   Past Medical History:  Diagnosis Date  . Allergy   . Hypertension     History reviewed. No pertinent family history.  Past Surgical History:  Procedure Laterality Date  . CARPAL TUNNEL RELEASE Right 06/21/2013   Procedure: RIGHT CARPAL TUNNEL RELEASE;  Surgeon: 14/07/2012, MD;  Location: [facility];  Service: Orthopedics;  Laterality: Right;  . NO PAST SURGERIES     Social History   Occupational History  . Not on file  Tobacco Use  . Smoking status: Never Smoker  . Smokeless tobacco: Never Used  Substance and Sexual Activity  . Alcohol use: No  . Drug use: No  . Sexual activity: Yes

## 2019-07-23 HISTORY — PX: BACK SURGERY: SHX140

## 2019-08-04 ENCOUNTER — Ambulatory Visit
Admission: RE | Admit: 2019-08-04 | Discharge: 2019-08-04 | Disposition: A | Discharge status: Home / Self Care | Payer: BC Managed Care – PPO | Source: Ambulatory Visit | Attending: Specialist | Admitting: Specialist

## 2019-08-04 ENCOUNTER — Other Ambulatory Visit: Payer: Self-pay

## 2019-08-04 DIAGNOSIS — M5136 Other intervertebral disc degeneration, lumbar region: Secondary | ICD-10-CM

## 2019-08-04 DIAGNOSIS — M48061 Spinal stenosis, lumbar region without neurogenic claudication: Secondary | ICD-10-CM | POA: Diagnosis not present

## 2019-08-04 DIAGNOSIS — M4316 Spondylolisthesis, lumbar region: Secondary | ICD-10-CM

## 2019-08-04 DIAGNOSIS — M4807 Spinal stenosis, lumbosacral region: Secondary | ICD-10-CM

## 2019-08-10 DIAGNOSIS — Z131 Encounter for screening for diabetes mellitus: Secondary | ICD-10-CM | POA: Diagnosis not present

## 2019-08-10 DIAGNOSIS — Z0000 Encounter for general adult medical examination without abnormal findings: Secondary | ICD-10-CM | POA: Diagnosis not present

## 2019-08-10 DIAGNOSIS — I10 Essential (primary) hypertension: Secondary | ICD-10-CM | POA: Diagnosis not present

## 2019-08-10 DIAGNOSIS — E7849 Other hyperlipidemia: Secondary | ICD-10-CM | POA: Diagnosis not present

## 2019-08-10 DIAGNOSIS — E559 Vitamin D deficiency, unspecified: Secondary | ICD-10-CM | POA: Diagnosis not present

## 2019-08-10 DIAGNOSIS — M48062 Spinal stenosis, lumbar region with neurogenic claudication: Secondary | ICD-10-CM | POA: Diagnosis not present

## 2019-08-12 ENCOUNTER — Ambulatory Visit (INDEPENDENT_AMBULATORY_CARE_PROVIDER_SITE_OTHER): Payer: BC Managed Care – PPO | Admitting: Specialist

## 2019-08-12 ENCOUNTER — Other Ambulatory Visit: Payer: Self-pay

## 2019-08-12 ENCOUNTER — Encounter: Payer: Self-pay | Admitting: Specialist

## 2019-08-12 VITALS — BP 138/70 | HR 65 | Ht 63.0 in | Wt 165.0 lb

## 2019-08-12 DIAGNOSIS — M48062 Spinal stenosis, lumbar region with neurogenic claudication: Secondary | ICD-10-CM | POA: Diagnosis not present

## 2019-08-12 DIAGNOSIS — M5136 Other intervertebral disc degeneration, lumbar region: Secondary | ICD-10-CM

## 2019-08-12 DIAGNOSIS — M4316 Spondylolisthesis, lumbar region: Secondary | ICD-10-CM | POA: Diagnosis not present

## 2019-08-12 NOTE — Progress Notes (Signed)
Office Visit Note   Patient: Mikenna Bunkley           Date of Birth: 1960-12-21           MRN: [id number] Visit Date: 08/12/2019              Requested by: Fleet Contras, MD [address] [place],  [place] [address] PCP: Fleet Contras, MD   Assessment & Plan: Visit Diagnoses:  1. Spondylolisthesis of lumbar region   2. Spinal stenosis of lumbar region with neurogenic claudication   3. Degenerative lumbar disc     Plan: Avoid bending, stooping and avoid lifting weights greater than 10 lbs. Avoid prolong standing and walking. Order for a new walker with wheels. Surgery scheduling secretary Tivis Ringer, will call you in the next week to schedule for surgery.  Surgery recommended is a one level lumbar fusion L4-5 this would be done with rods, screws and cages with local bone graft and allograft (donor bone graft). Take hydrocodone for for pain. Risk of surgery includes risk of infection 1 in 200 patients, bleeding 1/2% chance you would need a transfusion.   Risk to the nerves is one in 10,000. You will need to use a brace for 3 months and wean from the brace on the 4th month. Expect improved walking and standing tolerance. Expect relief of leg pain but numbness may persist depending on the length and degree of pressure that has been present. Due to the COVID 19 pandemic, the hospitals are at a capacity that is forcing the stopping of elective surgeries, for this reason  We will take down information and seek approval from your insurance company for treatment with surgery. Tivis Ringer will Call and talk to you about the delays and when surgery will be able to be scheduled.     Follow-Up Instructions: Return in about 6 weeks (around 09/23/2019).   Orders:  No orders of the defined types were placed in this encounter.  No orders of the defined types were placed in this encounter.     Procedures: No procedures performed   Clinical Data: No additional  findings.   Subjective: Chief Complaint  Patient presents with  . Lower Back - Follow-up    MRI Review Lumbar    59 year old female with a history of lumbar pain and discomfort with standing and walking and with siitting and bending and stooping. She has a grade 1-2 anterolisthesis that is dynamic at L4-5 and a MRI was done. No bowel or bladder dysfunction. She is only able to stand or walk for about 15 minutes before she must find a place to sit. She is able to grocery shop   Review of Systems  Constitutional: Negative.   HENT: Negative.   Eyes: Negative.   Respiratory: Negative.   Cardiovascular: Negative.   Gastrointestinal: Negative.   Endocrine: Negative.   Genitourinary: Negative.   Musculoskeletal: Positive for back pain. Negative for arthralgias, gait problem, joint swelling, myalgias, neck pain and neck stiffness.  Skin: Negative.   Allergic/Immunologic: Negative.   Neurological: Negative.   Hematological: Negative.   Psychiatric/Behavioral: Negative.      Objective: Vital Signs: BP 138/70 (BP Location: Left Arm, Patient Position: Sitting)   Pulse 65   Ht 5\' 3"  (1.6 m)   Wt 165 lb (74.8 kg)   BMI 29.23 kg/m   Physical Exam Constitutional:      Appearance: She is well-developed.  HENT:     Head: Normocephalic and atraumatic.  Eyes:  Pupils: Pupils are equal, round, and reactive to light.  Pulmonary:     Effort: Pulmonary effort is normal.     Breath sounds: Normal breath sounds.  Abdominal:     General: Bowel sounds are normal.     Palpations: Abdomen is soft.  Musculoskeletal:     Cervical back: Normal range of motion and neck supple.     Lumbar back: Negative right straight leg raise test and negative left straight leg raise test.  Skin:    General: Skin is warm and dry.  Neurological:     Mental Status: She is alert and oriented to person, place, and time.  Psychiatric:        Behavior: Behavior normal.        Thought Content: Thought  content normal.        Judgment: Judgment normal.     Back Exam   Range of Motion  Extension: abnormal  Flexion: abnormal  Lateral bend right: abnormal  Rotation right: abnormal  Rotation left: abnormal   Muscle Strength  Right Quadriceps:  4/5  Left Quadriceps:  5/5  Right Hamstrings:  5/5  Left Hamstrings:  5/5   Tests  Straight leg raise right: negative Straight leg raise left: negative  Other  Toe walk: abnormal Heel walk: abnormal  Comments:  Bilateral foot DF weakness right greater than left 4/5 vs 5-/5      Specialty Comments:  No specialty comments available.  Imaging: No results found.   PMFS History: Patient Active Problem List   Diagnosis Date Noted  . Abdominal pain 02/23/2018  . Fever 02/23/2018  . Generalized body aches 02/23/2018  . Viral illness 02/23/2018  . Acute bronchitis 02/26/2017   Past Medical History:  Diagnosis Date  . Allergy   . Hypertension     No family history on file.  Past Surgical History:  Procedure Laterality Date  . CARPAL TUNNEL RELEASE Right 06/21/2013   Procedure: RIGHT CARPAL TUNNEL RELEASE;  Surgeon: Wynonia Sours, MD;  Location: [facility];  Service: Orthopedics;  Laterality: Right;  . NO PAST SURGERIES     Social History   Occupational History  . Not on file  Tobacco Use  . Smoking status: Never Smoker  . Smokeless tobacco: Never Used  Substance and Sexual Activity  . Alcohol use: No  . Drug use: No  . Sexual activity: Yes

## 2019-08-12 NOTE — Patient Instructions (Addendum)
Avoid bending, stooping and avoid lifting weights greater than 10 lbs. Avoid prolong standing and walking. Order for a new walker with wheels. Surgery scheduling secretary Tivis Ringer, will call you in the next week to schedule for surgery.  Surgery recommended is a one level lumbar fusion L4-5 this would be done with rods, screws and cages with local bone graft and allograft (donor bone graft). Take hydrocodone for for pain. Risk of surgery includes risk of infection 1 in 200 patients, bleeding 1/2% chance you would need a transfusion.   Risk to the nerves is one in 10,000. You will need to use a brace for 3 months and wean from the brace on the 4th month. Expect improved walking and standing tolerance. Expect relief of leg pain but numbness may persist depending on the length and degree of pressure that has been present. Due to the COVID 19 pandemic, the hospitals are at a capacity that is forcing the stopping of elective surgeries, for this reason  We will take down information and seek approval from your insurance company for treatment with surgery. Tivis Ringer will Call and talk to you about the delays and when surgery will be able to be scheduled.

## 2019-09-23 ENCOUNTER — Other Ambulatory Visit: Payer: Self-pay

## 2019-09-23 ENCOUNTER — Ambulatory Visit (INDEPENDENT_AMBULATORY_CARE_PROVIDER_SITE_OTHER): Payer: BC Managed Care – PPO | Admitting: Specialist

## 2019-09-23 ENCOUNTER — Encounter: Payer: Self-pay | Admitting: Specialist

## 2019-09-23 VITALS — BP 135/63 | HR 61 | Ht 63.0 in | Wt 165.0 lb

## 2019-09-23 DIAGNOSIS — M5136 Other intervertebral disc degeneration, lumbar region: Secondary | ICD-10-CM

## 2019-09-23 DIAGNOSIS — M4807 Spinal stenosis, lumbosacral region: Secondary | ICD-10-CM | POA: Diagnosis not present

## 2019-09-23 DIAGNOSIS — M4316 Spondylolisthesis, lumbar region: Secondary | ICD-10-CM | POA: Diagnosis not present

## 2019-09-23 DIAGNOSIS — M48062 Spinal stenosis, lumbar region with neurogenic claudication: Secondary | ICD-10-CM | POA: Diagnosis not present

## 2019-09-23 NOTE — Progress Notes (Addendum)
Office Visit Note   Patient: Shannon Williamson           Date of Birth: 08-Apr-1961           MRN: [id number] Visit Date: 09/23/2019              Requested by: Fleet Contras, MD [address] [place],  [place] [address] PCP: Fleet Contras, MD   Assessment & Plan: Visit Diagnoses:  1. Spinal stenosis of lumbar region with neurogenic claudication   2. Spondylolisthesis of lumbar region   3. Degenerative lumbar disc   4. Spinal stenosis of lumbosacral region     Plan: Avoid bending, stooping and avoid lifting weights greater than 10 lbs. Avoid prolong standing and walking. Avoid frequent bending and stooping  No lifting greater than 10 lbs. May use ice or moist heat for pain. Weight loss is of benefit. Handicap license is approved. Avoid bending, stooping and avoid lifting weights greater than 10 lbs. Avoid prolong standing and walking. Order for a new walker with wheels. Surgery scheduling secretary Tivis Ringer, will call you in the next week to schedule for surgery.  Surgery recommended is a decompressive laminectomy at L4-5with bilateral partial hemilaminectomies and a one level lumbar fusion left L4-5 and  this would be done with rods, screws and cages with local bone graft and allograft (donor bone graft). Take hydrocodone for for pain. Risk of surgery includes risk of infection 1 in 200 patients, bleeding 1/2% chance you would need a transfusion.   Risk to the nerves is one in 10,000. You will need to use a brace for 3 months and wean from the brace on the 4th month. Expect improved walking and standing tolerance. Expect relief of leg pain but numbness may persist depending on the length and degree of pressure that has been present.  Follow-Up Instructions: No follow-ups on file.   Orders:  No orders of the defined types were placed in this encounter.  No orders of the defined types were placed in this encounter.     Procedures: No procedures  performed   Clinical Data: No additional findings.   Subjective: Chief Complaint  Patient presents with  . Lower Back - Follow-up    59 year old female with history of back pain and leg pain into both leg right and left calves. She has neurogenic claudication with standing and walking. No bowel or bladder. She is taking diclofenac and tramadol and gabapentin. She reports that she sleeps okay sometimes and at other times not so good. There is numbness and tingling in both legs. She does not want steroid injection and did not want surgery during the time COVID was increasing but now with fewer cases she feels that she is ready for surgery.  Clinically her picture is one of spondylolisthesis with spinal stenosis and neurogenic claudication. Her MRI has show severe spinal stenosis associated with spondylolisthesis L4-5. She is not able to walk more than a half block with severe pain, unable to grocery shop.    Review of Systems  Constitutional: Negative.  Negative for activity change, appetite change, chills, diaphoresis, fatigue, fever and unexpected weight change.  HENT: Negative.  Negative for congestion, dental problem, drooling, ear discharge, ear pain, facial swelling, hearing loss, mouth sores, nosebleeds, postnasal drip, rhinorrhea, sinus pressure, sinus pain, sneezing, sore throat, tinnitus, trouble swallowing and voice change.   Eyes: Negative.   Respiratory: Negative.   Cardiovascular: Negative.  Negative for chest pain and palpitations.  Gastrointestinal: Negative.  Negative for abdominal distention, abdominal pain, anal bleeding, blood in stool, constipation, diarrhea, nausea, rectal pain and vomiting.  Endocrine: Negative.   Genitourinary: Negative.  Negative for difficulty urinating, dyspareunia, dysuria and enuresis.  Musculoskeletal: Positive for arthralgias, back pain and gait problem. Negative for joint swelling, myalgias and neck pain.  Skin: Negative.    Allergic/Immunologic: Negative.   Neurological: Negative for dizziness, tremors, seizures, syncope, facial asymmetry, speech difficulty, weakness, light-headedness, numbness and headaches.  Hematological: Negative for adenopathy. Does not bruise/bleed easily.  Psychiatric/Behavioral: Negative for agitation, behavioral problems, confusion, decreased concentration, dysphoric mood, hallucinations, self-injury, sleep disturbance and suicidal ideas. The patient is not nervous/anxious and is not hyperactive.      Objective: Vital Signs: BP 135/63 (BP Location: Left Arm, Patient Position: Sitting)   Pulse 61   Ht 5\' 3"  (1.6 m)   Wt 165 lb (74.8 kg)   BMI 29.23 kg/m   Physical Exam Constitutional:      Appearance: She is well-developed.  HENT:     Head: Normocephalic and atraumatic.  Eyes:     Pupils: Pupils are equal, round, and reactive to light.  Pulmonary:     Effort: Pulmonary effort is normal.     Breath sounds: Normal breath sounds.  Abdominal:     General: Bowel sounds are normal.     Palpations: Abdomen is soft.  Musculoskeletal:     Cervical back: Normal range of motion and neck supple.  Skin:    General: Skin is warm and dry.  Neurological:     Mental Status: She is alert and oriented to person, place, and time.  Psychiatric:        Behavior: Behavior normal.        Thought Content: Thought content normal.        Judgment: Judgment normal.     Back Exam   Tenderness  The patient is experiencing tenderness in the lumbar.  Range of Motion  Extension: abnormal  Flexion: abnormal  Lateral bend right: abnormal  Lateral bend left: abnormal  Rotation right: abnormal  Rotation left: abnormal   Muscle Strength  Right Quadriceps:  5/5  Right Hamstrings:  5/5   Reflexes  Patellar: 1/4 Achilles: 1/4  Other  Toe walk: abnormal Heel walk: abnormal Sensation: decreased Gait: abnormal  Erythema: no back redness Scars: absent  Comments:  Weak right foot  dorsiflexion, 4/5, SLR is negative.       Specialty Comments:  No specialty comments available.  Imaging: No results found.   PMFS History: Patient Active Problem List   Diagnosis Date Noted  . Abdominal pain 02/23/2018  . Fever 02/23/2018  . Generalized body aches 02/23/2018  . Viral illness 02/23/2018  . Acute bronchitis 02/26/2017   Past Medical History:  Diagnosis Date  . Allergy   . Hypertension     History reviewed. No pertinent family history.  Past Surgical History:  Procedure Laterality Date  . CARPAL TUNNEL RELEASE Right 06/21/2013   Procedure: RIGHT CARPAL TUNNEL RELEASE;  Surgeon: Wynonia Sours, MD;  Location: [facility];  Service: Orthopedics;  Laterality: Right;  . NO PAST SURGERIES     Social History   Occupational History  . Not on file  Tobacco Use  . Smoking status: Never Smoker  . Smokeless tobacco: Never Used  Substance and Sexual Activity  . Alcohol use: No  . Drug use: No  . Sexual activity: Yes

## 2019-09-23 NOTE — Patient Instructions (Addendum)
Avoid bending, stooping and avoid lifting weights greater than 10 lbs. Avoid prolong standing and walking. Avoid frequent bending and stooping  No lifting greater than 10 lbs. May use ice or moist heat for pain. Weight loss is of benefit. Handicap license is approved. Avoid bending, stooping and avoid lifting weights greater than 10 lbs. Avoid prolong standing and walking. Order for a new walker with wheels. Surgery scheduling secretary Tivis Ringer, will call you in the next week to schedule for surgery.  Surgery recommended is a decompressive laminectomy at L4-5 with bilateral partial hemilaminectomies and a one level lumbar fusion L4-5 and  this would be done with rods, screws and cages with local bone graft and allograft (donor bone graft). Take hydrocodone for for pain. Risk of surgery includes risk of infection 1 in 200 patients, bleeding 1/2% chance you would need a transfusion.   Risk to the nerves is one in 10,000. You will need to use a brace for 3 months and wean from the brace on the 4th month. Expect improved walking and standing tolerance. Expect relief of leg pain but numbness may persist depending on the length and degree of pressure that has been present.

## 2019-10-21 ENCOUNTER — Ambulatory Visit: Payer: BC Managed Care – PPO | Admitting: Specialist

## 2019-11-08 ENCOUNTER — Other Ambulatory Visit: Payer: Self-pay

## 2019-11-09 DIAGNOSIS — E7849 Other hyperlipidemia: Secondary | ICD-10-CM | POA: Diagnosis not present

## 2019-11-09 DIAGNOSIS — K219 Gastro-esophageal reflux disease without esophagitis: Secondary | ICD-10-CM | POA: Diagnosis not present

## 2019-11-09 DIAGNOSIS — M48062 Spinal stenosis, lumbar region with neurogenic claudication: Secondary | ICD-10-CM | POA: Diagnosis not present

## 2019-11-09 DIAGNOSIS — I10 Essential (primary) hypertension: Secondary | ICD-10-CM | POA: Diagnosis not present

## 2019-11-11 ENCOUNTER — Encounter (HOSPITAL_COMMUNITY): Payer: Self-pay

## 2019-11-11 ENCOUNTER — Encounter: Payer: Self-pay | Admitting: Surgery

## 2019-11-11 ENCOUNTER — Other Ambulatory Visit: Payer: Self-pay

## 2019-11-11 ENCOUNTER — Ambulatory Visit (INDEPENDENT_AMBULATORY_CARE_PROVIDER_SITE_OTHER): Payer: BC Managed Care – PPO | Admitting: Surgery

## 2019-11-11 ENCOUNTER — Encounter (HOSPITAL_COMMUNITY)
Admission: RE | Admit: 2019-11-11 | Discharge: 2019-11-11 | Disposition: A | Discharge status: Home / Self Care | Payer: BC Managed Care – PPO | Source: Ambulatory Visit | Attending: Specialist | Admitting: Specialist

## 2019-11-11 ENCOUNTER — Encounter (HOSPITAL_COMMUNITY)
Admission: RE | Admit: 2019-11-11 | Discharge: 2019-11-11 | Disposition: A | Discharge status: Home / Self Care | Payer: BC Managed Care – PPO | Source: Ambulatory Visit | Attending: Surgery | Admitting: Surgery

## 2019-11-11 ENCOUNTER — Other Ambulatory Visit (HOSPITAL_COMMUNITY)
Admission: RE | Admit: 2019-11-11 | Discharge: 2019-11-11 | Disposition: A | Discharge status: Home / Self Care | Payer: BC Managed Care – PPO | Source: Ambulatory Visit | Attending: Specialist | Admitting: Specialist

## 2019-11-11 VITALS — BP 156/68 | HR 61 | Ht 63.0 in | Wt 165.0 lb

## 2019-11-11 DIAGNOSIS — Z01818 Encounter for other preprocedural examination: Secondary | ICD-10-CM | POA: Insufficient documentation

## 2019-11-11 DIAGNOSIS — M4316 Spondylolisthesis, lumbar region: Secondary | ICD-10-CM

## 2019-11-11 DIAGNOSIS — I517 Cardiomegaly: Secondary | ICD-10-CM | POA: Diagnosis not present

## 2019-11-11 DIAGNOSIS — M48062 Spinal stenosis, lumbar region with neurogenic claudication: Secondary | ICD-10-CM

## 2019-11-11 DIAGNOSIS — R001 Bradycardia, unspecified: Secondary | ICD-10-CM | POA: Insufficient documentation

## 2019-11-11 DIAGNOSIS — Z20822 Contact with and (suspected) exposure to covid-19: Secondary | ICD-10-CM | POA: Insufficient documentation

## 2019-11-11 HISTORY — DX: Unspecified osteoarthritis, unspecified site: M19.90

## 2019-11-11 LAB — URINALYSIS, ROUTINE W REFLEX MICROSCOPIC
Bilirubin Urine: NEGATIVE
Glucose, UA: NEGATIVE mg/dL
Hgb urine dipstick: NEGATIVE
Ketones, ur: NEGATIVE mg/dL
Leukocytes,Ua: NEGATIVE
Nitrite: NEGATIVE
Protein, ur: NEGATIVE mg/dL
Specific Gravity, Urine: 1.017 (ref 1.005–1.030)
pH: 5 (ref 5.0–8.0)

## 2019-11-11 LAB — COMPREHENSIVE METABOLIC PANEL
ALT: 26 U/L (ref 0–44)
AST: 32 U/L (ref 15–41)
Albumin: 4.1 g/dL (ref 3.5–5.0)
Alkaline Phosphatase: 80 U/L (ref 38–126)
Anion gap: 11 (ref 5–15)
BUN: 17 mg/dL (ref 6–20)
CO2: 25 mmol/L (ref 22–32)
Calcium: 9.6 mg/dL (ref 8.9–10.3)
Chloride: 103 mmol/L (ref 98–111)
Creatinine, Ser: 1.06 mg/dL — ABNORMAL HIGH (ref 0.44–1.00)
GFR calc Af Amer: >60 mL/min (ref >60)
GFR calc non Af Amer: 58 mL/min — ABNORMAL LOW (ref >60)
Glucose, Bld: 111 mg/dL — ABNORMAL HIGH (ref 70–99)
Potassium: 3.7 mmol/L (ref 3.5–5.1)
Sodium: 139 mmol/L (ref 135–145)
Total Bilirubin: 0.7 mg/dL (ref 0.3–1.2)
Total Protein: 7.7 g/dL (ref 6.5–8.1)

## 2019-11-11 LAB — ABO/RH: ABO/RH(D): O POS

## 2019-11-11 LAB — CBC
HCT: 39.3 % (ref 36.0–46.0)
Hemoglobin: 12.9 g/dL (ref 12.0–15.0)
MCH: 29.9 pg (ref 26.0–34.0)
MCHC: 32.8 g/dL (ref 30.0–36.0)
MCV: 91.2 fL (ref 80.0–100.0)
Platelets: 259 10*3/uL (ref 150–400)
RBC: 4.31 MIL/uL (ref 3.87–5.11)
RDW: 12.6 % (ref 11.5–15.5)
WBC: 6.4 10*3/uL (ref 4.0–10.5)
nRBC: 0 % (ref 0.0–0.2)

## 2019-11-11 LAB — PROTIME-INR
INR: 1 (ref 0.8–1.2)
Prothrombin Time: 13.4 seconds (ref 11.4–15.2)

## 2019-11-11 LAB — SARS CORONAVIRUS 2 (TAT 6-24 HRS): SARS Coronavirus 2: NEGATIVE

## 2019-11-11 LAB — SURGICAL PCR SCREEN
MRSA, PCR: NEGATIVE
Staphylococcus aureus: NEGATIVE

## 2019-11-11 LAB — TYPE AND SCREEN
ABO/RH(D): O POS
Antibody Screen: NEGATIVE

## 2019-11-11 NOTE — Progress Notes (Signed)
PCP - Dr. Concepcion Elk Cardiologist - none  Chest x-ray - today EKG - today Stress Test - denies ECHO - denies Cardiac Cath - denies  ERAS Protcol - yes PRE-SURGERY Ensure or G2- Ensure  COVID TEST- to be done today  Anesthesia review: No  Patient denies shortness of breath, fever, cough and chest pain at PAT appointment  Pt speaks very little English. Husband, Shannon Williamson was in with her through the appointment. He speaks [place] well.    All instructions explained to the patient, with a verbal understanding of the material. Patient agrees to go over the instructions while at home for a better understanding. Patient also instructed to self quarantine after being tested for COVID-19. The opportunity to ask questions was provided.

## 2019-11-11 NOTE — Progress Notes (Signed)
59 year old female comes in with interpreter today for preop evaluation.  Patient has history of L4-5 stenosis/HNP with low back pain and left greater than right lower extremity radiculopathy.  She is wanting to proceed with TRANSFORAMINAL LUMBAR INTERBODY FUSION LEFT L4-5 WITH PEDICLE SCREWS, RODS, CAGES, LOCAL BONE GRAFT, ALLOGRAFT BONE GRAFT, VIVIGEN as scheduled.  I am not exactly quite sure as to how much English patient speaks or understands.  During the course of my review of systems patient denied having any issues but the interpreter was not assisting with my questioning.  I then asked if patient understood what I was saying and he said that she did.  Advised patient she can expect to be in the hospital for least 2 to 3 days.  All questions answered.

## 2019-11-11 NOTE — Pre-Procedure Instructions (Signed)
Shannon Williamson  11/11/2019    Your procedure is scheduled on Monday, November 15, 2019 at 7:30 AM.   Report to [facility] Entrance "A" Admitting Office at 5:30 AM.   Call this number if you have problems the morning of surgery: [phone]   Questions prior to day of surgery, please call [phone] between 8 & 4 PM.   Remember:  Do not eat food after midnight Sunday,  11/14/19.  You may drink clear liquids until 4:30 AM.  Clear liquids allowed are:  Water, Juice (non-citric and without pulp), Carbonated beverages, Clear Tea, Black Coffee only, Plain Jell-O only, Gatorade and Plain Popsicles only   Drink the Pre-Surgery Ensure between 4:15 AM and 4:30 AM day of surgery. This will be the last thing you drink prior to surgery.    Take these medicines the morning of surgery with A SIP OF WATER: Gabapentin (Neurontin), Metoprolol (Toprol XL), Tramadol (Ultram) - if needed, Loratadine (Claritin) - if needed  Stop Multivitamins and NSAIDS (Diclofenac, Voltaren, Ibuprofen, Aleve, etc) as of today prior to surgery. Do not use Aspirin products (BC Powders, Goody's, etc), Fish Oil or Herbal medications prior to surgery.    Do not wear jewelry, make-up or nail polish.  Do not wear lotions, powders, perfumes or deodorant.  Do not shave 48 hours prior to surgery.    Do not bring valuables to the hospital.  [facility] is not responsible for any belongings or valuables.  Contacts, dentures or bridgework may not be worn into surgery.  Leave your suitcase in the car.  After surgery it may be brought to your room.  For patients admitted to the hospital, discharge time will be determined by your treatment team.  [facility] - Preparing for Surgery  Before surgery, you can play an important role.  Because skin is not sterile, your skin needs to be as free of germs as possible.  You can reduce the number of germs on you skin by washing with CHG (chlorahexidine gluconate) soap before  surgery.  CHG is an antiseptic cleaner which kills germs and bonds with the skin to continue killing germs even after washing.  Oral Hygiene is also important in reducing the risk of infection.  Remember to brush your teeth with your regular toothpaste the morning of surgery.  Please DO NOT use if you have an allergy to CHG or antibacterial soaps.  If your skin becomes reddened/irritated stop using the CHG and inform your nurse when you arrive at Short Stay.  Do not shave (including legs and underarms) for at least 48 hours prior to the first CHG shower.  You may shave your face.  Please follow these instructions carefully:   1.  Shower with CHG Soap the night before surgery and the morning of Surgery.  2.  If you choose to wash your hair, wash your hair first as usual with your normal shampoo.  3.  After you shampoo, rinse your hair and body thoroughly to remove the shampoo. 4.  Use CHG as you would any other liquid soap.  You can apply chg directly to the skin and wash gently with a      scrungie or washcloth.           5.  Apply the CHG Soap to your body ONLY FROM THE NECK DOWN.   Do not use on open wounds or open sores. Avoid contact with your eyes, ears, mouth and genitals (private parts).  Wash genitals (private parts)  with your normal soap - do this prior to using CHG soap.  6.  Wash thoroughly, paying special attention to the area where your surgery will be performed.  7.  Thoroughly rinse your body with warm water from the neck down.  8.  DO NOT shower/wash with your normal soap after using and rinsing off the CHG Soap.  9.  Pat yourself dry with a clean towel.            10.  Wear clean pajamas.            11.  Place clean sheets on your bed the night of your first shower and do not sleep with pets.  Day of Surgery  Shower as above.  Do not apply any lotions/deodorants the morning of surgery.   Please wear clean clothes to the hospital. Remember to brush your teeth with  toothpaste.   Please read over the fact sheets that you were given.

## 2019-11-12 NOTE — H&P (Signed)
Shannon Williamson is an 58 y.o. female.   Chief Complaint: Back pain and lower extremity radiculopathy HPI: 59 year old female comes in with interpreter today for preop evaluation.  Patient has history of L4-5 stenosis/HNP with low back pain and left greater than right lower extremity radiculopathy.  She is wanting to proceed with TRANSFORAMINAL LUMBAR INTERBODY FUSION LEFT L4-5 WITH PEDICLE SCREWS, RODS, CAGES, LOCAL BONE GRAFT, ALLOGRAFT BONE GRAFT, VIVIGEN as scheduled.  I am not exactly quite sure as to how much English patient speaks or understands.  During the course of my review of systems patient denied having any issues but the interpreter was not assisting with my questioning.  I then asked if patient understood what I was saying and he said that she did.  Past Medical History:  Diagnosis Date  . Allergy   . Arthritis   . Hypertension     Past Surgical History:  Procedure Laterality Date  . CARPAL TUNNEL RELEASE Right 06/21/2013   Procedure: RIGHT CARPAL TUNNEL RELEASE;  Surgeon: Wynonia Sours, MD;  Location: [place];  Service: Orthopedics;  Laterality: Right;  . NO PAST SURGERIES      No family history on file. Social History:  reports that she has never smoked. She has never used smokeless tobacco. She reports that she does not drink alcohol or use drugs.  Allergies: No Known Allergies  No medications prior to admission.    Results for orders placed or performed during the hospital encounter of 11/11/19 (from the past 48 hour(s))  SARS CORONAVIRUS 2 (TAT 6-24 HRS) Nasopharyngeal Nasopharyngeal Swab     Status: None   Collection Time: 11/11/19  3:21 PM   Specimen: Nasopharyngeal Swab  Result Value Ref Range   SARS Coronavirus 2 NEGATIVE NEGATIVE    Comment: (NOTE) SARS-CoV-2 target nucleic acids are NOT DETECTED. The SARS-CoV-2 RNA is generally detectable in upper and lower respiratory specimens during the acute phase of infection. Negative results do  not preclude SARS-CoV-2 infection, do not rule out co-infections with other pathogens, and should not be used as the sole basis for treatment or other patient management decisions. Negative results must be combined with clinical observations, patient history, and epidemiological information. The expected result is Negative. Fact Sheet for Patients: SugarRoll.be Fact Sheet for Healthcare Providers: https://www.woods-mathews.com/ This test is not yet approved or cleared by the [place] FDA and  has been authorized for detection and/or diagnosis of SARS-CoV-2 by FDA under an Emergency Use Authorization (EUA). This EUA will remain  in effect (meaning this test can be used) for the duration of the COVID-19 declaration under Section 56 4(b)(1) of the Act, 21 U.S.C. section 360bbb-3(b)(1), unless the authorization is terminated or revoked sooner. Performed at [facility], [place] [address], [place], [place] [address]    DG Chest 2 View  Result Date: 11/12/2019 CLINICAL DATA:  Preoperative EXAM: CHEST - 2 VIEW COMPARISON:  02/25/2018 FINDINGS: Cardiomegaly. Both lungs are clear. The visualized skeletal structures are unremarkable. IMPRESSION: Cardiomegaly without acute abnormality of the lungs. Electronically Signed   By: Eddie Candle M.D.   On: 11/12/2019 08:40    Review of Systems  Constitutional: Positive for activity change.  HENT: Negative.   Respiratory: Negative.   Cardiovascular: Negative.   Gastrointestinal: Negative.   Genitourinary: Negative.   Musculoskeletal: Positive for back pain and gait problem.  Neurological: Positive for numbness.  Psychiatric/Behavioral: Negative.     There were no vitals taken for this visit. Physical Exam  Constitutional: She is oriented to person, place, and time. She appears well-developed.  HENT:  Head: Normocephalic and atraumatic.  Eyes: Pupils are equal, round, and reactive to light. EOM  are normal.  Cardiovascular: Normal heart sounds.  Respiratory: Effort normal and breath sounds normal. No respiratory distress.  GI: Bowel sounds are normal. She exhibits no distension. There is no abdominal tenderness.  Musculoskeletal:        General: Tenderness present.     Cervical back: Normal range of motion.     Comments: No focal motor deficits  Neurological: She is alert and oriented to person, place, and time.  Skin: Skin is warm and dry.     Assessment/Plan L4-5 stenosis/HNP, low back pain and left greater than right lower extremity radiculopathy   We will proceed with surgery as scheduled.  It was relayed to me that Dr. Otelia Sergeant had previously discussed with the surgery in detail.  All questions were answered.  Zonia Kief, PA-C 11/12/2019, 3:42 PM

## 2019-11-13 MED ORDER — BUPIVACAINE LIPOSOME 1.3 % IJ SUSP
20.0000 mL | Freq: Once | INTRAMUSCULAR | Status: DC
  Filled 2019-11-13 (×2): qty 20

## 2019-11-15 ENCOUNTER — Inpatient Hospital Stay (HOSPITAL_COMMUNITY): Payer: BC Managed Care – PPO

## 2019-11-15 ENCOUNTER — Encounter (HOSPITAL_COMMUNITY)
Admission: RE | Disposition: A | Discharge status: Home / Self Care | Payer: Self-pay | Source: Home / Self Care | Attending: Specialist

## 2019-11-15 ENCOUNTER — Encounter (HOSPITAL_COMMUNITY): Payer: Self-pay | Admitting: Specialist

## 2019-11-15 ENCOUNTER — Other Ambulatory Visit: Payer: Self-pay

## 2019-11-15 ENCOUNTER — Inpatient Hospital Stay (HOSPITAL_COMMUNITY)
Admission: RE | Admit: 2019-11-15 | Discharge: 2019-11-16 | DRG: 455 | Disposition: A | Discharge status: Home / Self Care | Payer: BC Managed Care – PPO | Attending: Specialist | Admitting: Specialist

## 2019-11-15 DIAGNOSIS — I10 Essential (primary) hypertension: Secondary | ICD-10-CM | POA: Diagnosis present

## 2019-11-15 DIAGNOSIS — M48061 Spinal stenosis, lumbar region without neurogenic claudication: Secondary | ICD-10-CM | POA: Diagnosis not present

## 2019-11-15 DIAGNOSIS — Z79899 Other long term (current) drug therapy: Secondary | ICD-10-CM

## 2019-11-15 DIAGNOSIS — Z419 Encounter for procedure for purposes other than remedying health state, unspecified: Secondary | ICD-10-CM

## 2019-11-15 DIAGNOSIS — M5416 Radiculopathy, lumbar region: Secondary | ICD-10-CM | POA: Diagnosis not present

## 2019-11-15 DIAGNOSIS — Z981 Arthrodesis status: Secondary | ICD-10-CM

## 2019-11-15 DIAGNOSIS — M4326 Fusion of spine, lumbar region: Secondary | ICD-10-CM | POA: Diagnosis not present

## 2019-11-15 DIAGNOSIS — M48062 Spinal stenosis, lumbar region with neurogenic claudication: Principal | ICD-10-CM | POA: Diagnosis present

## 2019-11-15 DIAGNOSIS — J302 Other seasonal allergic rhinitis: Secondary | ICD-10-CM | POA: Diagnosis present

## 2019-11-15 DIAGNOSIS — M4316 Spondylolisthesis, lumbar region: Secondary | ICD-10-CM | POA: Diagnosis present

## 2019-11-15 DIAGNOSIS — J209 Acute bronchitis, unspecified: Secondary | ICD-10-CM | POA: Diagnosis not present

## 2019-11-15 DIAGNOSIS — Z20822 Contact with and (suspected) exposure to covid-19: Secondary | ICD-10-CM | POA: Diagnosis present

## 2019-11-15 SURGERY — POSTERIOR LUMBAR FUSION 1 LEVEL
Anesthesia: General | Site: Spine Lumbar

## 2019-11-15 MED ORDER — CEFAZOLIN SODIUM-DEXTROSE 2-4 GM/100ML-% IV SOLN
INTRAVENOUS | Status: AC
  Filled 2019-11-15: qty 100

## 2019-11-15 MED ORDER — KETOROLAC TROMETHAMINE 15 MG/ML IJ SOLN
15.0000 mg | Freq: Four times a day (QID) | INTRAMUSCULAR | Status: AC
  Administered 2019-11-15 – 2019-11-16 (×4): 15 mg via INTRAVENOUS
  Filled 2019-11-15 (×4): qty 1

## 2019-11-15 MED ORDER — MIDAZOLAM HCL 2 MG/2ML IJ SOLN
INTRAMUSCULAR | Status: AC
  Filled 2019-11-15: qty 2

## 2019-11-15 MED ORDER — METOPROLOL SUCCINATE ER 100 MG PO TB24
100.0000 mg | ORAL_TABLET | Freq: Every day | ORAL | Status: DC
  Administered 2019-11-15 – 2019-11-16 (×2): 100 mg via ORAL
  Filled 2019-11-15 (×2): qty 1

## 2019-11-15 MED ORDER — BUPIVACAINE HCL (PF) 0.5 % IJ SOLN
INTRAMUSCULAR | Status: AC
  Filled 2019-11-15: qty 30

## 2019-11-15 MED ORDER — PROPOFOL 10 MG/ML IV BOLUS
INTRAVENOUS | Status: AC
  Filled 2019-11-15: qty 20

## 2019-11-15 MED ORDER — MENTHOL 3 MG MT LOZG: 1.0000 | LOZENGE | OROMUCOSAL | Status: DC | PRN

## 2019-11-15 MED ORDER — OXYCODONE HCL 5 MG PO TABS: 5.0000 mg | ORAL_TABLET | Freq: Once | ORAL | Status: DC | PRN

## 2019-11-15 MED ORDER — SODIUM CHLORIDE 0.9% FLUSH
3.0000 mL | Freq: Two times a day (BID) | INTRAVENOUS | Status: DC
  Administered 2019-11-15: 3 mL via INTRAVENOUS

## 2019-11-15 MED ORDER — BISACODYL 5 MG PO TBEC: 5.0000 mg | DELAYED_RELEASE_TABLET | Freq: Every day | ORAL | Status: DC | PRN

## 2019-11-15 MED ORDER — LORATADINE 10 MG PO TABS: 10.0000 mg | ORAL_TABLET | Freq: Every day | ORAL | Status: DC | PRN

## 2019-11-15 MED ORDER — SODIUM CHLORIDE 0.9 % IV SOLN: INTRAVENOUS | Status: DC

## 2019-11-15 MED ORDER — PHENOL 1.4 % MT LIQD: 1.0000 | OROMUCOSAL | Status: DC | PRN

## 2019-11-15 MED ORDER — DEXAMETHASONE SODIUM PHOSPHATE 10 MG/ML IJ SOLN
INTRAMUSCULAR | Status: DC | PRN
  Administered 2019-11-15: 5 mg via INTRAVENOUS

## 2019-11-15 MED ORDER — POLYETHYLENE GLYCOL 3350 17 G PO PACK: 17.0000 g | PACK | Freq: Every day | ORAL | Status: DC | PRN

## 2019-11-15 MED ORDER — FENTANYL CITRATE (PF) 250 MCG/5ML IJ SOLN
INTRAMUSCULAR | Status: DC | PRN
  Administered 2019-11-15: 100 ug via INTRAVENOUS
  Administered 2019-11-15: 50 ug via INTRAVENOUS
  Administered 2019-11-15: 25 ug via INTRAVENOUS
  Administered 2019-11-15: 50 ug via INTRAVENOUS
  Administered 2019-11-15: 25 ug via INTRAVENOUS

## 2019-11-15 MED ORDER — LACTATED RINGERS IV SOLN: INTRAVENOUS | Status: DC | PRN

## 2019-11-15 MED ORDER — PROPOFOL 10 MG/ML IV BOLUS
INTRAVENOUS | Status: DC | PRN
  Administered 2019-11-15: 120 mg via INTRAVENOUS

## 2019-11-15 MED ORDER — HYDROCODONE-ACETAMINOPHEN 7.5-325 MG PO TABS
1.0000 | ORAL_TABLET | Freq: Four times a day (QID) | ORAL | Status: DC
  Administered 2019-11-15 – 2019-11-16 (×5): 1 via ORAL
  Filled 2019-11-15 (×5): qty 1

## 2019-11-15 MED ORDER — FLEET ENEMA 7-19 GM/118ML RE ENEM: 1.0000 | ENEMA | Freq: Once | RECTAL | Status: DC | PRN

## 2019-11-15 MED ORDER — PANTOPRAZOLE SODIUM 40 MG IV SOLR: 40.0000 mg | Freq: Every day | INTRAVENOUS | Status: DC

## 2019-11-15 MED ORDER — ACETAMINOPHEN 10 MG/ML IV SOLN
INTRAVENOUS | Status: DC | PRN
  Administered 2019-11-15: 1000 mg via INTRAVENOUS

## 2019-11-15 MED ORDER — HYDROCHLOROTHIAZIDE 12.5 MG PO CAPS
12.5000 mg | ORAL_CAPSULE | Freq: Every day | ORAL | Status: DC
  Administered 2019-11-16: 12.5 mg via ORAL
  Filled 2019-11-15: qty 1

## 2019-11-15 MED ORDER — ACETAMINOPHEN 10 MG/ML IV SOLN: 1000.0000 mg | Freq: Once | INTRAVENOUS | Status: DC | PRN

## 2019-11-15 MED ORDER — PHENYLEPHRINE HCL-NACL 10-0.9 MG/250ML-% IV SOLN
INTRAVENOUS | Status: DC | PRN
  Administered 2019-11-15: 25 ug/min via INTRAVENOUS

## 2019-11-15 MED ORDER — ACETAMINOPHEN 500 MG PO TABS: 1000.0000 mg | ORAL_TABLET | Freq: Once | ORAL | Status: DC | PRN

## 2019-11-15 MED ORDER — ONDANSETRON HCL 4 MG/2ML IJ SOLN
INTRAMUSCULAR | Status: AC
  Filled 2019-11-15: qty 2

## 2019-11-15 MED ORDER — SUGAMMADEX SODIUM 200 MG/2ML IV SOLN
INTRAVENOUS | Status: DC | PRN
  Administered 2019-11-15: 200 mg via INTRAVENOUS

## 2019-11-15 MED ORDER — FENTANYL CITRATE (PF) 100 MCG/2ML IJ SOLN
25.0000 ug | INTRAMUSCULAR | Status: DC | PRN
  Administered 2019-11-15 (×2): 25 ug via INTRAVENOUS

## 2019-11-15 MED ORDER — 0.9 % SODIUM CHLORIDE (POUR BTL) OPTIME
TOPICAL | Status: DC | PRN
  Administered 2019-11-15 (×2): 1000 mL

## 2019-11-15 MED ORDER — FENTANYL CITRATE (PF) 100 MCG/2ML IJ SOLN
INTRAMUSCULAR | Status: AC
  Administered 2019-11-15: 50 ug via INTRAVENOUS
  Filled 2019-11-15: qty 2

## 2019-11-15 MED ORDER — CEFAZOLIN SODIUM-DEXTROSE 2-4 GM/100ML-% IV SOLN
2.0000 g | Freq: Three times a day (TID) | INTRAVENOUS | Status: AC
  Administered 2019-11-15 (×2): 2 g via INTRAVENOUS
  Filled 2019-11-15 (×2): qty 100

## 2019-11-15 MED ORDER — HEMOSTATIC AGENTS (NO CHARGE) OPTIME
TOPICAL | Status: DC | PRN
  Administered 2019-11-15: 1 via TOPICAL

## 2019-11-15 MED ORDER — OXYCODONE HCL 5 MG/5ML PO SOLN: 5.0000 mg | Freq: Once | ORAL | Status: DC | PRN

## 2019-11-15 MED ORDER — SURGIFOAM 100 EX MISC
CUTANEOUS | Status: DC | PRN
  Administered 2019-11-15: 1

## 2019-11-15 MED ORDER — ALUM & MAG HYDROXIDE-SIMETH 200-200-20 MG/5ML PO SUSP: 30.0000 mL | Freq: Four times a day (QID) | ORAL | Status: DC | PRN

## 2019-11-15 MED ORDER — DEXAMETHASONE SODIUM PHOSPHATE 10 MG/ML IJ SOLN
INTRAMUSCULAR | Status: AC
  Filled 2019-11-15: qty 1

## 2019-11-15 MED ORDER — CEFAZOLIN SODIUM-DEXTROSE 2-4 GM/100ML-% IV SOLN
2.0000 g | INTRAVENOUS | Status: AC
  Administered 2019-11-15: 2 g via INTRAVENOUS

## 2019-11-15 MED ORDER — METHOCARBAMOL 1000 MG/10ML IJ SOLN
500.0000 mg | Freq: Four times a day (QID) | INTRAVENOUS | Status: DC | PRN
  Filled 2019-11-15: qty 5

## 2019-11-15 MED ORDER — EPHEDRINE 5 MG/ML INJ
INTRAVENOUS | Status: AC
  Filled 2019-11-15: qty 10

## 2019-11-15 MED ORDER — HYDROCODONE-ACETAMINOPHEN 10-325 MG PO TABS: 2.0000 | ORAL_TABLET | ORAL | Status: DC | PRN

## 2019-11-15 MED ORDER — FENTANYL CITRATE (PF) 250 MCG/5ML IJ SOLN
INTRAMUSCULAR | Status: AC
  Filled 2019-11-15: qty 5

## 2019-11-15 MED ORDER — STERILE WATER FOR IRRIGATION IR SOLN
Status: DC | PRN
  Administered 2019-11-15: 1000 mL

## 2019-11-15 MED ORDER — MIDAZOLAM HCL 2 MG/2ML IJ SOLN
INTRAMUSCULAR | Status: DC | PRN
  Administered 2019-11-15 (×2): 1 mg via INTRAVENOUS

## 2019-11-15 MED ORDER — ONDANSETRON HCL 4 MG/2ML IJ SOLN
INTRAMUSCULAR | Status: DC | PRN
  Administered 2019-11-15: 4 mg via INTRAVENOUS

## 2019-11-15 MED ORDER — ACETAMINOPHEN 160 MG/5ML PO SOLN: 1000.0000 mg | Freq: Once | ORAL | Status: DC | PRN

## 2019-11-15 MED ORDER — ROCURONIUM BROMIDE 10 MG/ML (PF) SYRINGE
PREFILLED_SYRINGE | INTRAVENOUS | Status: AC
  Filled 2019-11-15: qty 10

## 2019-11-15 MED ORDER — ROCURONIUM BROMIDE 10 MG/ML (PF) SYRINGE
PREFILLED_SYRINGE | INTRAVENOUS | Status: DC | PRN
  Administered 2019-11-15: 80 mg via INTRAVENOUS
  Administered 2019-11-15: 10 mg via INTRAVENOUS

## 2019-11-15 MED ORDER — ADULT MULTIVITAMIN W/MINERALS CH
1.0000 | ORAL_TABLET | Freq: Every day | ORAL | Status: DC
  Administered 2019-11-16: 10:00:00 1 via ORAL
  Filled 2019-11-15: qty 1

## 2019-11-15 MED ORDER — SODIUM CHLORIDE 0.9% FLUSH: 3.0000 mL | INTRAVENOUS | Status: DC | PRN

## 2019-11-15 MED ORDER — METHOCARBAMOL 500 MG PO TABS
500.0000 mg | ORAL_TABLET | Freq: Four times a day (QID) | ORAL | Status: DC | PRN
  Administered 2019-11-15 – 2019-11-16 (×2): 500 mg via ORAL
  Filled 2019-11-15 (×2): qty 1

## 2019-11-15 MED ORDER — ONDANSETRON HCL 4 MG/2ML IJ SOLN: 4.0000 mg | Freq: Four times a day (QID) | INTRAMUSCULAR | Status: DC | PRN

## 2019-11-15 MED ORDER — LIDOCAINE 2% (20 MG/ML) 5 ML SYRINGE
INTRAMUSCULAR | Status: AC
  Filled 2019-11-15: qty 5

## 2019-11-15 MED ORDER — LIDOCAINE 2% (20 MG/ML) 5 ML SYRINGE
INTRAMUSCULAR | Status: DC | PRN
  Administered 2019-11-15: 100 mg via INTRAVENOUS

## 2019-11-15 MED ORDER — MORPHINE SULFATE (PF) 2 MG/ML IV SOLN: 2.0000 mg | INTRAVENOUS | Status: DC | PRN

## 2019-11-15 MED ORDER — DOCUSATE SODIUM 100 MG PO CAPS
100.0000 mg | ORAL_CAPSULE | Freq: Two times a day (BID) | ORAL | Status: DC
  Administered 2019-11-15 – 2019-11-16 (×2): 100 mg via ORAL
  Filled 2019-11-15 (×2): qty 1

## 2019-11-15 MED ORDER — ACETAMINOPHEN 650 MG RE SUPP: 650.0000 mg | RECTAL | Status: DC | PRN

## 2019-11-15 MED ORDER — BUPIVACAINE LIPOSOME 1.3 % IJ SUSP
INTRAMUSCULAR | Status: DC | PRN
  Administered 2019-11-15: 10 mL

## 2019-11-15 MED ORDER — EPHEDRINE SULFATE 50 MG/ML IJ SOLN
INTRAMUSCULAR | Status: DC | PRN
  Administered 2019-11-15 (×2): 5 mg via INTRAVENOUS

## 2019-11-15 MED ORDER — ONDANSETRON HCL 4 MG PO TABS: 4.0000 mg | ORAL_TABLET | Freq: Four times a day (QID) | ORAL | Status: DC | PRN

## 2019-11-15 MED ORDER — BUPIVACAINE HCL 0.5 % IJ SOLN
INTRAMUSCULAR | Status: DC | PRN
  Administered 2019-11-15: 10 mL

## 2019-11-15 MED ORDER — ACETAMINOPHEN 325 MG PO TABS: 650.0000 mg | ORAL_TABLET | ORAL | Status: DC | PRN

## 2019-11-15 MED ORDER — HYDROCODONE-ACETAMINOPHEN 7.5-325 MG PO TABS
1.0000 | ORAL_TABLET | ORAL | Status: DC | PRN
  Administered 2019-11-16: 1 via ORAL
  Filled 2019-11-15: qty 1

## 2019-11-15 MED ORDER — PANTOPRAZOLE SODIUM 40 MG PO TBEC
40.0000 mg | DELAYED_RELEASE_TABLET | Freq: Every day | ORAL | Status: DC
  Administered 2019-11-15: 40 mg via ORAL
  Filled 2019-11-15: qty 1

## 2019-11-15 MED ORDER — LISINOPRIL 20 MG PO TABS
20.0000 mg | ORAL_TABLET | Freq: Two times a day (BID) | ORAL | Status: DC
  Administered 2019-11-15 – 2019-11-16 (×2): 20 mg via ORAL
  Filled 2019-11-15 (×2): qty 1

## 2019-11-15 SURGICAL SUPPLY — 77 items
BLADE CLIPPER SURG (BLADE) IMPLANT
BONE CANC CHIPS 20CC PCAN1/4 (Bone Implant) ×3 IMPLANT
BONE VIVIGEN FORMABLE 5.4CC (Bone Implant) ×3 IMPLANT
BUR MATCHSTICK NEURO 3.0 LAGG (BURR) ×3 IMPLANT
BUR SABER RD CUTTING 3.0 (BURR) IMPLANT
BUR SABER RD CUTTING 3.0MM (BURR)
CAGE CONCORDE LOR 9X10X23 5D (Cage) ×3 IMPLANT
CHIPS CANC BONE 20CC PCAN1/4 (Bone Implant) ×1 IMPLANT
COVER BACK TABLE 80X110 HD (DRAPES) ×3 IMPLANT
COVER SURGICAL LIGHT HANDLE (MISCELLANEOUS) ×3 IMPLANT
COVER WAND RF STERILE (DRAPES) ×3 IMPLANT
DECANTER SPIKE VIAL GLASS SM (MISCELLANEOUS) ×3 IMPLANT
DERMABOND ADVANCED (GAUZE/BANDAGES/DRESSINGS) ×2
DERMABOND ADVANCED .7 DNX12 (GAUZE/BANDAGES/DRESSINGS) ×1 IMPLANT
DRAPE C-ARM 42X72 X-RAY (DRAPES) ×3 IMPLANT
DRAPE C-ARMOR (DRAPES) ×3 IMPLANT
DRAPE MICROSCOPE LEICA (MISCELLANEOUS) ×3 IMPLANT
DRAPE POUCH INSTRU U-SHP 10X18 (DRAPES) ×3 IMPLANT
DRAPE SURG 17X23 STRL (DRAPES) ×12 IMPLANT
DRSG MEPILEX BORDER 4X4 (GAUZE/BANDAGES/DRESSINGS) IMPLANT
DRSG MEPILEX BORDER 4X8 (GAUZE/BANDAGES/DRESSINGS) ×3 IMPLANT
DURAPREP 26ML APPLICATOR (WOUND CARE) ×3 IMPLANT
ELECT BLADE 4.0 EZ CLEAN MEGAD (MISCELLANEOUS) ×3
ELECT BLADE 6.5 EXT (BLADE) IMPLANT
ELECT CAUTERY BLADE 6.4 (BLADE) ×3 IMPLANT
ELECT REM PT RETURN 9FT ADLT (ELECTROSURGICAL) ×3
ELECTRODE BLDE 4.0 EZ CLN MEGD (MISCELLANEOUS) ×1 IMPLANT
ELECTRODE REM PT RTRN 9FT ADLT (ELECTROSURGICAL) ×1 IMPLANT
EVACUATOR 1/8 PVC DRAIN (DRAIN) IMPLANT
GAUZE SPONGE 4X4 12PLY STRL (GAUZE/BANDAGES/DRESSINGS) ×3 IMPLANT
GAUZE SPONGE 4X4 12PLY STRL LF (GAUZE/BANDAGES/DRESSINGS) ×3 IMPLANT
GAUZE XEROFORM 1X8 LF (GAUZE/BANDAGES/DRESSINGS) ×3 IMPLANT
GLOVE BIOGEL PI IND STRL 8 (GLOVE) ×1 IMPLANT
GLOVE BIOGEL PI INDICATOR 8 (GLOVE) ×2
GLOVE ECLIPSE 9.0 STRL (GLOVE) ×3 IMPLANT
GLOVE ORTHO TXT STRL SZ7.5 (GLOVE) ×3 IMPLANT
GLOVE SURG 8.5 LATEX PF (GLOVE) ×3 IMPLANT
GOWN STRL REUS W/ TWL LRG LVL3 (GOWN DISPOSABLE) ×1 IMPLANT
GOWN STRL REUS W/TWL 2XL LVL3 (GOWN DISPOSABLE) ×6 IMPLANT
GOWN STRL REUS W/TWL LRG LVL3 (GOWN DISPOSABLE) ×3
KIT BASIN OR (CUSTOM PROCEDURE TRAY) ×3 IMPLANT
KIT POSITION SURG JACKSON T1 (MISCELLANEOUS) ×3 IMPLANT
KIT TURNOVER KIT B (KITS) ×3 IMPLANT
NEEDLE 22X1 1/2 (OR ONLY) (NEEDLE) ×3 IMPLANT
NEEDLE SPNL 18GX3.5 QUINCKE PK (NEEDLE) ×3 IMPLANT
NS IRRIG 1000ML POUR BTL (IV SOLUTION) ×3 IMPLANT
PACK LAMINECTOMY ORTHO (CUSTOM PROCEDURE TRAY) ×3 IMPLANT
PAD ARMBOARD 7.5X6 YLW CONV (MISCELLANEOUS) ×6 IMPLANT
PATTIES SURGICAL .75X.75 (GAUZE/BANDAGES/DRESSINGS) ×3 IMPLANT
PATTIES SURGICAL 1X1 (DISPOSABLE) ×3 IMPLANT
ROD PRE LORDOSED 5.5X45 (Rod) ×6 IMPLANT
SCREW CORT FIX FEN 5.5X7X40MM (Screw) ×6 IMPLANT
SCREW SET SINGLE INNER (Screw) ×12 IMPLANT
SCREW VIPER 7X45MM (Screw) ×6 IMPLANT
SPOGE SURGIFLO 8M (HEMOSTASIS)
SPONGE LAP 4X18 RFD (DISPOSABLE) ×3 IMPLANT
SPONGE SURGIFLO 8M (HEMOSTASIS) IMPLANT
SPONGE SURGIFOAM ABS GEL 100 (HEMOSTASIS) ×3 IMPLANT
STAPLER VISISTAT 35W (STAPLE) ×3 IMPLANT
SUT VIC AB 0 CT1 27 (SUTURE) ×3
SUT VIC AB 0 CT1 27XBRD ANBCTR (SUTURE) ×1 IMPLANT
SUT VIC AB 1 CTX 36 (SUTURE) ×6
SUT VIC AB 1 CTX36XBRD ANBCTR (SUTURE) ×2 IMPLANT
SUT VIC AB 2-0 CT1 27 (SUTURE)
SUT VIC AB 2-0 CT1 TAPERPNT 27 (SUTURE) IMPLANT
SUT VIC AB 3-0 X1 27 (SUTURE) IMPLANT
SYR 20ML LL LF (SYRINGE) IMPLANT
SYR CONTROL 10ML LL (SYRINGE) ×3 IMPLANT
TAP CANN VIPER2 DL 5.0 (TAP) ×3 IMPLANT
TAP CANN VIPER2 DL 6.0 (TAP) ×3 IMPLANT
TAP CANN VIPER2 DL 7.0 (TAP) ×3 IMPLANT
TAP EXPEDIUM DL 4.35 (INSTRUMENTS) ×3 IMPLANT
TOWEL GREEN STERILE (TOWEL DISPOSABLE) ×3 IMPLANT
TOWEL GREEN STERILE FF (TOWEL DISPOSABLE) ×3 IMPLANT
TRAY FOLEY MTR SLVR 16FR STAT (SET/KITS/TRAYS/PACK) ×3 IMPLANT
WATER STERILE IRR 1000ML POUR (IV SOLUTION) ×3 IMPLANT
YANKAUER SUCT BULB TIP NO VENT (SUCTIONS) ×3 IMPLANT

## 2019-11-15 NOTE — Transfer of Care (Signed)
Immediate Anesthesia Transfer of Care Note  Patient: Shannon Williamson  Procedure(s) Performed: TRANSFORAMINAL LUMBAR INTERBODY FUSION LEFT L4-5 WITH PEDICLE SCREWS, RODS, CAGES, LOCAL BONE GRAFT, ALLOGRAFT BONE GRAFT, VIVIGEN (N/A Spine Lumbar)  Patient Location: PACU  Anesthesia Type:General  Level of Consciousness: drowsy  Airway & Oxygen Therapy: Patient Spontanous Breathing  Post-op Assessment: Report given to RN and Post -op Vital signs reviewed and stable  Post vital signs: Reviewed and stable  Last Vitals:  Vitals Value Taken Time  BP    Temp    Pulse 73 11/15/19 1132  Resp 14 11/15/19 1132  SpO2 96 % 11/15/19 1132  Vitals shown include unvalidated device data.  Last Pain:  Vitals:   11/15/19 0652  TempSrc:   PainSc: 8       Patients Stated Pain Goal: 3 (11/15/19 3220)  Complications: No apparent anesthesia complications

## 2019-11-15 NOTE — Anesthesia Procedure Notes (Signed)
Procedure Name: Intubation Date/Time: 11/15/2019 7:50 AM Performed by: Dairl Ponder, CRNA Pre-anesthesia Checklist: Patient identified, Emergency Drugs available, Suction available and Patient being monitored Patient Re-evaluated:Patient Re-evaluated prior to induction Oxygen Delivery Method: Circle System Utilized Preoxygenation: Pre-oxygenation with 100% oxygen Induction Type: IV induction Ventilation: Mask ventilation without difficulty Grade View: Grade I Tube type: Oral Tube size: 7.0 mm Number of attempts: 1 Airway Equipment and Method: Stylet and Oral airway Placement Confirmation: ETT inserted through vocal cords under direct vision,  positive ETCO2 and breath sounds checked- equal and bilateral Secured at: 22 cm Tube secured with: Tape Dental Injury: Teeth and Oropharynx as per pre-operative assessment  Comments: Inserted by Elijah Birk

## 2019-11-15 NOTE — Evaluation (Signed)
Physical Therapy Evaluation Patient Details Name: Shannon Williamson MRN: [id number] DOB: April 13, 1961 Today's Date: 11/15/2019   History of Present Illness  pt is a 59 y/o female admitted for lumber surgery for back pain and LE radiculopathy due to L45 spondylolisthesis.  Pt s/p L45 TLIF with screws, rods and bone grafting.  Clinical Impression  Pt admitted with/for lumbar fusion surgery.  Pt needing min assist overall for basic mobility and gait.  Education initiated and needs to be reinforced.  Pt currently limited functionally due to the problems listed below.  (see problems list.)  Pt will benefit from PT to maximize function and safety to be able to get home safely with available assist.     Follow Up Recommendations No PT follow up;Supervision/Assistance - 24 hour;Other (comment)(family was asking about HHPT)    Equipment Recommendations  Rolling walker with 5" wheels    Recommendations for Other Services       Precautions / Restrictions Precautions Precautions: Fall Required Braces or Orthoses: Spinal Brace Spinal Brace: Lumbar corset;Applied in sitting position Restrictions Weight Bearing Restrictions: No      Mobility  Bed Mobility Overal bed mobility: Needs Assistance Bed Mobility: Rolling;Sidelying to Sit;Sit to Sidelying Rolling: Min assist Sidelying to sit: Mod assist     Sit to sidelying: Min assist General bed mobility comments: cued for log roll, building momentum.  needed min assist with light mod coming up from L UE  Transfers Overall transfer level: Needs assistance   Transfers: Sit to/from Stand Sit to Stand: Min assist            Ambulation/Gait Ambulation/Gait assistance: Min assist Gait Distance (Feet): 75 Feet Assistive device: IV Pole;1 person hand held assist Gait Pattern/deviations: Step-through pattern Gait velocity: slow Gait velocity interpretation: <1.31 ft/sec, indicative of household ambulator General Gait Details: short  tentative steps  Stairs            Wheelchair Mobility    Modified Rankin (Stroke Patients Only)       Balance Overall balance assessment: Needs assistance   Sitting balance-Leahy Scale: Good       Standing balance-Leahy Scale: Fair                               Pertinent Vitals/Pain Pain Assessment: Faces Faces Pain Scale: Hurts little more Pain Location: back incision Pain Descriptors / Indicators: Grimacing;Guarding;Sore Pain Intervention(s): Monitored during session;Limited activity within patient's tolerance    Home Living Family/patient expects to be discharged to:: Private residence Living Arrangements: Spouse/significant other Available Help at Discharge: Family;Available 24 hours/day(for 1 week) Type of Home: House Home Access: Stairs to enter Entrance Stairs-Rails: Psychiatric nurse of Steps: 3 Home Layout: One level Home Equipment: None      Prior Function Level of Independence: Independent         Comments: cruised furniture recently before surgery     Hand Dominance        Extremity/Trunk Assessment   Upper Extremity Assessment Upper Extremity Assessment: Overall WFL for tasks assessed    Lower Extremity Assessment Lower Extremity Assessment: Overall WFL for tasks assessed    Cervical / Trunk Assessment Cervical / Trunk Assessment: Other exceptions(surgical back)  Communication   Communication: No difficulties;Prefers language other than English  Cognition Arousal/Alertness: Awake/alert Behavior During Therapy: WFL for tasks assessed/performed Overall Cognitive Status: Within Functional Limits for tasks assessed  General Comments General comments (skin integrity, edema, etc.): pt/husband instructed in back care/prec, log roll, lifting restrictions, bracing issues, progression of activity.    Exercises     Assessment/Plan    PT Assessment  Patient needs continued PT services  PT Problem List Decreased activity tolerance;Decreased mobility;Decreased knowledge of use of DME;Decreased knowledge of precautions;Pain       PT Treatment Interventions      PT Goals (Current goals can be found in the Care Plan section)  Acute Rehab PT Goals Patient Stated Goal: pt did not state PT Goal Formulation: With patient/family Time For Goal Achievement: 11/18/19 Potential to Achieve Goals: Good    Frequency Min 5X/week   Barriers to discharge        Co-evaluation               AM-PAC PT "6 Clicks" Mobility  Outcome Measure Help needed turning from your back to your side while in a flat bed without using bedrails?: A Little Help needed moving from lying on your back to sitting on the side of a flat bed without using bedrails?: A Lot Help needed moving to and from a bed to a chair (including a wheelchair)?: A Little Help needed standing up from a chair using your arms (e.g., wheelchair or bedside chair)?: A Little Help needed to walk in hospital room?: A Little Help needed climbing 3-5 steps with a railing? : A Little 6 Click Score: 17    End of Session   Activity Tolerance: Patient tolerated treatment well;Patient limited by pain Patient left: in bed;with call bell/phone within reach;with family/visitor present Nurse Communication: Mobility status PT Visit Diagnosis: Other abnormalities of gait and mobility (R26.89);Pain Pain - part of body: (back)    Time: 6203-5597 PT Time Calculation (min) (ACUTE ONLY): 31 min   Charges:   PT Evaluation $PT Eval Moderate Complexity: 1 Mod PT Treatments $Gait Training: 8-22 mins        11/15/2019  Jacinto Halim., PT Acute Rehabilitation Services [phone]  (pager) [phone]  (office)  Shannon Williamson 11/15/2019, 4:46 PM

## 2019-11-15 NOTE — Anesthesia Preprocedure Evaluation (Addendum)
Anesthesia Evaluation  Patient identified by MRN, date of birth, ID band Patient awake    Reviewed: Allergy & Precautions, NPO status , Patient's Chart, lab work & pertinent test results, reviewed documented beta blocker date and time   History of Anesthesia Complications Negative for: history of anesthetic complications  Airway Mallampati: III  TM Distance: >3 FB Neck ROM: Full    Dental  (+) Dental Advisory Given, Teeth Intact   Pulmonary neg shortness of breath, neg sleep apnea, neg COPD, neg recent URI,    breath sounds clear to auscultation       Cardiovascular hypertension, Pt. on medications and Pt. on home beta blockers (-) angina(-) Past MI and (-) CHF (-) dysrhythmias  Rhythm:Regular     Neuro/Psych  Neuromuscular disease negative psych ROS   GI/Hepatic negative GI ROS, Neg liver ROS,   Endo/Other  negative endocrine ROS  Renal/GU negative Renal ROS     Musculoskeletal  (+) Arthritis ,   Abdominal   Peds  Hematology negative hematology ROS (+)   Anesthesia Other Findings   Reproductive/Obstetrics                            Anesthesia Physical Anesthesia Plan  ASA: II  Anesthesia Plan: General   Post-op Pain Management:    Induction: Intravenous  PONV Risk Score and Plan: 3 and Ondansetron and Dexamethasone  Airway Management Planned: Oral ETT  Additional Equipment: None  Intra-op Plan:   Post-operative Plan: Extubation in OR  Informed Consent: I have reviewed the patients History and Physical, chart, labs and discussed the procedure including the risks, benefits and alternatives for the proposed anesthesia with the patient or authorized representative who has indicated his/her understanding and acceptance.     Dental advisory given  Plan Discussed with: CRNA and Surgeon  Anesthesia Plan Comments:         Anesthesia Quick Evaluation

## 2019-11-15 NOTE — Brief Op Note (Signed)
11/15/2019  11:11 AM  PATIENT:  Shannon Williamson  59 y.o. female  PRE-OPERATIVE DIAGNOSIS:  Grade II-III spondylolisthesis L4-5, spinal stenosis  POST-OPERATIVE DIAGNOSIS:  Grade II-III spondylolisthesis L4-5, spinal stenosis  PROCEDURE:  Procedure(s): TRANSFORAMINAL LUMBAR INTERBODY FUSION LEFT L4-5 WITH PEDICLE SCREWS, RODS, CAGES, LOCAL BONE GRAFT, ALLOGRAFT BONE GRAFT, VIVIGEN (N/A)  SURGEON:  Surgeon(s) and Role:    * Kerrin Champagne, MD - Primary  PHYSICIAN ASSISTANT: Zonia Kief, PA-C  ANESTHESIA:   local and general  EBL:  100 mL   BLOOD ADMINISTERED:none  DRAINS: Urinary Catheter (Foley)   LOCAL MEDICATIONS USED:  MARCAINE 0.5% 1:1 EXPAREL 1.3% Amount: 20 ml  SPECIMEN:  No Specimen  DISPOSITION OF SPECIMEN:  N/A  COUNTS:  YES  TOURNIQUET:  * No tourniquets in log *  DICTATION: .Dragon Dictation  PLAN OF CARE: Admit to inpatient   PATIENT DISPOSITION:  PACU - hemodynamically stable.   Delay start of Pharmacological VTE agent (>24hrs) due to surgical blood loss or risk of bleeding: yes

## 2019-11-15 NOTE — Progress Notes (Signed)
Orthopedic Tech Progress Note Patient Details:  Shannon Williamson March 05, 1961 [id number] RN said patient has brace  Patient ID: Shannon Williamson, female   DOB: 1961/05/11, 59 y.o.   MRN: [id number]   Donald Pore 11/15/2019, 2:10 PM

## 2019-11-15 NOTE — Op Note (Signed)
11/15/2019  11:16 AM  PATIENT:  Shannon Williamson  59 y.o. female  MRN: [id number]  OPERATIVE REPORT  PRE-OPERATIVE DIAGNOSIS:  Grade II-III spondylolisthesis L4-5, spinal stenosis  POST-OPERATIVE DIAGNOSIS:  Grade II-III spondylolisthesis L4-5, spinal stenosis  PROCEDURE:  Procedure(s): TRANSFORAMINAL LUMBAR INTERBODY FUSION LEFT L4-5 WITH PEDICLE SCREWS, RODS, CAGES, LOCAL BONE GRAFT, ALLOGRAFT BONE GRAFT, VIVIGEN    SURGEON:  Jessy Oto, MD     ASSISTANT:  Benjiman Core, PA-C  (Present throughout the entire procedure and necessary for completion of procedure in a timely manner)     ANESTHESIA:  General,supplemented with local marcaine 1:1 exparel, total 20cc. Dr. Ermalene Postin.    COMPLICATIONS:  None.   EBL: 150CC  DRAINS: Foley to SD.    COMPONENTS  Implant Name Type Inv. Item Serial No. Manufacturer Lot No. LRB No. Used Action  BONE VIVIGEN FORMABLE 5.4CC - [id number] Bone Implant BONE VIVIGEN FORMABLE 5.4CC [id number] [employer]  N/A 1 Implanted  SCREW CORT FIX FEN 5.5X7X40MM - [id number] Screw SCREW CORT FIX FEN 5.5X7X40MM  [employer]  N/A 2 Implanted  SJCR 1210 ACCENT SR RF [id number]   [id number] [employer]HYTH[employer] M  N/A 1 Implanted  SCREW SET SINGLE INNER - [id number] Screw SCREW SET SINGLE INNER  [employer]  N/A 4 Implanted  SCREW VIPER 7X45MM - [id number] Screw SCREW VIPER 7X45MM  [employer]  N/A 2 Implanted  CAGE CONCORDE LOR [id number] - [id number] Cage CAGE CONCORDE LOR [id number] 5D  [employer]  N/A 1 Implanted  BONE CANC CHIPS 20CC - [id number] Bone Implant BONE CANC CHIPS 20CC [id number] [employer]  N/A 1 Implanted  ROD PRE LORDOSED 5.5X45 - [id number] Rod ROD PRE LORDOSED 5.5X45  [employer]  N/A 2 Implanted  :   PROCEDURE: The patient was met in the holding area, and the appropriate lumbar levels leftt L4-5 identified and marked with an "X" and my  initials. I had discussion with the patient in the preop holding area regarding a change of consent form.The fusion levels are reidentified as L4-5. Patient understands the rationale to perform TLIFs at two levels to decompress the L4-5  lateral recess and foramenal stenosis and to allow for stablization of L4-5 anterolisthesis. The patient was then transported to OR and was placed under general anestheticwithout difficulty. The patient received appropriate preoperative antibiotic prophylaxis 2 gm Ancef.  Nursing staff inserted a Foley catheter under sterile conditions. The patient was then turned to a prone position using the [place] spine frame. PAS. all pressure points well padded the arms at the side to 90 90. Standard prep with DuraPrep solution draped in the usual manner from the lower dorsal spine the mid sacral segment. Iodine Vi-Drape was used and the old incision scar was marked. Time-out procedure was called and correct. Skin in the midline between L4 and L5 was then infiltrated with local anesthesia, marcaine 1/2% 1:1 exparel 1.3% total 20 cc used. Incision was then made  extending from L3-L5  through the skin and subcutaneous layers down to the patient's lumbodorsal fascia and spinous processes. The incision then carried sharply excising the supraspinous ligament and then continuing the lateral aspect of the spinous processes of L3, L4, and L5. Cobb elevator used to carefully elevate the paralumbar muscles off of the posterior elements using electrocautery carefully drilled bleeding and perform dissection of the muscle tissues of the preserving the facet capsule at the  L3-4. Continuing the exposure out laterally to expose the lateral margin of the facet joint line at L3-4, L4-5 and L5-S1. Incision was carried in the midline down to the S1 level area bleeders controlled using electrocautery monopolar electrocautery.  With a match stick 17m burr the L4-5 level observed on lateral radiograph the left  pedicle of L4 identified. Spinous processes of L4 and L5 marked with  OR marking pen sterilely the insight self retaining retractor was used for the upper part of the incision and the cerebellar retractor distally. C-arm fluoroscopy was then brought into the field and using C-arm fluoroscopy then a hole made into the medial aspect of the left pedicle of L4 observed in the pedicle using C arm at the 5 oclock position on the left L4 pedicle nerve probe initial entry was determined on fluoroscopy to be good position alignment so that a 4.062mtap was passed to 40 mm within the left L4 pedicle to a depth of nearly 40 mm observed on C-arm fluoroscopy to be beyond the midpoint of the lumbar vertebra and then position alignment within the left L4 pedicle this was then removed and the pedicle channel probed demonstrating patency no sign of rupture the cortex of the pedicle. Tapping with a 4 mm screw tap then 5 mm tap then 89m75mnd 7mm61mps then 7.0 mm x 40 mm screw was brought onto the field to beplaced on the left side at the L4 level after completion of the TLI . C-arm fluoroscopy was then brought into the field and using C-arm fluoroscopy then a hole made into the posterior medial aspect of the pedicle of right L4 observed in the pedicle using ball tipped nerve hook and hockey stick nerve probe initial entry was determined on fluoroscopy to be good position alignment so that 4.0mm 47m was then used to tap the right L4 pedicle to a depth of nearly 40 mm observed on C-arm fluoroscopy to be beyond the midpoint of the lumbar vertebra and then position alignment within the right L4 pedicle this was then removed and the pedicle channel probed demonstrating patency no sign of rupture the cortex of the pedicle. Tapping with a 5 mm screw tap, then 6.0mm t69m 7.0mm ta49m then 7.0 mm x 40 mm screw was placed on the right side at the L4 level. C-arm fluoroscopy was then brought into the field and using C-arm fluoroscopy then a hole  made into the posterior and medial aspect of the left pedicle of L5 observed in the pedicle using ball tipped nerve hook and hockey stick nerve probe initial entry was determined on fluoroscopy to be good position alignment so that a 4.0mm tap689ms then used to tap the left L5 pedicle to a depth of nearly 45 mm observed on C-arm fluoroscopy to be beyond the posterior one third of the lumbar vertebra and good position alignment within the left L5 pedicle this was then removed and the pedicle channel probed demonstrating patency no sign of rupture the cortex of the pedicle. Tapping with a 5 mm screw tap then a 6 mm and 7 mm tap and a 7.0mm x 4538m screw was placed on the table for later placement on left side at the L5 level following the TLIF portion of the case. The pedicle channel of L5 on the left probed demonstrating patency no sign of rupture the cortex of the pedicle. Viper screw for fixation of this level was measured as 7.0 mm x 45 mm screw so  was placed on the left side at the L5 level. C-arm fluoroscopy was then brought into the field and using C-arm fluoroscopy then a hole made into the posterior and medial aspect of the right pedicle of L5 observed in the pedicle using ball tipped nerve hook and hockey stick nerve probe initial entry was determined on fluoroscopy to be good position alignment so that a 4.63m tap was then a 563m 93m33mnd 7 mm tap used to tap the right L5 pedicle to a depth of nearly 45 mm observed on C-arm fluoroscopy to be beyond the posterior one third of the lumbar vertebra and good position alignment within the right L5 pedicle this was then removed and the pedicle channel probed demonstrating patency no sign of rupture the cortex of the pedicle. Then a 7.0mm34m45 mm screw was placed on the right side at the L5 level. The pedicle channel of L5 on the right probed demonstrating patency no sign of rupture the cortex of the pedicle.  The lower 50% of the spinous process of L4 lamina on  the left side was resected with 1/4 inch osteotome and Leksell rongeur. The left medial  facet of L4-5  was resected in order to decompress the left side of the lumbar thecal sac at  L4-5 decompressing the left L4 and L5 neuroforamen. Osteotomes and 2mm 19m 3mm k493misons were used for this portion of the decompression. Continued laminectomy was carried out resecting the central portions of the lamina of L4 performing foraminotomies on the left side at the L4 and L5 levels. The inferior articular process L4 was resected on the left side. The L5 nerve root identified bilaterally and the medial aspect of the L5 pedicle. Superior articular process of left L5 was then resected from the right side further decompressing the right L5 nerve and providing for exposure of the area just superior to the L5 pedicle for a placement of cage.A large amount of hypertrophic ligmentum flavum was found impressing on the left lateral recesses at L4-5 narrowing the respective L4 and L5 neuroforamen. Loupe magnification and headlight were used during the initial portion procedure the the OR microscope with a draped sterilely and brought into the field.  Attention then turned to placement of the transforaminal lumbar interbody fusion cage. Using a Penfield 4 the left lateral aspect of the thecal sac at the L4-5 disc space was carefully freed up The thecal sac could then easily be retracted in the posterior lateral aspect of the L4-5 disc was exposed 15 blade scalpel used to incise the posterolateral disc and an osteotome used to resect a small portion of bone off the superior aspect of the posterior superior vertebral body of L5 in order to ease the entry into the L4-5 disc space. A  4mm ke87mson rongeur was then able to be introduced in the disc space debrided easily. 9 mm dilator shaver was used to dialate the L4-5 disc space on the left side then using small curettes and the disc space was debrided of degenerative disc and cartilage  endplates present in the endplates debrided to bleeding endplate bone. Shavers were inserted to trial the intervertebral disc space. A 10 mm x 23 mm Synthes ProTi cage was carefully packed with morcellized bone graft and vivigen the local bone had been harvested from the previous laminotomy.The cage was then inserted with the articulating insertion handle in the correct orientation.  Additional bone graft was then packed into the intervertebral disc space. Bleeding controlled using bipolar electrocautery  thrombin soaked gel cottonoids.  Observed on C-arm fluoroscopy to be in good position alignment. The cage at L4-5 was placed anteriorly as best as possible the anterolisthesis prevented the use of the larger cage. With this then the transforaminal lumbar interbody fusion portion of the case was completed bleeders were controlled using bipolar electrocautery thrombin-soaked Gelfoam were appropriate.Decortication of the facet joints carried out right L4-5. These were packed with cancellous local bone graft. The 2 pedicle screws on the left corticofixation screws were each placed and then each fastener carefully aligned  to allow for placement of rods. The left side first quarter inch titanium precontoured 20m rod was then then placed into the pedicle screws on the left extending from L4-L5 each of the caps were carefully placed loosely tightened. Attention turned to the right side were similarly and then screws were carefully adjusted for placement of fixation of the rod a quarter inch 45 mm precontoured titanium rod. This was able to be inserted into the left pedicle screw fasteners, Caps onto the L4 and L5 fasteners were tightened to 80 foot lbs. Across the left side  L4-5  screw fasteners compression was obtained on the left side between L4 and L5 compressing between the fasteners and tightening the screw caps 85 pounds. Similarly this was done on the right side at L4-5obtaining compression and tightened 85 pounds.  Irrigation was carried out with copious amounts of saline solution this was done throughout the case. Cell Saver was used during the case.Hockey stick neuroprobe was used to probe the neuroforamen bilateral L4, and L5 these were determined to be well decompressed. Permanent C-arm images were obtained in AP and lateral plane and oblique planes. Remaining local bone graft was then applied along both lateral posterior lateral region extending from L4 to L5 facet beds.Gelfoam was then removed spinal canal the lumbodorsal musculature carefully exam debrided of any devitalized tissue following removal of the Insight retractors were the bleeders were controlled using electrocautery and the area dorsal lumbar muscle were then approximated in the midline with interrupted #1 Vicryl sutures loose the dorsal fascia was reattached to the spinous process of L3 to superiorly and S1 inferiorly this was done with #1 Vicryl sutures. Subcutaneous layers then approximated using interrupted 0 Vicryl sutures and 2-0 Vicryl sutures. Skin was closed with a running subcutaneous stitch of 4-0 Vicryl Dermabond was applied then MedPlex bandage. All instrument and sponge counts were correct. The patient was then returned to a supine position on her bed reactivated extubated and returned to the recovery room in satisfactory condition.   JBenjiman Core PA-C perform the duties of assistant surgeon during this case. He was present from the beginning of the case to the end of the case assisting in transfer the patient from his stretcher to the OR table and back to the stretcher at the end of the case. Assisted in careful retraction and suction of the laminectomy site delicate neural structures operating under the operating room microscope. He performed closure of the incision from the fascia to the skin applying the dressing.         JBasil Dess 11/15/2019, 11:16 AM

## 2019-11-15 NOTE — Interval H&P Note (Signed)
History and Physical Interval Note:  11/15/2019 7:42 AM  Shannon Williamson  has presented today for surgery, with the diagnosis of Grade II-III spondylolisthesis L4-5, spinal stenosis.  The various methods of treatment have been discussed with the patient and family. After consideration of risks, benefits and other options for treatment, the patient has consented to  Procedure(s): TRANSFORAMINAL LUMBAR INTERBODY FUSION LEFT L4-5 WITH PEDICLE SCREWS, RODS, CAGES, LOCAL BONE GRAFT, ALLOGRAFT BONE GRAFT, VIVIGEN (N/A) as a surgical intervention.  The patient's history has been reviewed, patient examined, no change in status, stable for surgery.  I have reviewed the patient's chart and labs.  Questions were answered to the patient's satisfaction.     Vira Browns

## 2019-11-16 LAB — CBC
HCT: 32.2 % — ABNORMAL LOW (ref 36.0–46.0)
Hemoglobin: 10.6 g/dL — ABNORMAL LOW (ref 12.0–15.0)
MCH: 30.1 pg (ref 26.0–34.0)
MCHC: 32.9 g/dL (ref 30.0–36.0)
MCV: 91.5 fL (ref 80.0–100.0)
Platelets: 203 10*3/uL (ref 150–400)
RBC: 3.52 MIL/uL — ABNORMAL LOW (ref 3.87–5.11)
RDW: 12.8 % (ref 11.5–15.5)
WBC: 12.4 10*3/uL — ABNORMAL HIGH (ref 4.0–10.5)
nRBC: 0 % (ref 0.0–0.2)

## 2019-11-16 LAB — BASIC METABOLIC PANEL
Anion gap: 9 (ref 5–15)
BUN: 21 mg/dL — ABNORMAL HIGH (ref 6–20)
CO2: 23 mmol/L (ref 22–32)
Calcium: 8.8 mg/dL — ABNORMAL LOW (ref 8.9–10.3)
Chloride: 107 mmol/L (ref 98–111)
Creatinine, Ser: 1.19 mg/dL — ABNORMAL HIGH (ref 0.44–1.00)
GFR calc Af Amer: 58 mL/min — ABNORMAL LOW (ref >60)
GFR calc non Af Amer: 50 mL/min — ABNORMAL LOW (ref >60)
Glucose, Bld: 112 mg/dL — ABNORMAL HIGH (ref 70–99)
Potassium: 3.9 mmol/L (ref 3.5–5.1)
Sodium: 139 mmol/L (ref 135–145)

## 2019-11-16 MED ORDER — METHOCARBAMOL 500 MG PO TABS: 500.0000 mg | ORAL_TABLET | Freq: Four times a day (QID) | ORAL | 1 refills | Status: DC | PRN

## 2019-11-16 MED ORDER — HYDROCODONE-ACETAMINOPHEN 7.5-325 MG PO TABS: 1.0000 | ORAL_TABLET | ORAL | 0 refills | Status: DC | PRN

## 2019-11-16 NOTE — Progress Notes (Signed)
     Subjective: 1 Day Post-Op Procedure(s) (LRB): TRANSFORAMINAL LUMBAR INTERBODY FUSION LEFT L4-5 WITH PEDICLE SCREWS, RODS, CAGES, LOCAL BONE GRAFT, ALLOGRAFT BONE GRAFT, VIVIGEN (N/A) Awake, alert and oriented x4. Tolerating po pain meds. She is walking and feels better post L4-5 fusion. May be discharged home today.  Patient reports pain as moderate.    Objective:   VITALS:  Temp:  [98 F (36.7 C)-98.7 F (37.1 C)] 98.5 F (36.9 C) (04/27 0733) Pulse Rate:  [59-64] 60 (04/27 0733) Resp:  [16-20] 16 (04/27 0733) BP: (106-137)/(43-66) 127/56 (04/27 0733) SpO2:  [99 %-100 %] 99 % (04/27 0733)  Neurologically intact ABD soft Neurovascular intact Sensation intact distally Intact pulses distally Dorsiflexion/Plantar flexion intact Incision: no drainage   LABS Recent Labs    11/16/19 0431  HGB 10.6*  WBC 12.4*  PLT 203   Recent Labs    11/16/19 0431  NA 139  K 3.9  CL 107  CO2 23  BUN 21*  CREATININE 1.19*  GLUCOSE 112*   No results for input(s): LABPT, INR in the last 72 hours.   Assessment/Plan: 1 Day Post-Op Procedure(s) (LRB): TRANSFORAMINAL LUMBAR INTERBODY FUSION LEFT L4-5 WITH PEDICLE SCREWS, RODS, CAGES, LOCAL BONE GRAFT, ALLOGRAFT BONE GRAFT, VIVIGEN (N/A)  Advance diet Up with therapy D/C IV fluids Discharge home with home health  Vira Browns 11/16/2019, 2:03 PMPatient ID: Shannon Williamson, female   DOB: 06-Jun-1961, 59 y.o.   MRN: [id number]

## 2019-11-16 NOTE — Progress Notes (Signed)
Patient is discharged from room 3C08 at this time. Alert and in stable condition. IV site d/c'[d and instructions read to patient and spouse with understanding verbalized. Left unit via wheelchair with all belongings at side. 

## 2019-11-16 NOTE — Evaluation (Signed)
Occupational Therapy Evaluation and Discharge Patient Details Name: Shannon Williamson MRN: [id number] DOB: 03/04/1961 Today's Date: 11/16/2019    History of Present Illness pt is a 59 y/o female admitted for lumber surgery for back pain and LE radiculopathy due to L45 spondylolisthesis.  Pt s/p L45 TLIF with screws, rods and bone grafting.   Clinical Impression   PTA patient independent. Admitted for above and limited by problem list below, including post op back pain, impaired balance, and decreased activity tolerance. She was educated on back precautions, brace mgmt, safety, ADL compensatory techniques, recommendations, and DME.  Patients spouse present during session, translated per preference.  Patient requires min guard for transfers and in room mobility, min assist to supervision for ADLs.  She will have support of spouse at dc 24/7 for 1 week.  Patient/spouse verbalize understanding with education.  Patient no further OT needs at this time, OT will sign off.     Follow Up Recommendations  No OT follow up;Supervision/Assistance - 24 hour(inital)    Equipment Recommendations  3 in 1 bedside commode    Recommendations for Other Services       Precautions / Restrictions Precautions Precautions: Fall;Back Precaution Booklet Issued: Yes (comment) Precaution Comments: reviewed with patient, min cueing to adhere with twisting  Required Braces or Orthoses: Spinal Brace Spinal Brace: Lumbar corset;Applied in sitting position Restrictions Weight Bearing Restrictions: No      Mobility Bed Mobility Overal bed mobility: Needs Assistance Bed Mobility: Rolling;Sidelying to Sit Rolling: Supervision Sidelying to sit: Supervision       General bed mobility comments: for safety, cueing for log roll technique   Transfers Overall transfer level: Needs assistance Equipment used: 1 person hand held assist Transfers: Sit to/from Stand Sit to Stand: Min guard         General  transfer comment: min guard for safety/balance, pt reaching out for UE support    Balance Overall balance assessment: Needs assistance Sitting-balance support: No upper extremity supported;Feet supported Sitting balance-Leahy Scale: Good     Standing balance support: No upper extremity supported;During functional activity;Single extremity supported Standing balance-Leahy Scale: Fair Standing balance comment: pt preference to UE support, reaching for therapist or furniture while walking                            ADL either performed or assessed with clinical judgement   ADL Overall ADL's : Needs assistance/impaired     Grooming: Min guard;Standing   Upper Body Bathing: Set up;Sitting   Lower Body Bathing: Min guard;Cueing for compensatory techniques;Cueing for back precautions;Sit to/from stand   Upper Body Dressing : Standing;Sitting;Minimal assistance Upper Body Dressing Details (indicate cue type and reason): min assist for brace mgmt  Lower Body Dressing: Min guard;Minimal assistance;Cueing for compensatory techniques;Cueing for back precautions;Sit to/from stand Lower Body Dressing Details (indicate cue type and reason): able to don underwear seated, educated on use of slide on shoes; will need a little help with socks  Toilet Transfer: Min guard;Ambulation Toilet Transfer Details (indicate cue type and reason): pt reaching out for hand held support     Tub/ Shower Transfer: Min guard;Walk-in shower;Ambulation;3 in 1 Tub/Shower Transfer Details (indicate cue type and reason): simulated in room , educated on technique for side stepping over threshold  Functional mobility during ADLs: Min guard General ADL Comments: pt educated on back precautions, brace mgmt and wear scheudle, safety, recommendations, ADL compesnatory techniques and DME      Vision  Perception     Praxis      Pertinent Vitals/Pain Pain Assessment: Faces Faces Pain Scale: Hurts  little more Pain Location: back incision Pain Descriptors / Indicators: Grimacing;Sore;Operative site guarding Pain Intervention(s): Monitored during session;Repositioned;Limited activity within patient's tolerance     Hand Dominance     Extremity/Trunk Assessment Upper Extremity Assessment Upper Extremity Assessment: Overall WFL for tasks assessed   Lower Extremity Assessment Lower Extremity Assessment: Defer to PT evaluation   Cervical / Trunk Assessment Cervical / Trunk Assessment: Other exceptions Cervical / Trunk Exceptions: s/p back surgery   Communication Communication Communication: No difficulties;Prefers language other than English(spouse present and translating per preference )   Cognition Arousal/Alertness: Awake/alert Behavior During Therapy: WFL for tasks assessed/performed Overall Cognitive Status: Within Functional Limits for tasks assessed                                     General Comments  pt and husband instructed and provide with handout for back precautions, ADL compesnatory techniques and recommendations     Exercises     Shoulder Instructions      Home Living Family/patient expects to be discharged to:: Private residence Living Arrangements: Spouse/significant other Available Help at Discharge: Family;Available 24 hours/day(for 1 week) Type of Home: House Home Access: Stairs to enter [employer] of Steps: 3 Entrance Stairs-Rails: Right;Left Home Layout: One level     Bathroom Shower/Tub: Chief Strategy Officer: Standard     Home Equipment: None          Prior Functioning/Environment Level of Independence: Independent        Comments: cruised furniture recently before surgery, independent ADLs, limited IADLS         OT Problem List: Decreased activity tolerance;Impaired balance (sitting and/or standing);Decreased knowledge of use of DME or AE;Decreased knowledge of precautions;Pain       OT Treatment/Interventions:      OT Goals(Current goals can be found in the care plan section) Acute Rehab OT Goals Patient Stated Goal: to get home and feel better OT Goal Formulation: With patient Time For Goal Achievement: 11/30/19 Potential to Achieve Goals: Good  OT Frequency:     Barriers to D/C:            Co-evaluation              AM-PAC OT "6 Clicks" Daily Activity     Outcome Measure Help from another person eating meals?: None Help from another person taking care of personal grooming?: A Little Help from another person toileting, which includes using toliet, bedpan, or urinal?: A Little Help from another person bathing (including washing, rinsing, drying)?: A Little Help from another person to put on and taking off regular upper body clothing?: A Little Help from another person to put on and taking off regular lower body clothing?: A Little 6 Click Score: 19   End of Session Equipment Utilized During Treatment: Back brace Nurse Communication: Mobility status;Precautions  Activity Tolerance: Patient tolerated treatment well Patient left: in chair;with call bell/phone within reach;with family/visitor present  OT Visit Diagnosis: Unsteadiness on feet (R26.81);Pain Pain - Right/Left: (back) Pain - part of body: (back)                Time: 7824-2353 OT Time Calculation (min): 20 min Charges:  OT General Charges $OT Visit: 1 Visit OT Evaluation $OT Eval Low Complexity: 1 Low  Lorene Dy  B, [place] Pager [phone] Office [phone]   Delight Stare 11/16/2019, 10:01 AM

## 2019-11-16 NOTE — Progress Notes (Signed)
Physical Therapy Treatment Patient Details Name: Shannon Williamson MRN: [id number] DOB: 01-14-1961 Today's Date: 11/16/2019    History of Present Illness pt is a 60 y/o female admitted for lumber surgery for back pain and LE radiculopathy due to L45 spondylolisthesis.  Pt s/p L45 TLIF with screws, rods and bone grafting.    PT Comments    Patient progressing well towards PT goals. Improved ambulation distance today requiring Min A without use of RW but able to improve balance/endurance with BUE support using RW. Tolerated stair training with Min A (HHA) for safety. Encouraged pt to have spouse present to assist with stair negotiation at home, pt agreeable. Reviewed back precautions, brace application, positioning, walking program and log roll technique. Pt eager to return home. Will follow.    Follow Up Recommendations  No PT follow up;Supervision/Assistance - 24 hour;Supervision for mobility/OOB(initial)     Equipment Recommendations  Rolling walker with 5" wheels    Recommendations for Other Services       Precautions / Restrictions Precautions Precautions: Fall;Back Precaution Booklet Issued: Yes (comment) Precaution Comments: Reviewed back precautions. Able to recall 2/3. Required Braces or Orthoses: Spinal Brace Spinal Brace: Lumbar corset;Applied in sitting position Restrictions Weight Bearing Restrictions: No    Mobility  Bed Mobility Overal bed mobility: Needs Assistance Bed Mobility: Rolling;Sidelying to Sit Rolling: Supervision Sidelying to sit: Supervision       General bed mobility comments: Sitting EOB upon PT arrival. Reviewed log roll technique.  Transfers Overall transfer level: Needs assistance Equipment used: None Transfers: Sit to/from Stand Sit to Stand: Min guard         General transfer comment: min guard for safety/balance, pt reaching out for UE support. Stood from [employer].  Ambulation/Gait Ambulation/Gait assistance: Min assist;Min  guard Gait Distance (Feet): 400 Feet Assistive device: 1 person hand held assist;Rolling walker (2 wheeled) Gait Pattern/deviations: Step-through pattern;Decreased stride length Gait velocity: decrease Gait velocity interpretation: <1.31 ft/sec, indicative of household ambulator General Gait Details: Initially HHA (Min A) for support progressing to min guard with use of RW. Mildly unsteady but no overt LOB. Cues for RW proximity.   Stairs Stairs: Yes Stairs assistance: Min assist Stair Management: One rail Right;Step to pattern Number of Stairs: 4 General stair comments: Cues for technique and Min A (HHA) for support.   Wheelchair Mobility    Modified Rankin (Stroke Patients Only)       Balance Overall balance assessment: Needs assistance Sitting-balance support: No upper extremity supported;Feet supported Sitting balance-Leahy Scale: Good     Standing balance support: During functional activity Standing balance-Leahy Scale: Fair Standing balance comment: pt preference to UE support in standing.                            Cognition Arousal/Alertness: Awake/alert Behavior During Therapy: WFL for tasks assessed/performed Overall Cognitive Status: Within Functional Limits for tasks assessed                                        Exercises      General Comments General comments (skin integrity, edema, etc.): pt and husband instructed and provide with handout for back precautions, ADL compesnatory techniques and recommendations       Pertinent Vitals/Pain Pain Assessment: Faces Faces Pain Scale: Hurts little more Pain Location: back incision Pain Descriptors / Indicators: Grimacing;Sore;Operative site guarding Pain Intervention(s):  Repositioned;Monitored during session    Home Living Family/patient expects to be discharged to:: Private residence Living Arrangements: Spouse/significant other Available Help at Discharge: Family;Available  24 hours/day(for 1 week) Type of Home: House Home Access: Stairs to enter Entrance Stairs-Rails: Right;Left Home Layout: One level Home Equipment: None      Prior Function Level of Independence: Independent      Comments: cruised furniture recently before surgery, independent ADLs, limited IADLS    PT Goals (current goals can now be found in the care plan section) Acute Rehab PT Goals Patient Stated Goal: to get home and feel better Progress towards PT goals: Progressing toward goals    Frequency    Min 5X/week      PT Plan Current plan remains appropriate    Co-evaluation              AM-PAC PT "6 Clicks" Mobility   Outcome Measure  Help needed turning from your back to your side while in a flat bed without using bedrails?: A Little Help needed moving from lying on your back to sitting on the side of a flat bed without using bedrails?: A Little Help needed moving to and from a bed to a chair (including a wheelchair)?: A Little Help needed standing up from a chair using your arms (e.g., wheelchair or bedside chair)?: A Little Help needed to walk in hospital room?: A Little Help needed climbing 3-5 steps with a railing? : A Little 6 Click Score: 18    End of Session Equipment Utilized During Treatment: Back brace Activity Tolerance: Patient tolerated treatment well Patient left: in bed;with call bell/phone within reach(sitting EOB) Nurse Communication: Mobility status PT Visit Diagnosis: Other abnormalities of gait and mobility (R26.89);Pain Pain - part of body: (back)     Time: 4854-6270 PT Time Calculation (min) (ACUTE ONLY): 16 min  Charges:  $Gait Training: 8-22 mins                     Vale Haven, PT, DPT Acute Rehabilitation Services Pager [phone] Office [phone]       Shannon Williamson 11/16/2019, 10:58 AM

## 2019-11-17 ENCOUNTER — Other Ambulatory Visit: Payer: Self-pay

## 2019-11-17 ENCOUNTER — Emergency Department (HOSPITAL_COMMUNITY)
Admission: EM | Admit: 2019-11-17 | Discharge: 2019-11-17 | Disposition: A | Discharge status: Home / Self Care | Payer: BC Managed Care – PPO | Attending: Emergency Medicine | Admitting: Emergency Medicine

## 2019-11-17 ENCOUNTER — Emergency Department (HOSPITAL_COMMUNITY): Payer: BC Managed Care – PPO

## 2019-11-17 DIAGNOSIS — R52 Pain, unspecified: Secondary | ICD-10-CM | POA: Diagnosis not present

## 2019-11-17 DIAGNOSIS — I10 Essential (primary) hypertension: Secondary | ICD-10-CM | POA: Diagnosis not present

## 2019-11-17 DIAGNOSIS — R42 Dizziness and giddiness: Secondary | ICD-10-CM | POA: Diagnosis not present

## 2019-11-17 DIAGNOSIS — R05 Cough: Secondary | ICD-10-CM | POA: Diagnosis not present

## 2019-11-17 DIAGNOSIS — R1030 Lower abdominal pain, unspecified: Secondary | ICD-10-CM | POA: Diagnosis not present

## 2019-11-17 DIAGNOSIS — M5489 Other dorsalgia: Secondary | ICD-10-CM | POA: Diagnosis not present

## 2019-11-17 DIAGNOSIS — N858 Other specified noninflammatory disorders of uterus: Secondary | ICD-10-CM | POA: Diagnosis not present

## 2019-11-17 LAB — COMPREHENSIVE METABOLIC PANEL
ALT: 18 U/L (ref 0–44)
AST: 40 U/L (ref 15–41)
Albumin: 3.6 g/dL (ref 3.5–5.0)
Alkaline Phosphatase: 70 U/L (ref 38–126)
Anion gap: 8 (ref 5–15)
BUN: 15 mg/dL (ref 6–20)
CO2: 28 mmol/L (ref 22–32)
Calcium: 9.1 mg/dL (ref 8.9–10.3)
Chloride: 104 mmol/L (ref 98–111)
Creatinine, Ser: 0.88 mg/dL (ref 0.44–1.00)
GFR calc Af Amer: >60 mL/min (ref >60)
GFR calc non Af Amer: >60 mL/min (ref >60)
Glucose, Bld: 95 mg/dL (ref 70–99)
Potassium: 4.1 mmol/L (ref 3.5–5.1)
Sodium: 140 mmol/L (ref 135–145)
Total Bilirubin: 1.5 mg/dL — ABNORMAL HIGH (ref 0.3–1.2)
Total Protein: 7.1 g/dL (ref 6.5–8.1)

## 2019-11-17 LAB — CBC WITH DIFFERENTIAL/PLATELET
Abs Immature Granulocytes: 0.11 10*3/uL — ABNORMAL HIGH (ref 0.00–0.07)
Basophils Absolute: 0 10*3/uL (ref 0.0–0.1)
Basophils Relative: 0 %
Eosinophils Absolute: 0 10*3/uL (ref 0.0–0.5)
Eosinophils Relative: 0 %
HCT: 32 % — ABNORMAL LOW (ref 36.0–46.0)
Hemoglobin: 10.7 g/dL — ABNORMAL LOW (ref 12.0–15.0)
Immature Granulocytes: 1 %
Lymphocytes Relative: 21 %
Lymphs Abs: 2.4 10*3/uL (ref 0.7–4.0)
MCH: 30.4 pg (ref 26.0–34.0)
MCHC: 33.4 g/dL (ref 30.0–36.0)
MCV: 90.9 fL (ref 80.0–100.0)
Monocytes Absolute: 0.9 10*3/uL (ref 0.1–1.0)
Monocytes Relative: 8 %
Neutro Abs: 8.2 10*3/uL — ABNORMAL HIGH (ref 1.7–7.7)
Neutrophils Relative %: 70 %
Platelets: 235 10*3/uL (ref 150–400)
RBC: 3.52 MIL/uL — ABNORMAL LOW (ref 3.87–5.11)
RDW: 12.9 % (ref 11.5–15.5)
WBC: 11.6 10*3/uL — ABNORMAL HIGH (ref 4.0–10.5)
nRBC: 0 % (ref 0.0–0.2)

## 2019-11-17 LAB — LIPASE, BLOOD: Lipase: 24 U/L (ref 11–51)

## 2019-11-17 LAB — URINALYSIS, ROUTINE W REFLEX MICROSCOPIC
Bilirubin Urine: NEGATIVE
Glucose, UA: NEGATIVE mg/dL
Hgb urine dipstick: NEGATIVE
Ketones, ur: NEGATIVE mg/dL
Leukocytes,Ua: NEGATIVE
Nitrite: NEGATIVE
Protein, ur: NEGATIVE mg/dL
Specific Gravity, Urine: 1.027 (ref 1.005–1.030)
pH: 7 (ref 5.0–8.0)

## 2019-11-17 MED ORDER — MORPHINE SULFATE (PF) 4 MG/ML IV SOLN
4.0000 mg | Freq: Once | INTRAVENOUS | Status: AC
  Administered 2019-11-17: 18:00:00 4 mg via INTRAVENOUS
  Filled 2019-11-17: qty 1

## 2019-11-17 MED ORDER — IOHEXOL 300 MG/ML  SOLN
100.0000 mL | Freq: Once | INTRAMUSCULAR | Status: AC | PRN
  Administered 2019-11-17: 100 mL via INTRAVENOUS

## 2019-11-17 MED ORDER — ONDANSETRON HCL 4 MG/2ML IJ SOLN
4.0000 mg | Freq: Once | INTRAMUSCULAR | Status: AC
  Administered 2019-11-17: 4 mg via INTRAVENOUS
  Filled 2019-11-17: qty 2

## 2019-11-17 MED FILL — Heparin Sodium (Porcine) Inj 1000 Unit/ML: INTRAMUSCULAR | Qty: 30 | Status: AC

## 2019-11-17 MED FILL — Sodium Chloride IV Soln 0.9%: INTRAVENOUS | Qty: 1000 | Status: AC

## 2019-11-17 NOTE — ED Triage Notes (Signed)
Pt arrived by [facility]. Pt had back procedure done yesterday and dc home. Has not been taking her pain meds and having severe back pain.

## 2019-11-17 NOTE — ED Notes (Signed)
X-ray at bedside

## 2019-11-17 NOTE — Discharge Instructions (Signed)
You were seen in the emergency department today with lower abdominal pain and dizziness.  Your CT scan did not show any acute findings and only showed expected swelling after surgery.  Please return immediately if you develop worsening pain, fever, chest pain, or trouble breathing.  Also return for any numbness or weakness in the legs.  Please keep your regularly scheduled appointment with your orthopedic surgeon.  For constipation, you should take over-the-counter MiraLAX and/or stool softener Colace.

## 2019-11-17 NOTE — ED Provider Notes (Signed)
Emergency Department Provider Note   I have reviewed the triage vital signs and the nursing notes.   HISTORY  Chief Complaint Back Pain and Post-op Problem  Husband interpreting for patient at her request. Offered audio [name].   HPI Shannon Williamson is a 59 y.o. female with PMH of HTN and lumbar spine spinal stenosis status post lumbar fusion L4/5 with Dr. Otelia Sergeant on 2/26 presents to the emergency department with some dizziness, chills, lower abdominal discomfort, cough.  Symptoms began last night.  The nurse triage note mentions worsening lower back pain which the patient denies to me.  She tells me that her pain has stayed consistent.  She is not experiencing numbness or weakness in the legs.  The husband denies noticing any fevers.  Patient states that she has been feeling lightheaded and had some chills earlier.  She is also developed some lower abdominal discomfort.  No dysuria, hesitancy, urgency.  The husband is also noticed that she has been coughing some phlegm.  She denies any chest pain or shortness of breath.  No unilateral leg swelling. No radiation of symptoms or modifying factors.   Past Medical History:  Diagnosis Date   Allergy    Arthritis    Hypertension     Patient Active Problem List   Diagnosis Date Noted   Spondylolisthesis, lumbar region 11/15/2019    Class: Chronic   Spinal stenosis, lumbar region with neurogenic claudication 11/15/2019    Class: Chronic   S/P lumbar spinal fusion 11/15/2019   Abdominal pain 02/23/2018   Fever 02/23/2018   Generalized body aches 02/23/2018   Viral illness 02/23/2018   Acute bronchitis 02/26/2017    Past Surgical History:  Procedure Laterality Date   CARPAL TUNNEL RELEASE Right 06/21/2013   Procedure: RIGHT CARPAL TUNNEL RELEASE;  Surgeon: Nicki Reaper, MD;  Location: [facility];  Service: Orthopedics;  Laterality: Right;   NO PAST SURGERIES      Allergies Patient has no  known allergies.  No family history on file.  Social History Social History   Tobacco Use   Smoking status: Never Smoker   Smokeless tobacco: Never Used  Substance Use Topics   Alcohol use: No   Drug use: No    Review of Systems  Constitutional: No fever/chills. Positive lightheadedness.  Eyes: No visual changes. ENT: No sore throat. Cardiovascular: Denies chest pain. Respiratory: Denies shortness of breath. Positive cough.  Gastrointestinal: Positive lower abdominal pain.  No nausea, no vomiting.  No diarrhea.  No constipation. Genitourinary: Negative for dysuria. Musculoskeletal: Baseline back pain since surgery.  Skin: Negative for rash. Neurological: Negative for headaches, focal weakness or numbness.  10-point ROS otherwise negative.  ____________________________________________   PHYSICAL EXAM:  VITAL SIGNS: ED Triage Vitals  Enc Vitals Group     BP 11/17/19 0938 (!) 129/51     Pulse Rate 11/17/19 0938 61     Resp 11/17/19 0938 16     Temp 11/17/19 0938 98.7 F (37.1 C)     Temp Source 11/17/19 0938 Oral     SpO2 11/17/19 0937 100 %     Weight 11/17/19 0938 165 lb (74.8 kg)     Height 11/17/19 0938 5\' 3"  (1.6 m)   Constitutional: Alert and oriented. Well appearing and in no acute distress. Eyes: Conjunctivae are normal. Head: Atraumatic. Nose: No congestion/rhinnorhea. Mouth/Throat: Mucous membranes are moist.  Oropharynx non-erythematous. Neck: No stridor.   Cardiovascular: Normal rate, regular rhythm. Good peripheral circulation. Grossly normal  heart sounds.   Respiratory: Normal respiratory effort.  No retractions. Lungs CTAB. Gastrointestinal: Soft with mild lower abdominal tenderness bilaterally.  No focal tenderness, rebound, guarding. No distention.  Musculoskeletal: No gross deformities of extremities. Neurologic:  Normal speech and language. No gross focal neurologic deficits are appreciated.  Skin:  Skin is warm, dry and intact. No rash  noted.  Wound dressing in place over the lumbar spine.  No surrounding cellulitis or focal tenderness.  No drainage on the dressing.    ____________________________________________   LABS (all labs ordered are listed, but only abnormal results are displayed)  Labs Reviewed  COMPREHENSIVE METABOLIC PANEL - Abnormal; Notable for the following components:      Result Value   Total Bilirubin 1.5 (*)    All other components within normal limits  CBC WITH DIFFERENTIAL/PLATELET - Abnormal; Notable for the following components:   WBC 11.6 (*)    RBC 3.52 (*)    Hemoglobin 10.7 (*)    HCT 32.0 (*)    Neutro Abs 8.2 (*)    Abs Immature Granulocytes 0.11 (*)    All other components within normal limits  URINALYSIS, ROUTINE W REFLEX MICROSCOPIC - Abnormal; Notable for the following components:   Color, Urine STRAW (*)    All other components within normal limits  URINE CULTURE  LIPASE, BLOOD   ____________________________________________  RADIOLOGY  CT ABDOMEN PELVIS W CONTRAST  Result Date: 11/17/2019 CLINICAL DATA:  Severe back pain after having back procedure done yesterday. EXAM: CT ABDOMEN AND PELVIS WITH CONTRAST TECHNIQUE: Multidetector CT imaging of the abdomen and pelvis was performed using the standard protocol following bolus administration of intravenous contrast. CONTRAST:  123mL OMNIPAQUE IOHEXOL 300 MG/ML  SOLN COMPARISON:  None. FINDINGS: Lower chest: No acute abnormality. Hepatobiliary: No focal liver abnormality is seen. No gallstones, gallbladder wall thickening, or biliary dilatation. Pancreas: Unremarkable. No pancreatic ductal dilatation or surrounding inflammatory changes. Spleen: Normal in size without focal abnormality. Adrenals/Urinary Tract: Adrenal glands are unremarkable. Kidneys are normal, without renal calculi, focal lesion, or hydronephrosis. Bladder is unremarkable. Stomach/Bowel: Stomach is within normal limits. Appendix appears normal. No evidence of bowel  wall thickening, distention, or inflammatory changes. Vascular/Lymphatic: No significant vascular findings are present. No enlarged abdominal or pelvic lymph nodes. Reproductive: A 1.9 cm diameter round well-defined hyperdense focus is seen within the anterior lateral aspect of the uterus on the right (axial CT image 56, CT series number 3). Other: No abdominal wall hernia or abnormality. No abdominopelvic ascites. Musculoskeletal: Moderate to marked severity diffuse subcutaneous inflammatory fat stranding is seen along the posterior aspect of the lumbar spine. This extends from the approximate level of the T10 vertebral body to the level of L4. Several tiny foci of associated soft tissue air are seen. There is no evidence of associated fluid collection, abscess or hematoma. Multiple midline posterior skin staples are seen. Bilateral metallic density pedicle screws are seen at the levels of L4 and L5 vertebral bodies. There is associated streak artifact and subsequently limited evaluation of the adjacent osseous and soft tissue structures. IMPRESSION: 1. Moderate to marked severity diffuse cellulitis and postoperative changes, seen involving the soft tissues along the posterior aspect of the lumbar spine. 2. No evidence of associated fluid collection, abscess or hematoma. 3. Postoperative changes within the lower lumbar spine. 4. Round hyperdense focus within the lateral aspect of the uterus. While this may represent a uterine fibroid, aneurysmal dilatation of an arterial structure cannot be excluded. Correlation with pelvic ultrasound is recommended.  Electronically Signed   By: Aram Candela M.D.   On: 11/17/2019 20:10   DG Chest Portable 1 View  Result Date: 11/17/2019 CLINICAL DATA:  Cough EXAM: PORTABLE CHEST 1 VIEW COMPARISON:  11/11/2019 FINDINGS: Cardiac shadow is enlarged but stable. The lungs are well aerated bilaterally. No focal infiltrate or sizable effusion is seen. No bony abnormality is noted.  IMPRESSION: No acute abnormality seen. Electronically Signed   By: Alcide Clever M.D.   On: 11/17/2019 18:38    ____________________________________________   PROCEDURES  Procedure(s) performed:   Procedures  None  ____________________________________________   INITIAL IMPRESSION / ASSESSMENT AND PLAN / ED COURSE  Pertinent labs & imaging results that were available during my care of the patient were reviewed by me and considered in my medical decision making (see chart for details).   Patient presents to the emergency department on postop day 2 status post lumbar spine fusion L4/L5 with Dr. Otelia Sergeant.  Patient is not in fact having worsening lower back pain.  Patient is having some lower abdominal discomfort.  Will screen for urinary tract infection and postop pneumonia.  Patient is not far enough out to suspect infection at the operative site.  She is also dealing with some constipation.  Plan for CT abdomen pelvis along with screening lab work, UA, chest x-ray, reassess after pain medication. No neuro deficits.   Labs and UA reviewed no acute findings.  Chest x-ray clear without infiltrate.  Patient CT scan read and images independently reviewed.  Radiology does not see abscess but is reading diffuse cellulitis.  On exam this is not consistent with cellulitis.  There is no erythema or warmth.  There is dependent edema in the lower back and buttocks area.  The incision is well-appearing.  Discussed the case, CT scan, labs with Dr. Roda Shutters.  Advised that the patient could add Toradol if tolerated for pain.  Discussed this with the patient and she is like she is doing okay with her medicines at home.  No additional medicines prescribed here.  Discussed taking MiraLAX and Colace for constipation and will call Ortho office in the morning to discuss symptoms and arrange follow-up. Discussed ED return precautions. Husband at bedside assisting with translation at patient request rather than interpreter.    ____________________________________________  FINAL CLINICAL IMPRESSION(S) / ED DIAGNOSES  Final diagnoses:  Lower abdominal pain    MEDICATIONS GIVEN DURING THIS VISIT:  Medications  morphine 4 MG/ML injection 4 mg (4 mg Intravenous Given 11/17/19 1825)  ondansetron (ZOFRAN) injection 4 mg (4 mg Intravenous Given 11/17/19 1825)  iohexol (OMNIPAQUE) 300 MG/ML solution 100 mL (100 mLs Intravenous Contrast Given 11/17/19 1944)    Note:  This document was prepared using Dragon voice recognition software and may include unintentional dictation errors.  Alona Bene, MD, [facility] Emergency Medicine    Piera Downs, Arlyss Repress, MD 11/17/19 2135

## 2019-11-19 LAB — URINE CULTURE: Culture: 70000 — AB

## 2019-11-19 NOTE — Anesthesia Postprocedure Evaluation (Signed)
Anesthesia Post Note  Patient: Dimple Geurin  Procedure(s) Performed: TRANSFORAMINAL LUMBAR INTERBODY FUSION LEFT L4-5 WITH PEDICLE SCREWS, RODS, CAGES, LOCAL BONE GRAFT, ALLOGRAFT BONE GRAFT, VIVIGEN (N/A Spine Lumbar)     Patient location during evaluation: PACU Anesthesia Type: General Level of consciousness: patient cooperative and awake Pain management: pain level controlled Vital Signs Assessment: post-procedure vital signs reviewed and stable Respiratory status: spontaneous breathing, nonlabored ventilation, respiratory function stable and patient connected to nasal cannula oxygen Cardiovascular status: blood pressure returned to baseline and stable Postop Assessment: no apparent nausea or vomiting Anesthetic complications: no    Last Vitals:  Vitals:   11/16/19 0333 11/16/19 0733  BP: (!) 115/52 (!) 127/56  Pulse: 64 60  Resp: 18 16  Temp: 36.9 C 36.9 C  SpO2: 100% 99%    Last Pain:  Vitals:   11/16/19 1430  TempSrc:   PainSc: 6                  Cleophus Mendonsa

## 2019-11-20 NOTE — Progress Notes (Signed)
ED Antimicrobial Stewardship Positive Culture Follow Up   Shannon Williamson is an 59 y.o. female who presented to [facility] on 11/17/2019 with a chief complaint of dizziness, chills and lower abdominal discomfort. Pt is 2 days s/p lumbar spine fusion. UA neg  Chief Complaint  Patient presents with  . Back Pain  . Post-op Problem    Recent Results (from the past 720 hour(s))  Surgical pcr screen     Status: None   Collection Time: 11/11/19  2:16 PM   Specimen: Nasal Mucosa; Nasal Swab  Result Value Ref Range Status   MRSA, PCR NEGATIVE NEGATIVE Final   Staphylococcus aureus NEGATIVE NEGATIVE Final    Comment: (NOTE) The Xpert SA Assay (FDA approved for NASAL specimens in patients 51 years of age and older), is one component of a comprehensive surveillance program. It is not intended to diagnose infection nor to guide or monitor treatment. Performed at [facility], [address], [place], [place] [address]   SARS CORONAVIRUS 2 (TAT 6-24 HRS) Nasopharyngeal Nasopharyngeal Swab     Status: None   Collection Time: 11/11/19  3:21 PM   Specimen: Nasopharyngeal Swab  Result Value Ref Range Status   SARS Coronavirus 2 NEGATIVE NEGATIVE Final    Comment: (NOTE) SARS-CoV-2 target nucleic acids are NOT DETECTED. The SARS-CoV-2 RNA is generally detectable in upper and lower respiratory specimens during the acute phase of infection. Negative results do not preclude SARS-CoV-2 infection, do not rule out co-infections with other pathogens, and should not be used as the sole basis for treatment or other patient management decisions. Negative results must be combined with clinical observations, patient history, and epidemiological information. The expected result is Negative. Fact Sheet for Patients: HairSlick.no Fact Sheet for Healthcare Providers: quierodirigir.com This test is not yet approved or cleared by the [place]  FDA and  has been authorized for detection and/or diagnosis of SARS-CoV-2 by FDA under an Emergency Use Authorization (EUA). This EUA will remain  in effect (meaning this test can be used) for the duration of the COVID-19 declaration under Section 56 4(b)(1) of the Act, 21 U.S.C. section 360bbb-3(b)(1), unless the authorization is terminated or revoked sooner. Performed at [facility], [address], [place], [place] [address]   Urine culture     Status: Abnormal   Collection Time: 11/17/19  7:51 PM   Specimen: Urine, Clean Catch  Result Value Ref Range Status   Specimen Description URINE, CLEAN CATCH  Final   Special Requests   Final    NONE Performed at [facility], [address] [place], [place] [address]    Culture 70,000 COLONIES/mL [facility] MORGANII (A)  Final   Report Status 11/19/2019 FINAL  Final   Organism ID, Bacteria MORGANELLA MORGANII (A)  Final      Susceptibility   Morganella morganii - MIC*    AMPICILLIN >=32 RESISTANT Resistant     CEFAZOLIN >=64 RESISTANT Resistant     CEFTRIAXONE <=1 SENSITIVE Sensitive     CIPROFLOXACIN <=0.25 SENSITIVE Sensitive     GENTAMICIN <=1 SENSITIVE Sensitive     IMIPENEM 1 SENSITIVE Sensitive     NITROFURANTOIN 128 RESISTANT Resistant     TRIMETH/SULFA <=20 SENSITIVE Sensitive     AMPICILLIN/SULBACTAM 16 INTERMEDIATE Intermediate     PIP/TAZO <=4 SENSITIVE Sensitive     * 70,000 COLONIES/mL MORGANELLA MORGANII    Discussed with ED PA-C. Patient placement to call patient for symptom check. If abdominal discomfort has  worsened or patient has any new s/s of UTI, will prescribe bactrim. If symptoms improved, no antibiotics necessary. Patient was advised to f/u with ortho provider after discharge during her ED visit   New antibiotic prescription: Bactrim DS 1 tablet twice daily x 5 days   ED Provider: Arlean Hopping, PA-C    Albertina Parr, PharmD., BCPS, BCCCP Clinical Pharmacist Clinical phone for 11/20/19  until 10pm: [phone] If after 10pm, please refer to [facility] for unit-specific pharmacist

## 2019-11-21 ENCOUNTER — Telehealth: Payer: Self-pay | Admitting: Emergency Medicine

## 2019-11-21 NOTE — Telephone Encounter (Signed)
Post ED Visit - Positive Culture Follow-up  Culture report reviewed by antimicrobial stewardship pharmacist: [facility] []  , Pharm.D. []  Enzo Bi, Pharm.D., BCPS AQ-ID []  , Pharm.D., BCPS []  Celedonio Miyamoto, Pharm.D., BCPS []  [place], Garvin Fila.D., BCPS, AAHIVP []  , Pharm.D., BCPS, AAHIVP []  Georgina Pillion, PharmD, BCPS []  , PharmD, BCPS []  [place], PharmD, BCPS []  [address], PharmD []  , PharmD, BCPS [x]  Estella Husk, PharmD  Pharmacy Team []  Lysle Pearl, PharmD []  , PharmD []  Phillips Climes, PharmD []  , Rph []  Agapito Games) , PharmD []  Verlan Friends, PharmD []  , PharmD []  Mervyn Gay, PharmD []  , PharmD []  Vinnie Level, PharmD []  Wonda Old[employer], PharmD []  , PharmD []  Len Childs, PharmD   Positive urine culture Patient contacted, denies any signs/symptoms of UTI,no further patient follow-up is required at this time.  Shannon Williamson 11/21/2019, 6:21 PM

## 2019-11-30 NOTE — Discharge Summary (Signed)
Patient ID: Shannon Williamson MRN: [id number] DOB/AGE: June 14, 1961 59 y.o.  Admit date: 11/15/2019 Discharge date: 11/16/2019 Admission Diagnoses:  Principal Problem:   Spondylolisthesis, lumbar region Active Problems:   Spinal stenosis, lumbar region with neurogenic claudication   S/P lumbar spinal fusion   Discharge Diagnoses:  Principal Problem:   Spondylolisthesis, lumbar region Active Problems:   Spinal stenosis, lumbar region with neurogenic claudication   S/P lumbar spinal fusion  status post Procedure(s): TRANSFORAMINAL LUMBAR INTERBODY FUSION LEFT L4-5 WITH PEDICLE SCREWS, RODS, CAGES, LOCAL BONE GRAFT, ALLOGRAFT BONE GRAFT, VIVIGEN  Past Medical History:  Diagnosis Date  . Allergy   . Arthritis   . Hypertension     Surgeries: Procedure(s): TRANSFORAMINAL LUMBAR INTERBODY FUSION LEFT L4-5 WITH PEDICLE SCREWS, RODS, CAGES, LOCAL BONE GRAFT, ALLOGRAFT BONE GRAFT, VIVIGEN on 11/15/2019   Consultants:   Discharged Condition: Improved  Hospital Course: Shannon Williamson is an 59 y.o. female who was admitted 11/15/2019 for operative treatment of Spondylolisthesis, lumbar region. Patient failed conservative treatments (please see the history and physical for the specifics) and had severe unremitting pain that affects sleep, daily activities and work/hobbies. After pre-op clearance, the patient was taken to the operating room on 11/15/2019 and underwent  Procedure(s): TRANSFORAMINAL LUMBAR INTERBODY FUSION LEFT L4-5 WITH PEDICLE SCREWS, RODS, CAGES, LOCAL BONE GRAFT, ALLOGRAFT BONE GRAFT, VIVIGEN.    Patient was given perioperative antibiotics:  Anti-infectives (From admission, onward)   Start     Dose/Rate Route Frequency Ordered Stop   11/15/19 1600  ceFAZolin (ANCEF) IVPB 2g/100 mL premix     2 g 200 mL/hr over 30 Minutes Intravenous Every 8 hours 11/15/19 1328 11/16/19 0850   11/15/19 0632  ceFAZolin (ANCEF) 2-4 GM/100ML-% IVPB    Note to Pharmacy: Shannon Williamson    : cabinet override      11/15/19 0632 11/15/19 0756   11/15/19 0630  ceFAZolin (ANCEF) IVPB 2g/100 mL premix     2 g 200 mL/hr over 30 Minutes Intravenous On call to O.R. 11/15/19 3382 11/15/19 0754       Patient was given sequential compression devices and early ambulation to prevent DVT.   Patient benefited maximally from hospital stay and there were no complications. At the time of discharge, the patient was urinating/moving their bowels without difficulty, tolerating a regular diet, pain is controlled with oral pain medications and they have been cleared by PT/OT.   Recent vital signs: No data found.   Recent laboratory studies: No results for input(s): WBC, HGB, HCT, PLT, NA, K, CL, CO2, BUN, CREATININE, GLUCOSE, INR, CALCIUM in the last 72 hours.  Invalid input(s): PT, 2   Discharge Medications:   Allergies as of 11/16/2019   No Known Allergies     Medication List    STOP taking these medications   gabapentin 100 MG capsule Commonly known as: NEURONTIN   traMADol 50 MG tablet Commonly known as: ULTRAM     TAKE these medications   diclofenac 50 MG EC tablet Commonly known as: VOLTAREN TAKE 1 TABLET BY MOUTH THREE TIMES A DAY What changed:   how much to take  when to take this   hydrochlorothiazide 12.5 MG capsule Commonly known as: MICROZIDE Take 12.5 mg by mouth daily.   HYDROcodone-acetaminophen 7.5-325 MG tablet Commonly known as: NORCO Take 1 tablet by mouth every 4 (four) hours as needed for moderate pain ((score 4 to 6)).   lisinopril 20 MG tablet Commonly known as: ZESTRIL Take 20 mg by mouth 2 (two) times  daily.   loratadine 10 MG tablet Commonly known as: CLARITIN Take 10 mg by mouth daily as needed for allergies.   methocarbamol 500 MG tablet Commonly known as: ROBAXIN Take 1 tablet (500 mg total) by mouth every 6 (six) hours as needed for muscle spasms.   metoprolol succinate 100 MG 24 hr tablet Commonly known as: TOPROL-XL Take 100 mg  by mouth daily. Take with or immediately following a meal.   multivitamin with minerals tablet Take 1 tablet by mouth daily. Woman       Diagnostic Studies: DG Chest 2 View  Result Date: 11/12/2019 CLINICAL DATA:  Preoperative EXAM: CHEST - 2 VIEW COMPARISON:  02/25/2018 FINDINGS: Cardiomegaly. Both lungs are clear. The visualized skeletal structures are unremarkable. IMPRESSION: Cardiomegaly without acute abnormality of the lungs. Electronically Signed   By: Lauralyn Primes M.D.   On: 11/12/2019 08:40   DG Lumbar Spine Complete  Result Date: 11/15/2019 CLINICAL DATA:  Spinal stenosis and spondylolisthesis. EXAM: LUMBAR SPINE - COMPLETE 4+ VIEW; DG C-ARM 1-60 MIN COMPARISON:  MRI dated 08/04/2019 FINDINGS: Multiple C-arm images demonstrate the patient has undergone interbody and posterior fusion at L4-5. Grade 1 spondylolisthesis at L4-5, unchanged. IMPRESSION: Satisfactory appearance of the lumbar spine after interbody and posterior fusion at L4-5. FLUOROSCOPY TIME:  1 minutes 16 seconds C-arm fluoroscopic images were obtained intraoperatively and submitted for post operative interpretation. Electronically Signed   By: Francene Boyers M.D.   On: 11/15/2019 11:46   CT ABDOMEN PELVIS W CONTRAST  Result Date: 11/17/2019 CLINICAL DATA:  Severe back pain after having back procedure done yesterday. EXAM: CT ABDOMEN AND PELVIS WITH CONTRAST TECHNIQUE: Multidetector CT imaging of the abdomen and pelvis was performed using the standard protocol following bolus administration of intravenous contrast. CONTRAST:  OMNIPAQUE IOHEXOL 300 MG/ML  SOLN COMPARISON:  None. FINDINGS: Lower chest: No acute abnormality. Hepatobiliary: No focal liver abnormality is seen. No gallstones, gallbladder wall thickening, or biliary dilatation. Pancreas: Unremarkable. No pancreatic ductal dilatation or surrounding inflammatory changes. Spleen: Normal in size without focal abnormality. Adrenals/Urinary Tract: Adrenal glands are  unremarkable. Kidneys are normal, without renal calculi, focal lesion, or hydronephrosis. Bladder is unremarkable. Stomach/Bowel: Stomach is within normal limits. Appendix appears normal. No evidence of bowel wall thickening, distention, or inflammatory changes. Vascular/Lymphatic: No significant vascular findings are present. No enlarged abdominal or pelvic lymph nodes. Reproductive: A 1.9 cm diameter round well-defined hyperdense focus is seen within the anterior lateral aspect of the uterus on the right (axial CT image 56, CT series number 3). Other: No abdominal wall hernia or abnormality. No abdominopelvic ascites. Musculoskeletal: Moderate to marked severity diffuse subcutaneous inflammatory fat stranding is seen along the posterior aspect of the lumbar spine. This extends from the approximate level of the T10 vertebral body to the level of L4. Several tiny foci of associated soft tissue air are seen. There is no evidence of associated fluid collection, abscess or hematoma. Multiple midline posterior skin staples are seen. Bilateral metallic density pedicle screws are seen at the levels of L4 and L5 vertebral bodies. There is associated streak artifact and subsequently limited evaluation of the adjacent osseous and soft tissue structures. IMPRESSION: 1. Moderate to marked severity diffuse cellulitis and postoperative changes, seen involving the soft tissues along the posterior aspect of the lumbar spine. 2. No evidence of associated fluid collection, abscess or hematoma. 3. Postoperative changes within the lower lumbar spine. 4. Round hyperdense focus within the lateral aspect of the uterus. While this may represent a  uterine fibroid, aneurysmal dilatation of an arterial structure cannot be excluded. Correlation with pelvic ultrasound is recommended. Electronically Signed   By: Aram Candela M.D.   On: 11/17/2019 20:10   DG Chest Portable 1 View  Result Date: 11/17/2019 CLINICAL DATA:  Cough EXAM:  PORTABLE CHEST 1 VIEW COMPARISON:  11/11/2019 FINDINGS: Cardiac shadow is enlarged but stable. The lungs are well aerated bilaterally. No focal infiltrate or sizable effusion is seen. No bony abnormality is noted. IMPRESSION: No acute abnormality seen. Electronically Signed   By: Alcide Clever M.D.   On: 11/17/2019 18:38   DG C-Arm 1-60 Min  Result Date: 11/15/2019 CLINICAL DATA:  Spinal stenosis and spondylolisthesis. EXAM: LUMBAR SPINE - COMPLETE 4+ VIEW; DG C-ARM 1-60 MIN COMPARISON:  MRI dated 08/04/2019 FINDINGS: Multiple C-arm images demonstrate the patient has undergone interbody and posterior fusion at L4-5. Grade 1 spondylolisthesis at L4-5, unchanged. IMPRESSION: Satisfactory appearance of the lumbar spine after interbody and posterior fusion at L4-5. FLUOROSCOPY TIME:  1 minutes 16 seconds C-arm fluoroscopic images were obtained intraoperatively and submitted for post operative interpretation. Electronically Signed   By: Francene Boyers M.D.   On: 11/15/2019 11:46    Discharge Instructions    Call MD / Call 911   Complete by: As directed    If you experience chest pain or shortness of breath, CALL 911 and be transported to the hospital emergency room.  If you develope a fever above 101 F, pus (white drainage) or increased drainage or redness at the wound, or calf pain, call your surgeon's office.   Constipation Prevention   Complete by: As directed    Drink plenty of fluids.  Prune juice may be helpful.  You may use a stool softener, such as Colace (over the counter) 100 mg twice a day.  Use MiraLax (over the counter) for constipation as needed.   Diet - low sodium heart healthy   Complete by: As directed    Discharge instructions   Complete by: As directed    Call if there is increasing drainage, fever greater than 101.5, severe head aches, and worsening nausea or light sensitivity. If shortness of breath, bloody cough or chest tightness or pain go to an emergency room. No lifting  greater than 10 lbs. Avoid bending, stooping and twisting. Use brace when sitting and out of bed even to go to bathroom. Walk in house for first 2 weeks then may start to get out slowly increasing distances up to one mile by 4-6 weeks post op. After 5 days may shower and change dressing following bathing with shower.When bathing remove the brace shower and replace brace before getting out of the shower. If drainage, keep dry dressing and do not bathe the incision, use an moisture impervious dressing. Please call and return for scheduled follow up appointment 2 weeks from the time of surgery.   Driving restrictions   Complete by: As directed    No driving for 8 weeks   Increase activity slowly as tolerated   Complete by: As directed    Lifting restrictions   Complete by: As directed    No lifting for 12 weeks      Follow-up Information    Kerrin Champagne, MD In 2 weeks.   Specialty: Orthopedic Surgery Why: For wound re-check Contact information: [address] [place] [address] [phone]           Discharge Plan:  discharge to home  Disposition:     Signed: Fayrene Fearing  Ricard Williamson  11/30/2019, 3:46 PM

## 2019-12-15 ENCOUNTER — Encounter: Payer: Self-pay | Admitting: Specialist

## 2019-12-15 ENCOUNTER — Ambulatory Visit (INDEPENDENT_AMBULATORY_CARE_PROVIDER_SITE_OTHER): Payer: BC Managed Care – PPO

## 2019-12-15 ENCOUNTER — Ambulatory Visit (INDEPENDENT_AMBULATORY_CARE_PROVIDER_SITE_OTHER): Payer: BC Managed Care – PPO | Admitting: Specialist

## 2019-12-15 ENCOUNTER — Other Ambulatory Visit: Payer: Self-pay

## 2019-12-15 VITALS — BP 162/87 | HR 71 | Ht 63.0 in | Wt 165.0 lb

## 2019-12-15 DIAGNOSIS — Z981 Arthrodesis status: Secondary | ICD-10-CM | POA: Diagnosis not present

## 2019-12-15 MED ORDER — METHOCARBAMOL 500 MG PO TABS: 500.0000 mg | ORAL_TABLET | Freq: Four times a day (QID) | ORAL | 1 refills | Status: DC | PRN

## 2019-12-15 MED ORDER — HYDROCODONE-ACETAMINOPHEN 7.5-325 MG PO TABS: 1.0000 | ORAL_TABLET | ORAL | 0 refills | Status: DC | PRN

## 2019-12-15 NOTE — Progress Notes (Signed)
   Post-Op Visit Note   Patient: Zienna Ahlin           Date of Birth: [birth year]           MRN: [id number] Visit Date: 12/15/2019 PCP: Fleet Contras, MD   Assessment & Plan:4 weeks post L4-5 fusion for spondylolisthesis  Chief Complaint:  Chief Complaint  Patient presents with  . Lower Back - Routine Post Op   Visit Diagnoses:  1. S/P lumbar fusion   Incision is healed, no swelling or drainage, or erythrema Leg strength is normal. SLR is negative.  Radiographs   Plan:Avoid frequent bending and stooping  No lifting greater than 10 lbs. May use ice or moist heat for pain. Weight loss is of benefit. May shower and bath the area of the incision. No bandage necessary after 1-2 hours.  Exercise is important to improve your indurance and does allow people to function better inspite of back pain.    Follow-Up Instructions: Return in about 6 weeks (around 01/26/2020).   Orders:  Orders Placed This Encounter  Procedures  . XR Lumbar Spine 2-3 Views   Meds ordered this encounter  Medications  . HYDROcodone-acetaminophen (NORCO) 7.5-325 MG tablet    Sig: Take 1 tablet by mouth every 4 (four) hours as needed for moderate pain ((score 4 to 6)).    Dispense:  30 tablet    Refill:  0  . methocarbamol (ROBAXIN) 500 MG tablet    Sig: Take 1 tablet (500 mg total) by mouth every 6 (six) hours as needed for muscle spasms.    Dispense:  40 tablet    Refill:  1    Imaging: No results found.  PMFS History: Patient Active Problem List   Diagnosis Date Noted  . Spondylolisthesis, lumbar region 11/15/2019    Priority: High    Class: Chronic  . Spinal stenosis, lumbar region with neurogenic claudication 11/15/2019    Priority: High    Class: Chronic  . S/P lumbar spinal fusion 11/15/2019  . Abdominal pain 02/23/2018  . Fever 02/23/2018  . Generalized body aches 02/23/2018  . Viral illness 02/23/2018  . Acute bronchitis 02/26/2017   Past Medical History:  Diagnosis  Date  . Allergy   . Arthritis   . Hypertension     History reviewed. No pertinent family history.  Past Surgical History:  Procedure Laterality Date  . CARPAL TUNNEL RELEASE Right 06/21/2013   Procedure: RIGHT CARPAL TUNNEL RELEASE;  Surgeon: Nicki Reaper, MD;  Location: [facility];  Service: Orthopedics;  Laterality: Right;  . NO PAST SURGERIES     Social History   Occupational History  . Not on file  Tobacco Use  . Smoking status: Never Smoker  . Smokeless tobacco: Never Used  Substance and Sexual Activity  . Alcohol use: No  . Drug use: No  . Sexual activity: Yes

## 2019-12-15 NOTE — Patient Instructions (Signed)
Plan:Avoid frequent bending and stooping  No lifting greater than 10 lbs. May use ice or moist heat for pain. Weight loss is of benefit. May shower and bath the area of the incision. No bandage necessary after 1-2 hours.

## 2020-01-26 ENCOUNTER — Ambulatory Visit (INDEPENDENT_AMBULATORY_CARE_PROVIDER_SITE_OTHER): Payer: BC Managed Care – PPO | Admitting: Specialist

## 2020-01-26 ENCOUNTER — Other Ambulatory Visit: Payer: Self-pay

## 2020-01-26 ENCOUNTER — Encounter: Payer: Self-pay | Admitting: Specialist

## 2020-01-26 ENCOUNTER — Ambulatory Visit (INDEPENDENT_AMBULATORY_CARE_PROVIDER_SITE_OTHER): Payer: BC Managed Care – PPO

## 2020-01-26 VITALS — BP 157/86 | HR 65 | Ht 63.0 in | Wt 165.0 lb

## 2020-01-26 DIAGNOSIS — Z981 Arthrodesis status: Secondary | ICD-10-CM | POA: Diagnosis not present

## 2020-01-26 NOTE — Patient Instructions (Signed)
Plan: Avoid frequent bending and stooping  No lifting greater than 10 lbs. May use ice or moist heat for pain. Weight loss is of benefit. Best medication for lumbar disc disease is arthritis medications like motrin, celebrex and naprosyn. Exercise is important to improve your indurance and does allow people to function better inspite of back pain.  May use brace 1/2 days for 2 weeks then discontinue. Physical therapy to improve leg strength and walking tolerance

## 2020-01-26 NOTE — Progress Notes (Signed)
   Post-Op Visit Note   Patient: Shannon Williamson           Date of Birth: 03-24-1961           MRN: [id number] Visit Date: 01/26/2020 PCP: Fleet Contras, MD   Assessment & Plan:  Chief Complaint:  Chief Complaint  Patient presents with  . Lower Back - Routine Post Op   Visit Diagnoses:  1. S/P lumbar fusion   Incision is healed. Legs are N-V normal  Some occasional leg numbness bilateral that comes and goes.  No bowel or bladder. Walking is slowly improving.   Plan: Avoid frequent bending and stooping  No lifting greater than 10 lbs. May use ice or moist heat for pain. Weight loss is of benefit. Best medication for lumbar disc disease is arthritis medications like motrin, celebrex and naprosyn. Exercise is important to improve your indurance and does allow people to function better inspite of back pain.  May use brace 1/2 days for 2 weeks then discontinue. Physical therapy to improve leg strength and walking tolerance.   Follow-Up Instructions: No follow-ups on file.   Orders:  Orders Placed This Encounter  Procedures  . XR Lumbar Spine 2-3 Views   No orders of the defined types were placed in this encounter.   Imaging: No results found.  PMFS History: Patient Active Problem List   Diagnosis Date Noted  . Spondylolisthesis, lumbar region 11/15/2019    Priority: High    Class: Chronic  . Spinal stenosis, lumbar region with neurogenic claudication 11/15/2019    Priority: High    Class: Chronic  . S/P lumbar spinal fusion 11/15/2019  . Abdominal pain 02/23/2018  . Fever 02/23/2018  . Generalized body aches 02/23/2018  . Viral illness 02/23/2018  . Acute bronchitis 02/26/2017   Past Medical History:  Diagnosis Date  . Allergy   . Arthritis   . Hypertension     History reviewed. No pertinent family history.  Past Surgical History:  Procedure Laterality Date  . CARPAL TUNNEL RELEASE Right 06/21/2013   Procedure: RIGHT CARPAL TUNNEL RELEASE;   Surgeon: Nicki Reaper, MD;  Location: [facility];  Service: Orthopedics;  Laterality: Right;  . NO PAST SURGERIES     Social History   Occupational History  . Not on file  Tobacco Use  . Smoking status: Never Smoker  . Smokeless tobacco: Never Used  Vaping Use  . Vaping Use: Never used  Substance and Sexual Activity  . Alcohol use: No  . Drug use: No  . Sexual activity: Yes

## 2020-02-02 ENCOUNTER — Other Ambulatory Visit: Payer: Self-pay

## 2020-02-02 ENCOUNTER — Ambulatory Visit (INDEPENDENT_AMBULATORY_CARE_PROVIDER_SITE_OTHER): Payer: BC Managed Care – PPO | Admitting: Physical Therapy

## 2020-02-02 ENCOUNTER — Encounter: Payer: Self-pay | Admitting: Physical Therapy

## 2020-02-02 DIAGNOSIS — M6281 Muscle weakness (generalized): Secondary | ICD-10-CM | POA: Diagnosis not present

## 2020-02-02 DIAGNOSIS — M545 Low back pain, unspecified: Secondary | ICD-10-CM

## 2020-02-02 DIAGNOSIS — R2689 Other abnormalities of gait and mobility: Secondary | ICD-10-CM

## 2020-02-02 NOTE — Patient Instructions (Signed)
Access Code: [email or web address] URL: [email or web address] Date: 02/02/2020 Prepared by: Ivery Quale  Exercises Supine Hamstring Stretch with Strap - 2 x daily - 6 x weekly - 2 reps - 30 hold Hooklying Single Knee to Chest Stretch - 2 x daily - 6 x weekly - 3 reps - 20 hold Supine Posterior Pelvic Tilt - 2 x daily - 6 x weekly - 10 reps - 2 sets - 5 hold Supine Lower Trunk Rotation - 2 x daily - 6 x weekly - 1 sets - 10 reps - 5 hold Standing Sidebends - 2 x daily - 6 x weekly - 10 reps - 1 sets

## 2020-02-02 NOTE — Therapy (Signed)
[facility] Physical Therapy [address] [place], [place], [address] Phone: [phone]   Fax:  [phone]  Physical Therapy Evaluation  Patient Details  Name: Shannon Williamson MRN: [id number] Date of Birth: 10/29/1960 Referring Provider (PT): Kerrin Champagne, MD   Encounter Date: 02/02/2020   PT End of Session - 02/02/20 1543    Visit Number 1    Number of Visits 16    Date for PT Re-Evaluation 03/29/20    Authorization Type BCBS    PT Start Time 1440    PT Stop Time 1520    PT Time Calculation (min) 40 min    Equipment Utilized During Treatment Back brace   arrived in back brace, removed it for stretching then donned after   Activity Tolerance Patient tolerated treatment well    Behavior During Therapy [facility] for tasks assessed/performed           Past Medical History:  Diagnosis Date  . Allergy   . Arthritis   . Hypertension     Past Surgical History:  Procedure Laterality Date  . CARPAL TUNNEL RELEASE Right 06/21/2013   Procedure: RIGHT CARPAL TUNNEL RELEASE;  Surgeon: Nicki Reaper, MD;  Location: [facility];  Service: Orthopedics;  Laterality: Right;  . NO PAST SURGERIES      There were no vitals filed for this visit.    Subjective Assessment - 02/02/20 1519    Subjective Her husband provides interpretation for english: Relays she had lumbar fusion in april 2020 and has been in back brace ever since and not done any movement or exercise.    Pertinent History S/P lumbar fusion L4-5 on 11/15/19    Limitations Sitting;Lifting;Standing;Walking;House hold activities    Patient Stated Goals reduce pain and get back to normal    Currently in Pain? Yes    Pain Score 5     Pain Location Back    Pain Orientation Lower    Pain Descriptors / Indicators Aching    Pain Type Surgical pain    Pain Radiating Towards down both legs with N/T    Pain Onset More than a month ago    Pain Frequency Intermittent    Aggravating Factors  any  movement or activity    Pain Relieving Factors rest    Multiple Pain Sites No              [facility] PT Assessment - 02/02/20 0001      Assessment   Medical Diagnosis S/P lumbar fusion L4-5 on 11/15/19    Referring Provider (PT) Kerrin Champagne, MD    Next MD Visit 02/28/20    Prior Therapy none      Precautions   Precautions Back    Precaution Comments Weaning from brace, wearing 1/2 days for 2 weeks then discontinue.      Balance Screen   Has the patient fallen in the past 6 months No    Has the patient had a decrease in activity level because of a fear of falling?  No    Is the patient reluctant to leave their home because of a fear of falling?  No      Home Environment   Living Environment Private residence    Living Arrangements Spouse/significant other      Prior Function   Level of Independence Independent      Cognition   Overall Cognitive Status Within Functional Limits for tasks assessed      Observation/Other Assessments  Observations incision site well healed with no signs of infection      Sensation   Light Touch Appears Intact      Coordination   Gross Motor Movements are Fluid and Coordinated Yes      Posture/Postural Control   Posture Comments rigid, guarded posture      ROM / Strength   AROM / PROM / Strength AROM;Strength      AROM   AROM Assessment Site Lumbar    Lumbar Flexion 10    Lumbar Extension 10    Lumbar - Right Side Bend 10    Lumbar - Left Side Bend 10    Lumbar - Right Rotation 50%    Lumbar - Left Rotation 50%      Strength   Overall Strength Comments bilat leg strength 4/5 MMT tested in sitting      Flexibility   Soft Tissue Assessment /Muscle Length --   tight lumbar extensors, hip rotators, and hamstrings bilat     Palpation   Palpation comment denies TTP to lumbar      Special Tests   Other special tests mild pain with SLR but no increased radiculopathy      Transfers   Comments needs mod A with supine to sit and  sit to supine to maintain neutral spine and due to pain. She is independent with sit to stands      Ambulation/Gait   Gait Comments slower velocity but can ambulate short community distance no AD                       Objective measurements completed on examination: See above findings.       [facility] Adult PT Treatment/Exercise - 02/02/20 0001      Exercises   Exercises Lumbar      Lumbar Exercises: Stretches   Active Hamstring Stretch Right;Left;2 reps;30 seconds    Active Hamstring Stretch Limitations supine with strap    Single Knee to Chest Stretch Right;Left;2 reps;30 seconds    Lower Trunk Rotation 5 reps    Lower Trunk Rotation Limitations 5 sec holds    Pelvic Tilt 15 reps    Pelvic Tilt Limitations cues and manual facilitation for technique    Other Lumbar Stretch Exercise standing lumbar sidebends X 10 bilat      Lumbar Exercises: Aerobic   Nustep try next visit      Modalities   Modalities Moist Heat      Moist Heat Therapy   Number Minutes Moist Heat 10 Minutes    Moist Heat Location Lumbar Spine                  PT Education - 02/02/20 1539    Education Details HEP, POC, can use heat or ice gradual ROM progression and MD instructions to wean out of brace 1/2 days for 2 weeks then DC it    Person(s) Educated Patient;Spouse    Methods Explanation;Demonstration;Verbal cues;Handout    Comprehension Verbalized understanding;Returned demonstration;Need further instruction            PT Short Term Goals - 02/02/20 1559      PT SHORT TERM GOAL #1   Title Pt will improve lumbar ROM by 10 deg each plane to at least 20 deg    Baseline 10 degreees each    Time 4    Period Weeks    Status New    Target Date 03/01/20  PT SHORT TERM GOAL #2   Title Pt will be I and compliant with HEP.    Time 4    Period Weeks    Status New             PT Long Term Goals - 02/02/20 1600      PT LONG TERM GOAL #1   Title Pt will improve lumbar  [facility]    Time 8    Period Weeks    Status New    Target Date 03/29/20      PT LONG TERM GOAL #2   Title Pt will improve bilat leg strenght to 5/5 MMT tested in sitting to improve function    Time 8    Period Weeks    Status New      PT LONG TERM GOAL #3   Title Pt will report overall no more than 3/10 low back pain with usual ADL's and communtiy ambulation    Time 8    Period Weeks    Status New      PT LONG TERM GOAL #4   Title Pt will no longer report radicular symptoms into her legs.    Time 8    Period Weeks    Status New                  Plan - 02/02/20 1545    Clinical Impression Statement Pt presents with lumbar and bilat leg pain, stiffness and weakness S/P Lumbar fusion L4-5 on 11/15/19. MD recommending lower extremity strengthening, hamstring stretching, core strengthening, aerobic exercises. MD has instructions for Weaning from brace, wearing 1/2 days for 2 weeks then discontinue.    Examination-Activity Limitations Lift;Stand;Stairs;Dressing;Squat;Sleep;Carry;Bend;Bathing;Bed Mobility;Locomotion Level;Transfers    Examination-Participation Restrictions Cleaning;Community Activity;Driving;Laundry;Shop    Stability/Clinical Decision Making Stable/Uncomplicated    Clinical Decision Making Low    Rehab Potential Good    PT Frequency 2x / week    PT Duration 8 weeks    PT Treatment/Interventions ADLs/Self Care Home Management;Cryotherapy;Electrical Stimulation;Aquatic Therapy;Iontophoresis 4mg /ml Dexamethasone;Moist Heat;Ultrasound;Gait training;Stair training;Therapeutic activities;Therapeutic exercise;Balance training;Neuromuscular re-education;Manual techniques;Passive range of motion;Dry needling;Joint Manipulations;Taping    PT Next Visit Plan review and update HEP PRN, wean from brace 1/2 days until 02/09/20 per MD from date of referral    PT Home Exercise Plan Access Code: [email or web address]    Consulted and Agree with Plan of Care Patient;Family member/caregiver     Family Member Consulted Husband           Patient will benefit from skilled therapeutic intervention in order to improve the following deficits and impairments:  Decreased activity tolerance, Decreased mobility, Decreased endurance, Decreased range of motion, Decreased strength, Hypomobility, Difficulty walking, Increased fascial restricitons, Increased muscle spasms, Impaired flexibility, Postural dysfunction, Improper body mechanics, Pain  Visit Diagnosis: Acute bilateral low back pain, unspecified whether sciatica present  Muscle weakness (generalized)  Other abnormalities of gait and mobility     Problem List Patient Active Problem List   Diagnosis Date Noted  . Spondylolisthesis, lumbar region 11/15/2019    Class: Chronic  . Spinal stenosis, lumbar region with neurogenic claudication 11/15/2019    Class: Chronic  . S/P lumbar spinal fusion 11/15/2019  . Abdominal pain 02/23/2018  . Fever 02/23/2018  . Generalized body aches 02/23/2018  . Viral illness 02/23/2018  . Acute bronchitis 02/26/2017    Birdie RiddleBrian R Momoko Slezak,PT,DPT 02/02/2020, 4:50 PM  [facility] [address] [place], [place], [address] Phone: [phone]   Fax:  [phone]  Name: Denajah Farias MRN: [id number] Date of Birth: February 27, 1961

## 2020-02-08 DIAGNOSIS — I10 Essential (primary) hypertension: Secondary | ICD-10-CM | POA: Diagnosis not present

## 2020-02-08 DIAGNOSIS — M48062 Spinal stenosis, lumbar region with neurogenic claudication: Secondary | ICD-10-CM | POA: Diagnosis not present

## 2020-02-08 DIAGNOSIS — M19011 Primary osteoarthritis, right shoulder: Secondary | ICD-10-CM | POA: Diagnosis not present

## 2020-02-08 DIAGNOSIS — E7849 Other hyperlipidemia: Secondary | ICD-10-CM | POA: Diagnosis not present

## 2020-02-17 ENCOUNTER — Encounter: Payer: Self-pay | Admitting: Physical Therapy

## 2020-02-17 ENCOUNTER — Ambulatory Visit (INDEPENDENT_AMBULATORY_CARE_PROVIDER_SITE_OTHER): Payer: BC Managed Care – PPO | Admitting: Physical Therapy

## 2020-02-17 ENCOUNTER — Other Ambulatory Visit: Payer: Self-pay

## 2020-02-17 DIAGNOSIS — M545 Low back pain, unspecified: Secondary | ICD-10-CM

## 2020-02-17 DIAGNOSIS — M6281 Muscle weakness (generalized): Secondary | ICD-10-CM

## 2020-02-17 DIAGNOSIS — R2689 Other abnormalities of gait and mobility: Secondary | ICD-10-CM

## 2020-02-17 NOTE — Therapy (Signed)
[facility] Physical Therapy [address] [place], [place], [address] Phone: [phone]   Fax:  [phone]  Physical Therapy Treatment  Patient Details  Name: Shannon Williamson MRN: [id number] Date of Birth: 16-Jul-1961 Referring Provider (PT): Kerrin Champagne, MD   Encounter Date: 02/17/2020   PT End of Session - 02/17/20 1632    Visit Number 2    Number of Visits 16    Date for PT Re-Evaluation 03/29/20    Authorization Type BCBS    PT Start Time 1553    PT Stop Time 1631    PT Time Calculation (min) 38 min    Equipment Utilized During Treatment --   arrived in back brace, removed it for stretching then donned after   Activity Tolerance Patient tolerated treatment well    Behavior During Therapy [facility] for tasks assessed/performed           Past Medical History:  Diagnosis Date  . Allergy   . Arthritis   . Hypertension     Past Surgical History:  Procedure Laterality Date  . CARPAL TUNNEL RELEASE Right 06/21/2013   Procedure: RIGHT CARPAL TUNNEL RELEASE;  Surgeon: Nicki Reaper, MD;  Location: [facility];  Service: Orthopedics;  Laterality: Right;  . NO PAST SURGERIES      There were no vitals filed for this visit.   Subjective Assessment - 02/17/20 1603    Subjective Her husband provides interpretation: Relays she has now weaned out of back brace and is not having much pain unless she moves the wrong way    Patient is accompained by: Family member   Husband interprets session   Pertinent History S/P lumbar fusion L4-5 on 11/15/19    Limitations Sitting;Lifting;Standing;Walking;House hold activities    Patient Stated Goals reduce pain and get back to normal    Pain Onset More than a month ago                             [facility] Adult PT Treatment/Exercise - 02/17/20 0001      Lumbar Exercises: Stretches   Active Hamstring Stretch Right;Left;2 reps;30 seconds    Active Hamstring Stretch Limitations seated     Other Lumbar Stretch Exercise standing lumbar sidebends holding 2 lbs, X 10 bilat, then standing lumbar extensions X 15 reps    Other Lumbar Stretch Exercise seated lumbar flexion pball roll outs 10 sec X 10      Lumbar Exercises: Aerobic   Nustep 8 min L4 UE/LE      Lumbar Exercises: Standing   Lifting Limitations bigginer deadlift 5 lbs from 8 inch step, X 10 reps    Row 20 reps    Theraband Level (Row) Level 2 (Red)    Shoulder Extension 20 reps    Theraband Level (Shoulder Extension) Level 2 (Red)      Lumbar Exercises: Seated   Other Seated Lumbar Exercises in chair, off of back support, diagonal chop lifts with 2 lb ball X 15 bilat                    PT Short Term Goals - 02/02/20 1559      PT SHORT TERM GOAL #1   Title Pt will improve lumbar ROM by 10 deg each plane to at least 20 deg    Baseline 10 degreees each    Time 4    Period Weeks    Status New  Target Date 03/01/20      PT SHORT TERM GOAL #2   Title Pt will be I and compliant with HEP.    Time 4    Period Weeks    Status New             PT Long Term Goals - 02/02/20 1600      PT LONG TERM GOAL #1   Title Pt will improve lumbar ROM to [facility]    Time 8    Period Weeks    Status New    Target Date 03/29/20      PT LONG TERM GOAL #2   Title Pt will improve bilat leg strenght to 5/5 MMT tested in sitting to improve function    Time 8    Period Weeks    Status New      PT LONG TERM GOAL #3   Title Pt will report overall no more than 3/10 low back pain with usual ADL's and communtiy ambulation    Time 8    Period Weeks    Status New      PT LONG TERM GOAL #4   Title Pt will no longer report radicular symptoms into her legs.    Time 8    Period Weeks    Status New                 Plan - 02/17/20 1633    Clinical Impression Statement She has now weaned out of back brace and has made great improvements in her lumbar ROM. She has been compliant with HEP. Began gentle  strengthening today with good tolerance and without complaints. Continue POC    Examination-Activity Limitations Lift;Stand;Stairs;Dressing;Squat;Sleep;Carry;Bend;Bathing;Bed Mobility;Locomotion Level;Transfers    Examination-Participation Restrictions Cleaning;Community Activity;Driving;Laundry;Shop    Stability/Clinical Decision Making Stable/Uncomplicated    Rehab Potential Good    PT Frequency 2x / week    PT Duration 8 weeks    PT Treatment/Interventions ADLs/Self Care Home Management;Cryotherapy;Electrical Stimulation;Aquatic Therapy;Iontophoresis 4mg /ml Dexamethasone;Moist Heat;Ultrasound;Gait training;Stair training;Therapeutic activities;Therapeutic exercise;Balance training;Neuromuscular re-education;Manual techniques;Passive range of motion;Dry needling;Joint Manipulations;Taping    PT Next Visit Plan lumbar ROM, lumbar/core/LE strength    PT Home Exercise Plan Access Code: [email or web address]    Consulted and Agree with Plan of Care Patient;Family member/caregiver    Family Member Consulted Husband           Patient will benefit from skilled therapeutic intervention in order to improve the following deficits and impairments:  Decreased activity tolerance, Decreased mobility, Decreased endurance, Decreased range of motion, Decreased strength, Hypomobility, Difficulty walking, Increased fascial restricitons, Increased muscle spasms, Impaired flexibility, Postural dysfunction, Improper body mechanics, Pain  Visit Diagnosis: Acute bilateral low back pain, unspecified whether sciatica present  Muscle weakness (generalized)  Other abnormalities of gait and mobility     Problem List Patient Active Problem List   Diagnosis Date Noted  . Spondylolisthesis, lumbar region 11/15/2019    Class: Chronic  . Spinal stenosis, lumbar region with neurogenic claudication 11/15/2019    Class: Chronic  . S/P lumbar spinal fusion 11/15/2019  . Abdominal pain 02/23/2018  . Fever 02/23/2018  .  Generalized body aches 02/23/2018  . Viral illness 02/23/2018  . Acute bronchitis 02/26/2017    04/28/2017 02/17/2020, 4:35 PM  [facility] Physical Therapy [address] [place], [place], [place] Phone: [phone]   Fax:  [phone]  Name: Shannon Williamson MRN: Glenetta Borg Date of Birth: [birth year]

## 2020-02-18 ENCOUNTER — Ambulatory Visit (INDEPENDENT_AMBULATORY_CARE_PROVIDER_SITE_OTHER): Payer: BC Managed Care – PPO | Admitting: Physical Therapy

## 2020-02-18 DIAGNOSIS — R2689 Other abnormalities of gait and mobility: Secondary | ICD-10-CM

## 2020-02-18 DIAGNOSIS — M545 Low back pain, unspecified: Secondary | ICD-10-CM

## 2020-02-18 DIAGNOSIS — M6281 Muscle weakness (generalized): Secondary | ICD-10-CM | POA: Diagnosis not present

## 2020-02-18 NOTE — Therapy (Signed)
[facility] Physical Therapy [address] [place], [place], [address] Phone: [phone]   Fax:  [phone]  Physical Therapy Treatment  Patient Details  Name: Shannon Williamson MRN: [id number] Date of Birth: 04-22-1961 Referring Provider (PT): Kerrin Champagne, MD   Encounter Date: 02/18/2020   PT End of Session - 02/18/20 1629    Visit Number 3    Number of Visits 16    Date for PT Re-Evaluation 03/29/20    Authorization Type BCBS    PT Start Time 1510    PT Stop Time 1550    PT Time Calculation (min) 40 min    Equipment Utilized During Treatment --   arrived in back brace, removed it for stretching then donned after   Activity Tolerance Patient tolerated treatment well    Behavior During Therapy [facility] for tasks assessed/performed           Past Medical History:  Diagnosis Date  . Allergy   . Arthritis   . Hypertension     Past Surgical History:  Procedure Laterality Date  . CARPAL TUNNEL RELEASE Right 06/21/2013   Procedure: RIGHT CARPAL TUNNEL RELEASE;  Surgeon: Nicki Reaper, MD;  Location: [facility];  Service: Orthopedics;  Laterality: Right;  . NO PAST SURGERIES      There were no vitals filed for this visit.   Subjective Assessment - 02/18/20 1624    Subjective Her husband provides interpretation: Relays a mild amount of pain and sorenes today in her back. She does not provide number when asked    Patient is accompained by: Family member   Husband interprets session   Pertinent History S/P lumbar fusion L4-5 on 11/15/19    Limitations Sitting;Lifting;Standing;Walking;House hold activities    Patient Stated Goals reduce pain and get back to normal    Pain Onset More than a month ago                  [facility] Adult PT Treatment/Exercise - 02/18/20 0001      Lumbar Exercises: Stretches   Active Hamstring Stretch Right;Left;2 reps;30 seconds    Active Hamstring Stretch Limitations seated    Other Lumbar Stretch  Exercise standing lumbar sidebends holding 2 lbs, X 10 bilat, then standing lumbar extensions X 15 reps    Other Lumbar Stretch Exercise seated lumbar flexion pball roll outs 10 sec X 10      Lumbar Exercises: Aerobic   Nustep 10 min L4 UE/LE      Lumbar Exercises: Machines for Strengthening   Leg Press 62 lbs 3X10    Other Lumbar Machine Exercise Row machine and Lat pulldown 20 lbs X 20 reps ea      Lumbar Exercises: Standing   Row 20 reps    Theraband Level (Row) Level 3 (Green)    Shoulder Extension 20 reps    Theraband Level (Shoulder Extension) Level 3 (Green)    Other Standing Lumbar Exercises anti rotation/piloff press green X 20      Lumbar Exercises: Seated   Other Seated Lumbar Exercises in chair, off of back support, diagonal chop lifts with 2 lb ball X 15 bilat                    PT Short Term Goals - 02/02/20 1559      PT SHORT TERM GOAL #1   Title Pt will improve lumbar ROM by 10 deg each plane to at least 20 deg    Baseline  10 degreees each    Time 4    Period Weeks    Status New    Target Date 03/01/20      PT SHORT TERM GOAL #2   Title Pt will be I and compliant with HEP.    Time 4    Period Weeks    Status New             PT Long Term Goals - 02/02/20 1600      PT LONG TERM GOAL #1   Title Pt will improve lumbar ROM to [facility]    Time 8    Period Weeks    Status New    Target Date 03/29/20      PT LONG TERM GOAL #2   Title Pt will improve bilat leg strenght to 5/5 MMT tested in sitting to improve function    Time 8    Period Weeks    Status New      PT LONG TERM GOAL #3   Title Pt will report overall no more than 3/10 low back pain with usual ADL's and communtiy ambulation    Time 8    Period Weeks    Status New      PT LONG TERM GOAL #4   Title Pt will no longer report radicular symptoms into her legs.    Time 8    Period Weeks    Status New                 Plan - 02/18/20 1631    Clinical Impression Statement  Continued with lumbar strength and ROM with addition of more core strength today .she had no complaints and good tolerance with exercises. She denies any radiculoapthy. Continue POC    Examination-Activity Limitations Lift;Stand;Stairs;Dressing;Squat;Sleep;Carry;Bend;Bathing;Bed Mobility;Locomotion Level;Transfers    Examination-Participation Restrictions Cleaning;Community Activity;Driving;Laundry;Shop    Stability/Clinical Decision Making Stable/Uncomplicated    Rehab Potential Good    PT Frequency 2x / week    PT Duration 8 weeks    PT Treatment/Interventions ADLs/Self Care Home Management;Cryotherapy;Electrical Stimulation;Aquatic Therapy;Iontophoresis 4mg /ml Dexamethasone;Moist Heat;Ultrasound;Gait training;Stair training;Therapeutic activities;Therapeutic exercise;Balance training;Neuromuscular re-education;Manual techniques;Passive range of motion;Dry needling;Joint Manipulations;Taping    PT Next Visit Plan lumbar ROM, lumbar/core/LE strength    PT Home Exercise Plan Access Code: [email or web address]    Consulted and Agree with Plan of Care Patient;Family member/caregiver    Family Member Consulted Husband           Patient will benefit from skilled therapeutic intervention in order to improve the following deficits and impairments:  Decreased activity tolerance, Decreased mobility, Decreased endurance, Decreased range of motion, Decreased strength, Hypomobility, Difficulty walking, Increased fascial restricitons, Increased muscle spasms, Impaired flexibility, Postural dysfunction, Improper body mechanics, Pain  Visit Diagnosis: Acute bilateral low back pain, unspecified whether sciatica present  Muscle weakness (generalized)  Other abnormalities of gait and mobility     Problem List Patient Active Problem List   Diagnosis Date Noted  . Spondylolisthesis, lumbar region 11/15/2019    Class: Chronic  . Spinal stenosis, lumbar region with neurogenic claudication 11/15/2019    Class:  Chronic  . S/P lumbar spinal fusion 11/15/2019  . Abdominal pain 02/23/2018  . Fever 02/23/2018  . Generalized body aches 02/23/2018  . Viral illness 02/23/2018  . Acute bronchitis 02/26/2017    04/28/2017, PT,DPT 02/18/2020, 4:32 PM  [facility] Physical Therapy [address] [place], [place], [place] Phone: [phone]   Fax:  [phone]  Name: Sommer Spickard MRN: Glenetta Borg Date  of Birth: [birth year]

## 2020-02-23 ENCOUNTER — Other Ambulatory Visit: Payer: Self-pay

## 2020-02-23 ENCOUNTER — Ambulatory Visit (INDEPENDENT_AMBULATORY_CARE_PROVIDER_SITE_OTHER): Payer: BC Managed Care – PPO | Admitting: Physical Therapy

## 2020-02-23 ENCOUNTER — Encounter: Payer: Self-pay | Admitting: Physical Therapy

## 2020-02-23 DIAGNOSIS — M6281 Muscle weakness (generalized): Secondary | ICD-10-CM | POA: Diagnosis not present

## 2020-02-23 DIAGNOSIS — R2689 Other abnormalities of gait and mobility: Secondary | ICD-10-CM

## 2020-02-23 DIAGNOSIS — M545 Low back pain, unspecified: Secondary | ICD-10-CM

## 2020-02-23 NOTE — Therapy (Signed)
[facility] Physical Therapy [address] [place], [place], [address] Phone: [phone]   Fax:  [phone]  Physical Therapy Treatment  Patient Details  Name: Shannon Williamson MRN: [id number] Date of Birth: 1960/09/12 Referring Provider (PT): Kerrin Champagne, MD   Encounter Date: 02/23/2020   PT End of Session - 02/23/20 1553    Visit Number 4    Number of Visits 16    Date for PT Re-Evaluation 03/29/20    Authorization Type BCBS    PT Start Time 1511    PT Stop Time 1551    PT Time Calculation (min) 40 min    Equipment Utilized During Treatment --   arrived in back brace, removed it for stretching then donned after   Activity Tolerance Patient tolerated treatment well    Behavior During Therapy [facility] for tasks assessed/performed           Past Medical History:  Diagnosis Date  . Allergy   . Arthritis   . Hypertension     Past Surgical History:  Procedure Laterality Date  . CARPAL TUNNEL RELEASE Right 06/21/2013   Procedure: RIGHT CARPAL TUNNEL RELEASE;  Surgeon: Nicki Reaper, MD;  Location: [facility];  Service: Orthopedics;  Laterality: Right;  . NO PAST SURGERIES      There were no vitals filed for this visit.   Subjective Assessment - 02/23/20 1514    Subjective reports pain in back is about the same.  unable to rate pain.    Patient is accompained by: Family member   Husband interprets session   Pertinent History S/P lumbar fusion L4-5 on 11/15/19    Limitations Sitting;Lifting;Standing;Walking;House hold activities    Patient Stated Goals reduce pain and get back to normal    Currently in Pain? Yes   unable to rate   Pain Location Back    Pain Orientation Lower    Pain Descriptors / Indicators Aching    Pain Type Surgical pain    Pain Onset More than a month ago    Pain Frequency Intermittent    Aggravating Factors  any movement or activity    Pain Relieving Factors rest                              [facility] Adult PT Treatment/Exercise - 02/23/20 1515      Lumbar Exercises: Aerobic   Nustep 10 min L5 UE/LE      Lumbar Exercises: Machines for Strengthening   Leg Press 75# 3x10    Other Lumbar Machine Exercise Row machine and Lat pulldown 20 lbs 3x10 reps ea    Other Lumbar Machine Exercise knee extension 10# 3x10; hamstring curls 15# 3x10      Lumbar Exercises: Standing   Heel Raises --   3x10   Functional Squats --   3x10   Functional Squats Limitations TRX                    PT Short Term Goals - 02/02/20 1559      PT SHORT TERM GOAL #1   Title Pt will improve lumbar ROM by 10 deg each plane to at least 20 deg    Baseline 10 degreees each    Time 4    Period Weeks    Status New    Target Date 03/01/20      PT SHORT TERM GOAL #2   Title Pt will be I  and compliant with HEP.    Time 4    Period Weeks    Status New             PT Long Term Goals - 02/02/20 1600      PT LONG TERM GOAL #1   Title Pt will improve lumbar ROM to [facility]    Time 8    Period Weeks    Status New    Target Date 03/29/20      PT LONG TERM GOAL #2   Title Pt will improve bilat leg strenght to 5/5 MMT tested in sitting to improve function    Time 8    Period Weeks    Status New      PT LONG TERM GOAL #3   Title Pt will report overall no more than 3/10 low back pain with usual ADL's and communtiy ambulation    Time 8    Period Weeks    Status New      PT LONG TERM GOAL #4   Title Pt will no longer report radicular symptoms into her legs.    Time 8    Period Weeks    Status New                 Plan - 02/23/20 1553    Clinical Impression Statement Tolerated strengthening session well today without c/o of pain, did report fatigue at end of session.  Will continue to benefit from PT to maximize function.    Examination-Activity Limitations Lift;Stand;Stairs;Dressing;Squat;Sleep;Carry;Bend;Bathing;Bed Mobility;Locomotion  Level;Transfers    Examination-Participation Restrictions Cleaning;Community Activity;Driving;Laundry;Shop    Stability/Clinical Decision Making Stable/Uncomplicated    Rehab Potential Good    PT Frequency 2x / week    PT Duration 8 weeks    PT Treatment/Interventions ADLs/Self Care Home Management;Cryotherapy;Electrical Stimulation;Aquatic Therapy;Iontophoresis 4mg /ml Dexamethasone;Moist Heat;Ultrasound;Gait training;Stair training;Therapeutic activities;Therapeutic exercise;Balance training;Neuromuscular re-education;Manual techniques;Passive range of motion;Dry needling;Joint Manipulations;Taping    PT Next Visit Plan lumbar ROM, lumbar/core/LE strength, check STGs    PT Home Exercise Plan Access Code: [email or web address]    Consulted and Agree with Plan of Care Patient;Family member/caregiver    Family Member Consulted Husband           Patient will benefit from skilled therapeutic intervention in order to improve the following deficits and impairments:  Decreased activity tolerance, Decreased mobility, Decreased endurance, Decreased range of motion, Decreased strength, Hypomobility, Difficulty walking, Increased fascial restricitons, Increased muscle spasms, Impaired flexibility, Postural dysfunction, Improper body mechanics, Pain  Visit Diagnosis: Acute bilateral low back pain, unspecified whether sciatica present  Muscle weakness (generalized)  Other abnormalities of gait and mobility     Problem List Patient Active Problem List   Diagnosis Date Noted  . Spondylolisthesis, lumbar region 11/15/2019    Class: Chronic  . Spinal stenosis, lumbar region with neurogenic claudication 11/15/2019    Class: Chronic  . S/P lumbar spinal fusion 11/15/2019  . Abdominal pain 02/23/2018  . Fever 02/23/2018  . Generalized body aches 02/23/2018  . Viral illness 02/23/2018  . Acute bronchitis 02/26/2017      04/28/2017, PT, DPT 02/23/20 3:54 PM    [facility] Physical  Therapy [address] [place], [place], [place] Phone: [phone]   Fax:  [phone]  Name: Shannon Williamson MRN: Glenetta Borg Date of Birth: 11/17/1960

## 2020-02-28 ENCOUNTER — Ambulatory Visit: Payer: Self-pay

## 2020-02-28 ENCOUNTER — Ambulatory Visit (INDEPENDENT_AMBULATORY_CARE_PROVIDER_SITE_OTHER): Payer: BC Managed Care – PPO | Admitting: Specialist

## 2020-02-28 ENCOUNTER — Encounter: Payer: Self-pay | Admitting: Specialist

## 2020-02-28 VITALS — BP 156/65 | HR 61 | Ht 63.0 in | Wt 165.0 lb

## 2020-02-28 DIAGNOSIS — Z981 Arthrodesis status: Secondary | ICD-10-CM

## 2020-02-28 NOTE — Patient Instructions (Signed)
Plan: Avoid frequent bending and stooping  No lifting greater than 15-20 lbs. May use ice or moist heat for pain. Weight loss is of benefit. Best medication for lumbar disc disease is arthritis medications like motrin, celebrex and naprosyn. Exercise is important to improve your indurance and does allow people to function better inspite of back pain. Walking is your best exercise, walk in a grocery store or a [employer] where it is airconditioned to prevent heat  Related injury.

## 2020-02-28 NOTE — Progress Notes (Signed)
Office Visit Note   Patient: Shannon Williamson           Date of Birth: 1960/11/26           MRN: [id number] Visit Date: 02/28/2020              Requested by: Fleet Contras, MD [address] [place],  [place] [address] PCP: Fleet Contras, MD   Assessment & Plan: Visit Diagnoses:  1. S/P lumbar fusion     Plan: Avoid frequent bending and stooping  No lifting greater than 15-20 lbs. May use ice or moist heat for pain. Weight loss is of benefit. Best medication for lumbar disc disease is arthritis medications like motrin, celebrex and naprosyn. Exercise is important to improve your indurance and does allow people to function better inspite of back pain. Walking is your best exercise, walk in a grocery store or a [employer] where it is airconditioned to prevent heat  Related injury.    Follow-Up Instructions: No follow-ups on file.   Orders:  Orders Placed This Encounter  Procedures  . XR Lumbar Spine 2-3 Views   No orders of the defined types were placed in this encounter.     Procedures: No procedures performed   Clinical Data: No additional findings.   Subjective: Chief Complaint  Patient presents with  . Lower Back - Follow-up    59 year old female post L4-5 TLIF for spondylolisthesis with spinal stenosis. She reports less pain and less need for pain meds. There is some discomfort left lateral hip and trochanter. No bowel or  Bladder difficulty.    Review of Systems  Constitutional: Negative.   HENT: Negative.   Eyes: Negative.   Respiratory: Negative.   Cardiovascular: Negative.   Gastrointestinal: Negative.   Endocrine: Negative.   Genitourinary: Negative.   Musculoskeletal: Negative.   Skin: Negative.   Allergic/Immunologic: Negative.   Neurological: Negative.   Hematological: Negative.   Psychiatric/Behavioral: Negative.      Objective: Vital Signs: BP (!) 156/65 (BP Location: Left Arm, Patient Position: Sitting)   Pulse 61   Ht 5'  3" (1.6 m)   Wt 165 lb (74.8 kg)   BMI 29.23 kg/m   Physical Exam Musculoskeletal:     Lumbar back: Negative right straight leg raise test and negative left straight leg raise test.     Back Exam   Tenderness  The patient is experiencing tenderness in the lumbar.  Range of Motion  Extension: abnormal  Flexion: abnormal  Lateral bend right: normal  Lateral bend left: normal  Rotation right: normal  Rotation left: normal   Muscle Strength  Right Quadriceps:  5/5  Left Quadriceps:  5/5  Right Hamstrings:  5/5  Left Hamstrings:  5/5   Tests  Straight leg raise right: negative Straight leg raise left: negative  Reflexes  Patellar: normal Achilles: normal  Other  Toe walk: normal Heel walk: normal Sensation: normal Gait: normal       Specialty Comments:  No specialty comments available.  Imaging: No results found.   PMFS History: Patient Active Problem List   Diagnosis Date Noted  . Spondylolisthesis, lumbar region 11/15/2019    Priority: High    Class: Chronic  . Spinal stenosis, lumbar region with neurogenic claudication 11/15/2019    Priority: High    Class: Chronic  . S/P lumbar spinal fusion 11/15/2019  . Abdominal pain 02/23/2018  . Fever 02/23/2018  . Generalized body aches 02/23/2018  . Viral illness 02/23/2018  .  Acute bronchitis 02/26/2017   Past Medical History:  Diagnosis Date  . Allergy   . Arthritis   . Hypertension     History reviewed. No pertinent family history.  Past Surgical History:  Procedure Laterality Date  . CARPAL TUNNEL RELEASE Right 06/21/2013   Procedure: RIGHT CARPAL TUNNEL RELEASE;  Surgeon: Nicki Reaper, MD;  Location: [facility];  Service: Orthopedics;  Laterality: Right;  . NO PAST SURGERIES     Social History   Occupational History  . Not on file  Tobacco Use  . Smoking status: Never Smoker  . Smokeless tobacco: Never Used  Vaping Use  . Vaping Use: Never used  Substance and Sexual  Activity  . Alcohol use: No  . Drug use: No  . Sexual activity: Yes

## 2020-02-29 ENCOUNTER — Other Ambulatory Visit: Payer: Self-pay

## 2020-02-29 ENCOUNTER — Ambulatory Visit (INDEPENDENT_AMBULATORY_CARE_PROVIDER_SITE_OTHER): Payer: BC Managed Care – PPO | Admitting: Physical Therapy

## 2020-02-29 ENCOUNTER — Encounter: Payer: Self-pay | Admitting: Physical Therapy

## 2020-02-29 DIAGNOSIS — M6281 Muscle weakness (generalized): Secondary | ICD-10-CM

## 2020-02-29 DIAGNOSIS — M545 Low back pain, unspecified: Secondary | ICD-10-CM

## 2020-02-29 DIAGNOSIS — R2689 Other abnormalities of gait and mobility: Secondary | ICD-10-CM

## 2020-02-29 NOTE — Therapy (Signed)
[facility] Physical Therapy [address] [place], [place], [address] Phone: [phone]   Fax:  [phone]  Physical Therapy Treatment  Patient Details  Name: Shannon Williamson MRN: [id number] Date of Birth: 12/16/1960 Referring Provider (PT): Jessy Oto, MD   Encounter Date: 02/29/2020   PT End of Session - 02/29/20 1538    Visit Number 5    Number of Visits 16    Date for PT Re-Evaluation 03/29/20    Authorization Type BCBS    PT Start Time 6203    PT Stop Time 5597    PT Time Calculation (min) 40 min    Equipment Utilized During Treatment --   arrived in back brace, removed it for stretching then donned after   Activity Tolerance Patient tolerated treatment well    Behavior During Therapy [facility] for tasks assessed/performed           Past Medical History:  Diagnosis Date   Allergy    Arthritis    Hypertension     Past Surgical History:  Procedure Laterality Date   CARPAL TUNNEL RELEASE Right 06/21/2013   Procedure: RIGHT CARPAL TUNNEL RELEASE;  Surgeon: Wynonia Sours, MD;  Location: [place];  Service: Orthopedics;  Laterality: Right;   NO PAST SURGERIES      There were no vitals filed for this visit.   Subjective Assessment - 02/29/20 1522    Subjective denies pain when asked by PT.    Patient is accompained by: Family member   Husband interprets session   Pertinent History S/P lumbar fusion L4-5 on 11/15/19    Limitations Sitting;Lifting;Standing;Walking;House hold activities    Patient Stated Goals reduce pain and get back to normal    Pain Onset More than a month ago                             [facility] Adult PT Treatment/Exercise - 02/29/20 0001      Lumbar Exercises: Stretches   Other Lumbar Stretch Exercise seated lumbar flexion pball roll outs 10 sec X 10      Lumbar Exercises: Aerobic   Nustep 10 min L6 UE/LE      Lumbar Exercises: Machines for Strengthening   Leg Press 81# 3x10     Other Lumbar Machine Exercise Row machine and Lat pulldown 25 lbs 2x10 reps ea    Other Lumbar Machine Exercise knee extension 10# 2x10; hamstring curls 25# 2x10      Lumbar Exercises: Standing   Functional Squats 20 reps    Functional Squats Limitations with UE support at TM rail      Lumbar Exercises: Seated   Other Seated Lumbar Exercises rows and extensions with green band X 20                    PT Short Term Goals - 02/29/20 1549      PT SHORT TERM GOAL #1   Title Pt will improve lumbar ROM by 10 deg each plane to at least 20 deg    Baseline met each plane    Time 4    Period Weeks    Status Achieved    Target Date 03/01/20      PT SHORT TERM GOAL #2   Title Pt will be I and compliant with HEP.    Baseline met    Time 4    Period Weeks    Status Achieved  PT Long Term Goals - 02/02/20 1600      PT LONG TERM GOAL #1   Title Pt will improve lumbar ROM to [facility]    Time 8    Period Weeks    Status New    Target Date 03/29/20      PT LONG TERM GOAL #2   Title Pt will improve bilat leg strenght to 5/5 MMT tested in sitting to improve function    Time 8    Period Weeks    Status New      PT LONG TERM GOAL #3   Title Pt will report overall no more than 3/10 low back pain with usual ADL's and communtiy ambulation    Time 8    Period Weeks    Status New      PT LONG TERM GOAL #4   Title Pt will no longer report radicular symptoms into her legs.    Time 8    Period Weeks    Status New                 Plan - 02/29/20 1540    Clinical Impression Statement Overall continues to do very well with PT post op. She has met her short term goals. PT will continue to work on strength and ROM and progression as tolerated toward her functional goals.    Examination-Activity Limitations Lift;Stand;Stairs;Dressing;Squat;Sleep;Carry;Bend;Bathing;Bed Mobility;Locomotion Level;Transfers    Examination-Participation Restrictions Cleaning;Community  Activity;Driving;Laundry;Shop    Stability/Clinical Decision Making Stable/Uncomplicated    Rehab Potential Good    PT Frequency 2x / week    PT Duration 8 weeks    PT Treatment/Interventions ADLs/Self Care Home Management;Cryotherapy;Electrical Stimulation;Aquatic Therapy;Iontophoresis 55m/ml Dexamethasone;Moist Heat;Ultrasound;Gait training;Stair training;Therapeutic activities;Therapeutic exercise;Balance training;Neuromuscular re-education;Manual techniques;Passive range of motion;Dry needling;Joint Manipulations;Taping    PT Next Visit Plan lumbar ROM, lumbar/core/LE strength, check STGs    PT Home Exercise Plan Access Code: [email or web address]    [email or web address] Agree with Plan of Care Patient;Family member/caregiver    Family Member Consulted Husband           Patient will benefit from skilled therapeutic intervention in order to improve the following deficits and impairments:  Decreased activity tolerance, Decreased mobility, Decreased endurance, Decreased range of motion, Decreased strength, Hypomobility, Difficulty walking, Increased fascial restricitons, Increased muscle spasms, Impaired flexibility, Postural dysfunction, Improper body mechanics, Pain  Visit Diagnosis: Acute bilateral low back pain, unspecified whether sciatica present  Muscle weakness (generalized)  Other abnormalities of gait and mobility     Problem List Patient Active Problem List   Diagnosis Date Noted   Spondylolisthesis, lumbar region 11/15/2019    Class: Chronic   Spinal stenosis, lumbar region with neurogenic claudication 11/15/2019    Class: Chronic   S/P lumbar spinal fusion 11/15/2019   Abdominal pain 02/23/2018   Fever 02/23/2018   Generalized body aches 02/23/2018   Viral illness 02/23/2018   Acute bronchitis 02/26/2017    BSilvestre Mesi8/04/2020, 3:51 PM  [facility] [address] [address]Phone: [phone]  Fax:   [phone] Name: BJackquline BrancaMRN: [id number] of Birth: 1May 09, 1962

## 2020-03-02 ENCOUNTER — Encounter: Payer: Self-pay | Admitting: Physical Therapy

## 2020-03-02 ENCOUNTER — Ambulatory Visit (INDEPENDENT_AMBULATORY_CARE_PROVIDER_SITE_OTHER): Payer: BC Managed Care – PPO | Admitting: Physical Therapy

## 2020-03-02 ENCOUNTER — Other Ambulatory Visit: Payer: Self-pay

## 2020-03-02 DIAGNOSIS — M6281 Muscle weakness (generalized): Secondary | ICD-10-CM

## 2020-03-02 DIAGNOSIS — M545 Low back pain, unspecified: Secondary | ICD-10-CM

## 2020-03-02 DIAGNOSIS — R2689 Other abnormalities of gait and mobility: Secondary | ICD-10-CM

## 2020-03-02 NOTE — Therapy (Signed)
[facility] [place], [place], [address] Phone: [phone]   Fax:  [phone]  Physical Therapy Treatment  Patient Details  Name: Shannon Williamson MRN: [id number] Date of Birth: [birth year] Referring Provider (PT): Jessy Oto, MD   Encounter Date: 03/02/2020   PT End of Session - 03/02/20 1541    Visit Number 6    Number of Visits 16    Date for PT Re-Evaluation 03/29/20    Authorization Type BCBS    PT Start Time 1508    PT Stop Time 1546    PT Time Calculation (min) 38 min    Equipment Utilized During Treatment --   arrived in back brace, removed it for stretching then donned after   Activity Tolerance Patient tolerated treatment well    Behavior During Therapy [facility] for tasks assessed/performed           Past Medical History:  Diagnosis Date  . Allergy   . Arthritis   . Hypertension     Past Surgical History:  Procedure Laterality Date  . CARPAL TUNNEL RELEASE Right 06/21/2013   Procedure: RIGHT CARPAL TUNNEL RELEASE;  Surgeon: Wynonia Sours, MD;  Location: [place];  Service: Orthopedics;  Laterality: Right;  . NO PAST SURGERIES      There were no vitals filed for this visit.   Subjective Assessment - 03/02/20 1509    Subjective having a little pain today, rates it a 5/10    Patient is accompained by: Family member   Husband interprets session   Pertinent History S/P lumbar fusion L4-5 on 11/15/19    Limitations Sitting;Lifting;Standing;Walking;House hold activities    Patient Stated Goals reduce pain and get back to normal    Currently in Pain? Yes    Pain Score 5     Pain Location Back    Pain Orientation Lower    Pain Descriptors / Indicators Aching    Pain Type Surgical pain    Pain Onset More than a month ago    Pain Frequency Intermittent    Aggravating Factors  some movement/activity    Pain Relieving Factors rest                             [facility] Adult PT  Treatment/Exercise - 03/02/20 1510      Lumbar Exercises: Stretches   Other Lumbar Stretch Exercise seated lumbar flexion pball roll outs 10 sec X 10      Lumbar Exercises: Aerobic   Nustep 10 min L6 UE/LE      Lumbar Exercises: Machines for Strengthening   Leg Press 81# 3x10    Other Lumbar Machine Exercise Row machine and Lat pulldown 25 lbs 3x10 reps ea    Other Lumbar Machine Exercise knee extension 10# 3x10; hamstring curls 25# 3x10      Lumbar Exercises: Seated   Sit to Stand 20 reps   no UE support                   PT Short Term Goals - 02/29/20 1549      PT SHORT TERM GOAL #1   Title Pt will improve lumbar ROM by 10 deg each plane to at least 20 deg    Baseline met each plane    Time 4    Period Weeks    Status Achieved    Target Date 03/01/20      PT SHORT  TERM GOAL #2   Title Pt will be I and compliant with HEP.    Baseline met    Time 4    Period Weeks    Status Achieved             PT Long Term Goals - 03/02/20 1544      PT LONG TERM GOAL #1   Title Pt will improve lumbar ROM to [facility]    Time 8    Period Weeks    Status On-going    Target Date 03/29/20      PT LONG TERM GOAL #2   Title Pt will improve bilat leg strenght to 5/5 MMT tested in sitting to improve function    Time 8    Period Weeks    Status On-going      PT LONG TERM GOAL #3   Title Pt will report overall no more than 3/10 low back pain with usual ADL's and communtiy ambulation    Time 8    Period Weeks    Status On-going      PT LONG TERM GOAL #4   Title Pt will no longer report radicular symptoms into her legs.    Time 8    Period Weeks    Status On-going                 Plan - 03/02/20 1544    Clinical Impression Statement Pt tolerated session well today with continued strengthening efforts for lower extremities.  Overall continues to progress well and and will continue to benefit from PT to maximize function.    Examination-Activity Limitations  Lift;Stand;Stairs;Dressing;Squat;Sleep;Carry;Bend;Bathing;Bed Mobility;Locomotion Level;Transfers    Examination-Participation Restrictions Cleaning;Community Activity;Driving;Laundry;Shop    Stability/Clinical Decision Making Stable/Uncomplicated    Rehab Potential Good    PT Frequency 2x / week    PT Duration 8 weeks    PT Treatment/Interventions ADLs/Self Care Home Management;Cryotherapy;Electrical Stimulation;Aquatic Therapy;Iontophoresis 80m/ml Dexamethasone;Moist Heat;Ultrasound;Gait training;Stair training;Therapeutic activities;Therapeutic exercise;Balance training;Neuromuscular re-education;Manual techniques;Passive range of motion;Dry needling;Joint Manipulations;Taping    PT Next Visit Plan lumbar ROM, lumbar/core/LE strength    PT Home Exercise Plan Access Code: [email or web address]    [email or web address] Agree with Plan of Care Patient;Family member/caregiver    Family Member Consulted Husband           Patient will benefit from skilled therapeutic intervention in order to improve the following deficits and impairments:  Decreased activity tolerance, Decreased mobility, Decreased endurance, Decreased range of motion, Decreased strength, Hypomobility, Difficulty walking, Increased fascial restricitons, Increased muscle spasms, Impaired flexibility, Postural dysfunction, Improper body mechanics, Pain  Visit Diagnosis: Acute bilateral low back pain, unspecified whether sciatica present  Muscle weakness (generalized)  Other abnormalities of gait and mobility     Problem List Patient Active Problem List   Diagnosis Date Noted  . Spondylolisthesis, lumbar region 11/15/2019    Class: Chronic  . Spinal stenosis, lumbar region with neurogenic claudication 11/15/2019    Class: Chronic  . S/P lumbar spinal fusion 11/15/2019  . Abdominal pain 02/23/2018  . Fever 02/23/2018  . Generalized body aches 02/23/2018  . Viral illness 02/23/2018  . Acute bronchitis 02/26/2017      SLaureen Abrahams PT, DPT 03/02/20 3:48 PM     [facility] [address] [address]Phone: [phone]  Fax:  [phone] Name: Shannon BinesMRN: [id number] of Birth: 109-14-1962

## 2020-03-08 ENCOUNTER — Other Ambulatory Visit: Payer: Self-pay

## 2020-03-08 ENCOUNTER — Encounter: Payer: Self-pay | Admitting: Physical Therapy

## 2020-03-08 ENCOUNTER — Ambulatory Visit (INDEPENDENT_AMBULATORY_CARE_PROVIDER_SITE_OTHER): Payer: BC Managed Care – PPO | Admitting: Physical Therapy

## 2020-03-08 DIAGNOSIS — R2689 Other abnormalities of gait and mobility: Secondary | ICD-10-CM | POA: Diagnosis not present

## 2020-03-08 DIAGNOSIS — M6281 Muscle weakness (generalized): Secondary | ICD-10-CM

## 2020-03-08 DIAGNOSIS — M545 Low back pain, unspecified: Secondary | ICD-10-CM

## 2020-03-08 NOTE — Therapy (Signed)
[place], [place], [address] Phone: [phone]   Fax:  [phone]  Physical Therapy Treatment  Patient Details  Name: Shannon Williamson MRN: [id number] Date of Birth: May 27, 1961 Referring Provider (PT): Jessy Oto, MD   Encounter Date: 03/08/2020   PT End of Session - 03/08/20 1545    Visit Number 7    Number of Visits 16    Date for PT Re-Evaluation 03/29/20    Authorization Type BCBS    PT Start Time 1515    PT Stop Time 1553    PT Time Calculation (min) 38 min    Equipment Utilized During Treatment --   arrived in back brace, removed it for stretching then donned after   Activity Tolerance Patient tolerated treatment well    Behavior During Therapy [facility] for tasks assessed/performed           Past Medical History:  Diagnosis Date  . Allergy   . Arthritis   . Hypertension     Past Surgical History:  Procedure Laterality Date  . CARPAL TUNNEL RELEASE Right 06/21/2013   Procedure: RIGHT CARPAL TUNNEL RELEASE;  Surgeon: Wynonia Sours, MD;  Location: [place];  Service: Orthopedics;  Laterality: Right;  . NO PAST SURGERIES      There were no vitals filed for this visit.   Subjective Assessment - 03/08/20 1522    Subjective having a little pain today, but says back is doing good and does not have anything in paticular she is havng difficulty with    Patient is accompained by: Family member   Husband interprets session   Pertinent History S/P lumbar fusion L4-5 on 11/15/19    Limitations Sitting;Lifting;Standing;Walking;House hold activities    Patient Stated Goals reduce pain and get back to normal    Pain Onset More than a month ago              [facility] PT Assessment - 03/08/20 0001      Assessment   Medical Diagnosis S/P lumbar fusion L4-5 on 11/15/19    Referring Provider (PT) Jessy Oto, MD      AROM   Lumbar Flexion WNL    Lumbar Extension 50%    Lumbar - Right Side  Bend 75%    Lumbar - Left Side Bend 75%    Lumbar - Right Rotation 75%    Lumbar - Left Rotation 75%      Strength   Overall Strength Comments bilat leg strength 4+/5 grossly                         [facility] Adult PT Treatment/Exercise - 03/08/20 0001      Lumbar Exercises: Stretches   Other Lumbar Stretch Exercise seated lumbar flexion pball roll outs 10 sec X 10      Lumbar Exercises: Aerobic   Nustep 10 min (first 3 min L7 then dropped to L6 UE/LE_      Lumbar Exercises: Machines for Strengthening   Leg Press 87# 3x10    Other Lumbar Machine Exercise Row machine 35 lbs 3X10 and Lat pulldown 35 lbs 2X10    Other Lumbar Machine Exercise knee extension 15# 3x10; hamstring curls 35# 3x10      Lumbar Exercises: Seated   Sit to Stand 20 reps   no UE support   Other Seated Lumbar Exercises Pball isometric crunch using arms to push down into ball 5 sec 2X10  PT Short Term Goals - 02/29/20 1549      PT SHORT TERM GOAL #1   Title Pt will improve lumbar ROM by 10 deg each plane to at least 20 deg    Baseline met each plane    Time 4    Period Weeks    Status Achieved    Target Date 03/01/20      PT SHORT TERM GOAL #2   Title Pt will be I and compliant with HEP.    Baseline met    Time 4    Period Weeks    Status Achieved             PT Long Term Goals - 03/02/20 1544      PT LONG TERM GOAL #1   Title Pt will improve lumbar ROM to [facility]    Time 8    Period Weeks    Status On-going    Target Date 03/29/20      PT LONG TERM GOAL #2   Title Pt will improve bilat leg strenght to 5/5 MMT tested in sitting to improve function    Time 8    Period Weeks    Status On-going      PT LONG TERM GOAL #3   Title Pt will report overall no more than 3/10 low back pain with usual ADL's and communtiy ambulation    Time 8    Period Weeks    Status On-going      PT LONG TERM GOAL #4   Title Pt will no longer report radicular symptoms  into her legs.    Time 8    Period Weeks    Status On-going                 Plan - 03/08/20 1554    Clinical Impression Statement She was able to show big improvements in lumbar ROM with updated measurements today. She continues to progress her strengthening each week as expected. Continue POC    Examination-Activity Limitations Lift;Stand;Stairs;Dressing;Squat;Sleep;Carry;Bend;Bathing;Bed Mobility;Locomotion Level;Transfers    Examination-Participation Restrictions Cleaning;Community Activity;Driving;Laundry;Shop    Stability/Clinical Decision Making Stable/Uncomplicated    Rehab Potential Good    PT Frequency 2x / week    PT Duration 8 weeks    PT Treatment/Interventions ADLs/Self Care Home Management;Cryotherapy;Electrical Stimulation;Aquatic Therapy;Iontophoresis 68m/ml Dexamethasone;Moist Heat;Ultrasound;Gait training;Stair training;Therapeutic activities;Therapeutic exercise;Balance training;Neuromuscular re-education;Manual techniques;Passive range of motion;Dry needling;Joint Manipulations;Taping    PT Next Visit Plan lumbar ROM, lumbar/core/LE strength    PT Home Exercise Plan Access Code: [email or web address]    [email or web address] Agree with Plan of Care Patient;Family member/caregiver    Family Member Consulted Husband           Patient will benefit from skilled therapeutic intervention in order to improve the following deficits and impairments:  Decreased activity tolerance, Decreased mobility, Decreased endurance, Decreased range of motion, Decreased strength, Hypomobility, Difficulty walking, Increased fascial restricitons, Increased muscle spasms, Impaired flexibility, Postural dysfunction, Improper body mechanics, Pain  Visit Diagnosis: Acute bilateral low back pain, unspecified whether sciatica present  Muscle weakness (generalized)  Other abnormalities of gait and mobility     Problem List Patient Active Problem List   Diagnosis Date Noted  . Spondylolisthesis, lumbar  region 11/15/2019    Class: Chronic  . Spinal stenosis, lumbar region with neurogenic claudication 11/15/2019    Class: Chronic  . S/P lumbar spinal fusion 11/15/2019  . Abdominal pain 02/23/2018  . Fever 02/23/2018  . Generalized body aches 02/23/2018  . Viral illness  02/23/2018  . Acute bronchitis 02/26/2017    Silvestre Mesi 03/08/2020, 3:55 PM  [facility] Physical Therapy [address] [place], [place], [address] Phone: [phone]   Fax:  [phone]  Name: Shannon Williamson MRN: [id number] Date of Birth: 1961-07-13

## 2020-03-10 ENCOUNTER — Other Ambulatory Visit: Payer: Self-pay

## 2020-03-10 ENCOUNTER — Ambulatory Visit (INDEPENDENT_AMBULATORY_CARE_PROVIDER_SITE_OTHER): Payer: BC Managed Care – PPO | Admitting: Physical Therapy

## 2020-03-10 DIAGNOSIS — M6281 Muscle weakness (generalized): Secondary | ICD-10-CM

## 2020-03-10 DIAGNOSIS — R2689 Other abnormalities of gait and mobility: Secondary | ICD-10-CM | POA: Diagnosis not present

## 2020-03-10 DIAGNOSIS — M545 Low back pain, unspecified: Secondary | ICD-10-CM

## 2020-03-10 NOTE — Therapy (Signed)
[place], [place], [address] Phone: [phone]   Fax:  [phone]  Physical Therapy Treatment  Patient Details  Name: Shannon Williamson MRN: [id number] Date of Birth: 1961/03/16 Referring Provider (PT): Jessy Oto, MD   Encounter Date: 03/10/2020   PT End of Session - 03/10/20 1550    Visit Number 8    Number of Visits 16    Date for PT Re-Evaluation 03/29/20    Authorization Type BCBS    PT Start Time 1518    PT Stop Time 1600    PT Time Calculation (min) 42 min    Equipment Utilized During Treatment --   arrived in back brace, removed it for stretching then donned after   Activity Tolerance Patient tolerated treatment well    Behavior During Therapy [facility] for tasks assessed/performed           Past Medical History:  Diagnosis Date  . Allergy   . Arthritis   . Hypertension     Past Surgical History:  Procedure Laterality Date  . CARPAL TUNNEL RELEASE Right 06/21/2013   Procedure: RIGHT CARPAL TUNNEL RELEASE;  Surgeon: Wynonia Sours, MD;  Location: [place];  Service: Orthopedics;  Laterality: Right;  . NO PAST SURGERIES      There were no vitals filed for this visit.   Subjective Assessment - 03/10/20 1537    Subjective denies any back pain or complaints other than some soreness, possible neck pain?    Patient is accompained by: Family member    Pertinent History S/P lumbar fusion L4-5 on 11/15/19    Limitations Sitting;Lifting;Standing;Walking;House hold activities    Patient Stated Goals reduce pain and get back to normal                             [id number] Adult PT Treatment/Exercise - 03/10/20 0001      Lumbar Exercises: Stretches   Other Lumbar Stretch Exercise standing lumbar flexion L stretch 10 sec X 10      Lumbar Exercises: Aerobic   Nustep 10 min (first 3 min L7 then dropped to L6 UE/LE_      Lumbar Exercises: Machines for Strengthening   Leg Press  87# 3x10    Other Lumbar Machine Exercise Row machine 35 lbs 2X15 and Lat pulldown 35 lbs 2X10    Other Lumbar Machine Exercise --      Lumbar Exercises: Standing   Other Standing Lumbar Exercises farmers carry 10 lbs one lap in each arm    Other Standing Lumbar Exercises beginer deadlift 5 lbs from 4 inch step X 10 reps. diagonal lifts with 5 lbs X 10 bilat      Lumbar Exercises: Seated   Sit to Stand 20 reps   no UE support   Other Seated Lumbar Exercises Pball isometric crunch using arms to push down into ball 5 sec 2X10                    PT Short Term Goals - 02/29/20 1549      PT SHORT TERM GOAL #1   Title Pt will improve lumbar ROM by 10 deg each plane to at least 20 deg    Baseline met each plane    Time 4    Period Weeks    Status Achieved    Target Date 03/01/20      PT SHORT TERM GOAL #2  Title Pt will be I and compliant with HEP.    Baseline met    Time 4    Period Weeks    Status Achieved             PT Long Term Goals - 03/02/20 1544      PT LONG TERM GOAL #1   Title Pt will improve lumbar ROM to [facility]    Time 8    Period Weeks    Status On-going    Target Date 03/29/20      PT LONG TERM GOAL #2   Title Pt will improve bilat leg strenght to 5/5 MMT tested in sitting to improve function    Time 8    Period Weeks    Status On-going      PT LONG TERM GOAL #3   Title Pt will report overall no more than 3/10 low back pain with usual ADL's and communtiy ambulation    Time 8    Period Weeks    Status On-going      PT LONG TERM GOAL #4   Title Pt will no longer report radicular symptoms into her legs.    Time 8    Period Weeks    Status On-going                 Plan - 03/10/20 1551    Clinical Impression Statement Able to progress functional core strength today with good tolerance and without complaints. Overall has not had any major complaints with pain or radiculopathy since beginning PT. Continue POC    Examination-Activity  Limitations Lift;Stand;Stairs;Dressing;Squat;Sleep;Carry;Bend;Bathing;Bed Mobility;Locomotion Level;Transfers    Examination-Participation Restrictions Cleaning;Community Activity;Driving;Laundry;Shop    Stability/Clinical Decision Making Stable/Uncomplicated    Rehab Potential Good    PT Frequency 2x / week    PT Duration 8 weeks    PT Treatment/Interventions ADLs/Self Care Home Management;Cryotherapy;Electrical Stimulation;Aquatic Therapy;Iontophoresis 42m/ml Dexamethasone;Moist Heat;Ultrasound;Gait training;Stair training;Therapeutic activities;Therapeutic exercise;Balance training;Neuromuscular re-education;Manual techniques;Passive range of motion;Dry needling;Joint Manipulations;Taping    PT Next Visit Plan lumbar ROM, lumbar/core/LE strength    PT Home Exercise Plan Access Code: [email or web address]    [email or web address] Agree with Plan of Care Patient;Family member/caregiver    Family Member Consulted Husband           Patient will benefit from skilled therapeutic intervention in order to improve the following deficits and impairments:  Decreased activity tolerance, Decreased mobility, Decreased endurance, Decreased range of motion, Decreased strength, Hypomobility, Difficulty walking, Increased fascial restricitons, Increased muscle spasms, Impaired flexibility, Postural dysfunction, Improper body mechanics, Pain  Visit Diagnosis: Acute bilateral low back pain, unspecified whether sciatica present  Muscle weakness (generalized)  Other abnormalities of gait and mobility     Problem List Patient Active Problem List   Diagnosis Date Noted  . Spondylolisthesis, lumbar region 11/15/2019    Class: Chronic  . Spinal stenosis, lumbar region with neurogenic claudication 11/15/2019    Class: Chronic  . S/P lumbar spinal fusion 11/15/2019  . Abdominal pain 02/23/2018  . Fever 02/23/2018  . Generalized body aches 02/23/2018  . Viral illness 02/23/2018  . Acute bronchitis 02/26/2017    BSilvestre Mesi8/20/2021, 3:58 PM  [facility] [address] [address]Phone: [phone]  Fax:  [phone] Name: BCataleah StitesMRN: [id number] of Birth: 1March 27, 1962

## 2020-03-14 ENCOUNTER — Ambulatory Visit (INDEPENDENT_AMBULATORY_CARE_PROVIDER_SITE_OTHER): Payer: BC Managed Care – PPO | Admitting: Physical Therapy

## 2020-03-14 ENCOUNTER — Other Ambulatory Visit: Payer: Self-pay

## 2020-03-14 DIAGNOSIS — M545 Low back pain, unspecified: Secondary | ICD-10-CM

## 2020-03-14 DIAGNOSIS — R2689 Other abnormalities of gait and mobility: Secondary | ICD-10-CM | POA: Diagnosis not present

## 2020-03-14 DIAGNOSIS — M6281 Muscle weakness (generalized): Secondary | ICD-10-CM

## 2020-03-14 NOTE — Therapy (Signed)
[place], [place], [address] Phone: [phone]   Fax:  [phone]  Physical Therapy Treatment  Patient Details  Name: Shannon Williamson MRN: [id number] Date of Birth: 10-Apr-1961 Referring Provider (PT): Jessy Oto, MD   Encounter Date: 03/14/2020   PT End of Session - 03/14/20 1616    Visit Number 9    Number of Visits 16    Date for PT Re-Evaluation 03/29/20    Authorization Type BCBS    PT Start Time 1515    PT Stop Time 1600    PT Time Calculation (min) 45 min    Equipment Utilized During Treatment --   arrived in back brace, removed it for stretching then donned after   Activity Tolerance Patient tolerated treatment well    Behavior During Therapy [facility] for tasks assessed/performed           Past Medical History:  Diagnosis Date  . Allergy   . Arthritis   . Hypertension     Past Surgical History:  Procedure Laterality Date  . CARPAL TUNNEL RELEASE Right 06/21/2013   Procedure: RIGHT CARPAL TUNNEL RELEASE;  Surgeon: Wynonia Sours, MD;  Location: [place];  Service: Orthopedics;  Laterality: Right;  . NO PAST SURGERIES      There were no vitals filed for this visit.   Subjective Assessment - 03/14/20 1614    Subjective relays no pain or complaints. Nothing in paticular is bothering her back    Patient is accompained by: Family member    Pertinent History S/P lumbar fusion L4-5 on 11/15/19    Limitations Sitting;Lifting;Standing;Walking;House hold activities    Patient Stated Goals reduce pain and get back to normal    Pain Onset More than a month ago             [facility] Adult PT Treatment/Exercise - 03/14/20 0001      Lumbar Exercises: Stretches   Other Lumbar Stretch Exercise standing lumbar flexion L stretch 10 sec X 10      Lumbar Exercises: Aerobic   Nustep 10 min (first 4 min L7 then dropped to L6 UE/LE_      Lumbar Exercises: Machines for Strengthening   Leg Press  93# 3x10    Other Lumbar Machine Exercise Row machine 35 lbs 2X15 and Lat pulldown 35 lbs 2X10    Other Lumbar Machine Exercise knee extension 15# 3x10; hamstring curls 35# 3x10      Lumbar Exercises: Standing   Other Standing Lumbar Exercises farmers carry 10 lbs one lap in each arm    Other Standing Lumbar Exercises deadlift 5 lbs from floor X 15 reps with cues and demo for proper body mechanics and form. diagonal lifts with 5 lbs X 20 bilat      Lumbar Exercises: Seated   Sit to Stand --    Other Seated Lumbar Exercises Pball isometric crunch using arms to push down into ball 5 sec 2X10                    PT Short Term Goals - 02/29/20 1549      PT SHORT TERM GOAL #1   Title Pt will improve lumbar ROM by 10 deg each plane to at least 20 deg    Baseline met each plane    Time 4    Period Weeks    Status Achieved    Target Date 03/01/20      PT SHORT TERM  GOAL #2   Title Pt will be I and compliant with HEP.    Baseline met    Time 4    Period Weeks    Status Achieved             PT Long Term Goals - 03/02/20 1544      PT LONG TERM GOAL #1   Title Pt will improve lumbar ROM to [facility]    Time 8    Period Weeks    Status On-going    Target Date 03/29/20      PT LONG TERM GOAL #2   Title Pt will improve bilat leg strenght to 5/5 MMT tested in sitting to improve function    Time 8    Period Weeks    Status On-going      PT LONG TERM GOAL #3   Title Pt will report overall no more than 3/10 low back pain with usual ADL's and communtiy ambulation    Time 8    Period Weeks    Status On-going      PT LONG TERM GOAL #4   Title Pt will no longer report radicular symptoms into her legs.    Time 8    Period Weeks    Status On-going                 Plan - 03/14/20 1618    Clinical Impression Statement Able to progress deadlift from 4inch surface last visit to from floor today as well as increase reps with this. Overall continues to progress her  strength and activity tolerance very well with PT.    Examination-Activity Limitations Lift;Stand;Stairs;Dressing;Squat;Sleep;Carry;Bend;Bathing;Bed Mobility;Locomotion Level;Transfers    Examination-Participation Restrictions Cleaning;Community Activity;Driving;Laundry;Shop    Stability/Clinical Decision Making Stable/Uncomplicated    Rehab Potential Good    PT Frequency 2x / week    PT Duration 8 weeks    PT Treatment/Interventions ADLs/Self Care Home Management;Cryotherapy;Electrical Stimulation;Aquatic Therapy;Iontophoresis 18m/ml Dexamethasone;Moist Heat;Ultrasound;Gait training;Stair training;Therapeutic activities;Therapeutic exercise;Balance training;Neuromuscular re-education;Manual techniques;Passive range of motion;Dry needling;Joint Manipulations;Taping    PT Next Visit Plan lumbar ROM, lumbar/core/LE strength    PT Home Exercise Plan Access Code: [email or web address]    [email or web address] Agree with Plan of Care Patient;Family member/caregiver    Family Member Consulted Husband           Patient will benefit from skilled therapeutic intervention in order to improve the following deficits and impairments:  Decreased activity tolerance, Decreased mobility, Decreased endurance, Decreased range of motion, Decreased strength, Hypomobility, Difficulty walking, Increased fascial restricitons, Increased muscle spasms, Impaired flexibility, Postural dysfunction, Improper body mechanics, Pain  Visit Diagnosis: Acute bilateral low back pain, unspecified whether sciatica present  Muscle weakness (generalized)  Other abnormalities of gait and mobility     Problem List Patient Active Problem List   Diagnosis Date Noted  . Spondylolisthesis, lumbar region 11/15/2019    Class: Chronic  . Spinal stenosis, lumbar region with neurogenic claudication 11/15/2019    Class: Chronic  . S/P lumbar spinal fusion 11/15/2019  . Abdominal pain 02/23/2018  . Fever 02/23/2018  . Generalized body aches 02/23/2018   . Viral illness 02/23/2018  . Acute bronchitis 02/26/2017    BDebbe Odea PT,DPT 03/14/2020, 4:20 PM  [facility] [address] [address]Phone: [phone]  Fax:  [phone] Name: BZyionna PesceMRN: [id number] of Birth: 11/02/1960/03/09

## 2020-03-16 ENCOUNTER — Ambulatory Visit (INDEPENDENT_AMBULATORY_CARE_PROVIDER_SITE_OTHER): Payer: BC Managed Care – PPO | Admitting: Physical Therapy

## 2020-03-16 ENCOUNTER — Other Ambulatory Visit: Payer: Self-pay

## 2020-03-16 ENCOUNTER — Encounter: Payer: Self-pay | Admitting: Physical Therapy

## 2020-03-16 DIAGNOSIS — M545 Low back pain, unspecified: Secondary | ICD-10-CM

## 2020-03-16 DIAGNOSIS — M6281 Muscle weakness (generalized): Secondary | ICD-10-CM

## 2020-03-16 DIAGNOSIS — R2689 Other abnormalities of gait and mobility: Secondary | ICD-10-CM | POA: Diagnosis not present

## 2020-03-16 NOTE — Therapy (Signed)
[facility] [address] [place], [place], [address] Phone: [phone]   Fax:  [phone]  Physical Therapy Treatment  Patient Details  Name: Shannon Williamson MRN: [id number] Date of Birth: October 12, 1960 Referring Provider (PT): Shannon Oto, MD   Encounter Date: 03/16/2020   PT End of Session - 03/16/20 1546    Visit Number 10    Number of Visits 16    Date for PT Re-Evaluation 03/29/20    Authorization Type BCBS    PT Start Time 2641    PT Stop Time 1552    PT Time Calculation (min) 39 min    Equipment Utilized During Treatment --   arrived in back brace, removed it for stretching then donned after   Activity Tolerance Patient tolerated treatment well    Behavior During Therapy [facility] for tasks assessed/performed           Past Medical History:  Diagnosis Date  . Allergy   . Arthritis   . Hypertension     Past Surgical History:  Procedure Laterality Date  . CARPAL TUNNEL RELEASE Right 06/21/2013   Procedure: RIGHT CARPAL TUNNEL RELEASE;  Surgeon: Shannon Sours, MD;  Location: [place];  Service: Orthopedics;  Laterality: Right;  . NO PAST SURGERIES      There were no vitals filed for this visit.   Subjective Assessment - 03/16/20 1513    Subjective doing well, no pain    Patient is accompained by: Family member    Pertinent History S/P lumbar fusion L4-5 on 11/15/19    Limitations Sitting;Lifting;Standing;Walking;House hold activities    Patient Stated Goals reduce pain and get back to normal    Currently in Pain? No/denies    Pain Onset More than a month ago                             [facility] Adult PT Treatment/Exercise - 03/16/20 0001      Lumbar Exercises: Aerobic   Nustep L6x10 min      Lumbar Exercises: Machines for Strengthening   Leg Press 100# 3x10    Other Lumbar Machine Exercise Row machine 35 lbs 3x10 and Lat pulldown 35 lbs 3X10    Other Lumbar Machine Exercise knee  extension 15# 3x10; hamstring curls 35# 3x10      Lumbar Exercises: Standing   Other Standing Lumbar Exercises farmers carry 12# single arm 40' x 3      Lumbar Exercises: Supine   Isometric Hip Flexion 10 reps;5 seconds   3 sets                 PT Education - 03/16/20 1546    Education Details current progress, likely d/c early as she's doing so well            PT Short Term Goals - 02/29/20 1549      PT SHORT TERM GOAL #1   Title Pt will improve lumbar ROM by 10 deg each plane to at least 20 deg    Baseline met each plane    Time 4    Period Weeks    Status Achieved    Target Date 03/01/20      PT SHORT TERM GOAL #2   Title Pt will be I and compliant with HEP.    Baseline met    Time 4    Period Weeks    Status Achieved  PT Long Term Goals - 03/02/20 1544      PT LONG TERM GOAL #1   Title Pt will improve lumbar ROM to [facility]    Time 8    Period Weeks    Status On-going    Target Date 03/29/20      PT LONG TERM GOAL #2   Title Pt will improve bilat leg strenght to 5/5 MMT tested in sitting to improve function    Time 8    Period Weeks    Status On-going      PT LONG TERM GOAL #3   Title Pt will report overall no more than 3/10 low back pain with usual ADL's and communtiy ambulation    Time 8    Period Weeks    Status On-going      PT LONG TERM GOAL #4   Title Pt will no longer report radicular symptoms into her legs.    Time 8    Period Weeks    Status On-going                 Plan - 03/16/20 1547    Clinical Impression Statement Pt continues to tolerate sessions well without increase in pain.  Anticipate she will be ready for d/c next 1-2 visits if continues to progress well.    Examination-Activity Limitations Lift;Stand;Stairs;Dressing;Squat;Sleep;Carry;Bend;Bathing;Bed Mobility;Locomotion Level;Transfers    Examination-Participation Restrictions Cleaning;Community Activity;Driving;Laundry;Shop    Stability/Clinical  Decision Making Stable/Uncomplicated    Rehab Potential Good    PT Frequency 2x / week    PT Duration 8 weeks    PT Treatment/Interventions ADLs/Self Care Home Management;Cryotherapy;Electrical Stimulation;Aquatic Therapy;Iontophoresis 78m/ml Dexamethasone;Moist Heat;Ultrasound;Gait training;Stair training;Therapeutic activities;Therapeutic exercise;Balance training;Neuromuscular re-education;Manual techniques;Passive range of motion;Dry needling;Joint Manipulations;Taping    PT Next Visit Plan look at goals, consider d/c if goals met    PT Home Exercise Plan Access Code: [email or web address]    Consulted and Agree with Plan of Care Patient;Family member/caregiver    Family Member Consulted Husband           Patient will benefit from skilled therapeutic intervention in order to improve the following deficits and impairments:  Decreased activity tolerance, Decreased mobility, Decreased endurance, Decreased range of motion, Decreased strength, Hypomobility, Difficulty walking, Increased fascial restricitons, Increased muscle spasms, Impaired flexibility, Postural dysfunction, Improper body mechanics, Pain  Visit Diagnosis: Acute bilateral low back pain, unspecified whether sciatica present  Muscle weakness (generalized)  Other abnormalities of gait and mobility     Problem List Patient Active Problem List   Diagnosis Date Noted  . Spondylolisthesis, lumbar region 11/15/2019    Class: Chronic  . Spinal stenosis, lumbar region with neurogenic claudication 11/15/2019    Class: Chronic  . S/P lumbar spinal fusion 11/15/2019  . Abdominal pain 02/23/2018  . Fever 02/23/2018  . Generalized body aches 02/23/2018  . Viral illness 02/23/2018  . Acute bronchitis 02/26/2017      SLaureen Williamson PT, DPT 03/16/20 3:48 PM    [facility] [address] [address]Phone: [phone]  Fax:  [phone] Name: BEmanie BehanMRN:  [id number] of Birth: 12/05/1960-05-18

## 2020-03-20 ENCOUNTER — Other Ambulatory Visit: Payer: Self-pay

## 2020-03-20 ENCOUNTER — Ambulatory Visit (INDEPENDENT_AMBULATORY_CARE_PROVIDER_SITE_OTHER): Payer: BC Managed Care – PPO | Admitting: Physical Therapy

## 2020-03-20 ENCOUNTER — Encounter: Payer: Self-pay | Admitting: Physical Therapy

## 2020-03-20 DIAGNOSIS — R2689 Other abnormalities of gait and mobility: Secondary | ICD-10-CM | POA: Diagnosis not present

## 2020-03-20 DIAGNOSIS — M6281 Muscle weakness (generalized): Secondary | ICD-10-CM | POA: Diagnosis not present

## 2020-03-20 DIAGNOSIS — M545 Low back pain, unspecified: Secondary | ICD-10-CM

## 2020-03-20 NOTE — Therapy (Signed)
[place], [place], [address] Phone: [phone]   Fax:  [phone]  Physical Therapy Treatment/Discharge Summary  Patient Details  Name: Shannon Williamson MRN: [id number] Date of Birth: 1960-12-15 Referring Provider (PT): Jessy Oto, MD   Encounter Date: 03/20/2020   PT End of Session - 03/20/20 1541    Visit Number 11    Number of Visits 16    Date for PT Re-Evaluation 03/29/20    Authorization Type BCBS    PT Start Time 1502    PT Stop Time 1541    PT Time Calculation (min) 39 min    Equipment Utilized During Treatment --   arrived in back brace, removed it for stretching then donned after   Activity Tolerance Patient tolerated treatment well    Behavior During Therapy [facility] for tasks assessed/performed           Past Medical History:  Diagnosis Date  . Allergy   . Arthritis   . Hypertension     Past Surgical History:  Procedure Laterality Date  . CARPAL TUNNEL RELEASE Right 06/21/2013   Procedure: RIGHT CARPAL TUNNEL RELEASE;  Surgeon: Wynonia Sours, MD;  Location: [facility] [facility];  Service: Orthopedics;  Laterality: Right;  . NO PAST SURGERIES      There were no vitals filed for this visit.   Subjective Assessment - 03/20/20 1507    Subjective doing okay, having some pain today.    Patient is accompained by: Family member    Pertinent History S/P lumbar fusion L4-5 on 11/15/19    Limitations Sitting;Lifting;Standing;Walking;House hold activities    Patient Stated Goals reduce pain and get back to normal    Currently in Pain? Yes    Pain Score 4     Pain Location Back    Pain Orientation Lower    Pain Descriptors / Indicators Aching    Pain Type Surgical pain    Pain Onset More than a month ago    Pain Frequency Intermittent    Aggravating Factors  activity    Pain Relieving Factors rest              [facility] PT Assessment - 03/20/20 1513      Assessment   Medical Diagnosis  S/P lumbar fusion L4-5 on 11/15/19    Referring Provider (PT) Jessy Oto, MD      AROM   Lumbar Flexion WNL    Lumbar Extension WNL    Lumbar - Right Side Bend WNL    Lumbar - Left Side Bend WNL    Lumbar - Right Rotation WNL    Lumbar - Left Rotation WNL      Strength   Overall Strength Comments bilat leg strength 5/5 grossly                         [facility] Adult PT Treatment/Exercise - 03/20/20 1508      Lumbar Exercises: Aerobic   Nustep L6x10 min      Lumbar Exercises: Machines for Strengthening   Leg Press 100# 3x10    Other Lumbar Machine Exercise Row machine 35 lbs 3x10 and Lat pulldown 35 lbs 3X10    Other Lumbar Machine Exercise knee extension 15# 3x10; hamstring curls 25# 3x10      Lumbar Exercises: Standing   Other Standing Lumbar Exercises farmers carry 10# single arm 40' x 3  PT Short Term Goals - 02/29/20 1549      PT SHORT TERM GOAL #1   Title Pt will improve lumbar ROM by 10 deg each plane to at least 20 deg    Baseline met each plane    Time 4    Period Weeks    Status Achieved    Target Date 03/01/20      PT SHORT TERM GOAL #2   Title Pt will be I and compliant with HEP.    Baseline met    Time 4    Period Weeks    Status Achieved             PT Long Term Goals - 03/20/20 1541      PT LONG TERM GOAL #1   Title Pt will improve lumbar ROM to [facility]    Time 8    Period Weeks    Status Achieved      PT LONG TERM GOAL #2   Title Pt will improve bilat leg strenght to 5/5 MMT tested in sitting to improve function    Time 8    Period Weeks    Status Achieved      PT LONG TERM GOAL #3   Title Pt will report overall no more than 3/10 low back pain with usual ADL's and communtiy ambulation    Baseline 8/30: occasional flare up to 4/10, overall pain < 3/10    Time 8    Period Weeks    Status Achieved      PT LONG TERM GOAL #4   Title Pt will no longer report radicular symptoms into her legs.     Time 8    Period Weeks    Status Achieved                 Plan - 03/20/20 1542    Clinical Impression Statement Pt has met all goals and is ready for d/c from PT today.  Will d/c PT.    Examination-Activity Limitations Lift;Stand;Stairs;Dressing;Squat;Sleep;Carry;Bend;Bathing;Bed Mobility;Locomotion Level;Transfers    Examination-Participation Restrictions Cleaning;Community Activity;Driving;Laundry;Shop    Stability/Clinical Decision Making Stable/Uncomplicated    Rehab Potential Good    PT Frequency 2x / week    PT Duration 8 weeks    PT Treatment/Interventions ADLs/Self Care Home Management;Cryotherapy;Electrical Stimulation;Aquatic Therapy;Iontophoresis 61m/ml Dexamethasone;Moist Heat;Ultrasound;Gait training;Stair training;Therapeutic activities;Therapeutic exercise;Balance training;Neuromuscular re-education;Manual techniques;Passive range of motion;Dry needling;Joint Manipulations;Taping    PT Next Visit Plan d/c PT    PT Home Exercise Plan Access Code: [email or web address]    Consulted and Agree with Plan of Care Patient;Family member/caregiver    Family Member Consulted Husband           Patient will benefit from skilled therapeutic intervention in order to improve the following deficits and impairments:  Decreased activity tolerance, Decreased mobility, Decreased endurance, Decreased range of motion, Decreased strength, Hypomobility, Difficulty walking, Increased fascial restricitons, Increased muscle spasms, Impaired flexibility, Postural dysfunction, Improper body mechanics, Pain  Visit Diagnosis: Acute bilateral low back pain, unspecified whether sciatica present  Muscle weakness (generalized)  Other abnormalities of gait and mobility     Problem List Patient Active Problem List   Diagnosis Date Noted  . Spondylolisthesis, lumbar region 11/15/2019    Class: Chronic  . Spinal stenosis, lumbar region with neurogenic claudication 11/15/2019    Class: Chronic  . S/P  lumbar spinal fusion 11/15/2019  . Abdominal pain 02/23/2018  . Fever 02/23/2018  . Generalized body aches 02/23/2018  . Viral illness 02/23/2018  .  Acute bronchitis 02/26/2017      Laureen Abrahams, PT, DPT 03/20/20 3:43 PM   [facility] [address] [place], [place], [address] Phone: [phone]   Fax:  [phone]  Name: Kabao Leite MRN: [id number] Date of Birth: 1960/12/23    PHYSICAL THERAPY DISCHARGE SUMMARY  Visits from Start of Care: 11  Current functional level related to goals / functional outcomes: See above   Remaining deficits: See above   Education / Equipment: HEP  Plan: Patient agrees to discharge.  Patient goals were met. Patient is being discharged due to meeting the stated rehab goals.  ?????    Laureen Abrahams, PT, DPT 03/20/20 3:43 PM  [facility] [address] [place], [place], [address] Phone: [phone]   Fax:  [phone]

## 2020-03-22 ENCOUNTER — Encounter: Payer: BC Managed Care – PPO | Admitting: Physical Therapy

## 2020-03-29 ENCOUNTER — Encounter: Payer: BC Managed Care – PPO | Admitting: Physical Therapy

## 2020-03-31 ENCOUNTER — Encounter: Payer: BC Managed Care – PPO | Admitting: Physical Therapy

## 2020-05-11 DIAGNOSIS — I10 Essential (primary) hypertension: Secondary | ICD-10-CM | POA: Diagnosis not present

## 2020-05-11 DIAGNOSIS — E7849 Other hyperlipidemia: Secondary | ICD-10-CM | POA: Diagnosis not present

## 2020-05-11 DIAGNOSIS — J302 Other seasonal allergic rhinitis: Secondary | ICD-10-CM | POA: Diagnosis not present

## 2020-05-11 DIAGNOSIS — Z23 Encounter for immunization: Secondary | ICD-10-CM | POA: Diagnosis not present

## 2020-05-11 DIAGNOSIS — M19011 Primary osteoarthritis, right shoulder: Secondary | ICD-10-CM | POA: Diagnosis not present

## 2020-05-31 ENCOUNTER — Ambulatory Visit: Payer: Self-pay

## 2020-05-31 ENCOUNTER — Other Ambulatory Visit: Payer: Self-pay | Admitting: Specialist

## 2020-05-31 ENCOUNTER — Encounter: Payer: Self-pay | Admitting: Specialist

## 2020-05-31 ENCOUNTER — Ambulatory Visit (INDEPENDENT_AMBULATORY_CARE_PROVIDER_SITE_OTHER): Payer: BC Managed Care – PPO | Admitting: Specialist

## 2020-05-31 VITALS — BP 161/72 | HR 61 | Ht 63.0 in | Wt 165.0 lb

## 2020-05-31 DIAGNOSIS — M778 Other enthesopathies, not elsewhere classified: Secondary | ICD-10-CM | POA: Diagnosis not present

## 2020-05-31 DIAGNOSIS — Z981 Arthrodesis status: Secondary | ICD-10-CM

## 2020-05-31 DIAGNOSIS — M7541 Impingement syndrome of right shoulder: Secondary | ICD-10-CM

## 2020-05-31 DIAGNOSIS — M5441 Lumbago with sciatica, right side: Secondary | ICD-10-CM

## 2020-05-31 DIAGNOSIS — M4316 Spondylolisthesis, lumbar region: Secondary | ICD-10-CM

## 2020-05-31 DIAGNOSIS — M25511 Pain in right shoulder: Secondary | ICD-10-CM

## 2020-05-31 MED ORDER — METHYLPREDNISOLONE ACETATE 40 MG/ML IJ SUSP
40.0000 mg | INTRAMUSCULAR | Status: AC | PRN
  Administered 2020-05-31: 40 mg via INTRA_ARTICULAR

## 2020-05-31 MED ORDER — BUPIVACAINE HCL 0.5 % IJ SOLN
3.0000 mL | INTRAMUSCULAR | Status: AC | PRN
  Administered 2020-05-31: 3 mL via INTRA_ARTICULAR

## 2020-05-31 MED ORDER — TIZANIDINE HCL 4 MG PO TABS: 4.0000 mg | ORAL_TABLET | Freq: Four times a day (QID) | ORAL | 0 refills | Status: DC | PRN

## 2020-05-31 MED ORDER — NAPROXEN 500 MG PO TABS: 500.0000 mg | ORAL_TABLET | Freq: Two times a day (BID) | ORAL | 0 refills | Status: DC

## 2020-05-31 NOTE — Progress Notes (Signed)
Office Visit Note   Patient: Shannon Williamson           Date of Birth: 15-Mar-1961           MRN: [id number] Visit Date: 05/31/2020              Requested by: Fleet Contras, MD [address] [place],  [place] [address] PCP: Fleet Contras, MD   Assessment & Plan: Visit Diagnoses:  1. Impingement syndrome of right shoulder   2. Right shoulder tendonitis   3. Spondylolisthesis of lumbar region   4. S/P lumbar fusion     Plan: Avoid overhead lifting and overhead use of the arms. Pillows to keep from sleeping directly on the shoulders Limited lifting to less than 10 lbs. Ice or heat for relief. NSAIDs are helpful, such as alleve or motrin, or naprosyn, be careful not to use in excess as they place burdens on the kidney. Stretching exercise help and strengthening is helpful to build endurance.  Follow-Up Instructions: Return in about 3 weeks (around 06/21/2020).   Orders:  No orders of the defined types were placed in this encounter.  No orders of the defined types were placed in this encounter.     Procedures: Large Joint Inj: R subacromial bursa on 05/31/2020 9:28 AM Indications: pain Details: 25 G 1.5 in needle, posterior approach  Arthrogram: No  Medications: 40 mg methylPREDNISolone acetate 40 MG/ML; 3 mL bupivacaine 0.5 % Outcome: tolerated well, no immediate complications Procedure, treatment alternatives, risks and benefits explained, specific risks discussed. Consent was given by the patient. Immediately prior to procedure a time out was called to verify the correct patient, procedure, equipment, support staff and site/side marked as required. Patient was prepped and draped in the usual sterile fashion.       Clinical Data: No additional findings.   Subjective: Chief Complaint  Patient presents with  . Lower Back - Follow-up  . Right Shoulder - Pain    59  Year old female with history of lumbar fusion L4-5 for spondylolisthesis 10/2019, this is  doing well with the back and is having more trouble with her right shoulder. Pain with lying on the right side shoulder, night pain and difficulty with overhead use of the right shoulder. There is catching and popping with ROM. No injection or therapy in the past.  No history of neck pain or previous right shoulder injury.No numbness or tingling into the arms excepts sometimes at night in the right arm. She has had history of right carpal tunnel  Release by Dr.   Review of Systems  Constitutional: Negative.   HENT: Negative.   Eyes: Negative.   Respiratory: Negative.   Cardiovascular: Negative.   Gastrointestinal: Negative.   Endocrine: Negative.   Genitourinary: Negative.   Musculoskeletal: Negative.   Skin: Negative.   Allergic/Immunologic: Negative.   Neurological: Negative.   Hematological: Negative.   Psychiatric/Behavioral: Negative.      Objective: Vital Signs: BP (!) 161/72 (BP Location: Left Arm, Patient Position: Sitting)   Pulse 61   Ht 5\' 3"  (1.6 m)   Wt 165 lb (74.8 kg)   BMI 29.23 kg/m   Physical Exam Constitutional:      Appearance: She is well-developed.  HENT:     Head: Normocephalic and atraumatic.  Eyes:     Pupils: Pupils are equal, round, and reactive to light.  Pulmonary:     Effort: Pulmonary effort is normal.     Breath sounds: Normal breath sounds.  Abdominal:  General: Bowel sounds are normal.     Palpations: Abdomen is soft.  Musculoskeletal:     Cervical back: Normal range of motion and neck supple.     Lumbar back: Negative right straight leg raise test and negative left straight leg raise test.  Skin:    General: Skin is warm and dry.  Neurological:     Mental Status: She is alert and oriented to person, place, and time.  Psychiatric:        Behavior: Behavior normal.        Thought Content: Thought content normal.        Judgment: Judgment normal.     Back Exam   Tenderness  The patient is experiencing tenderness in the  lumbar.  Range of Motion  Extension: normal  Flexion: normal  Lateral bend right: abnormal  Lateral bend left: abnormal  Rotation right: abnormal  Rotation left: abnormal   Muscle Strength  Right Quadriceps:  5/5  Left Quadriceps:  5/5  Right Hamstrings:  5/5  Left Hamstrings:  5/5   Tests  Straight leg raise right: negative Straight leg raise left: negative  Reflexes  Patellar: 2/4 Achilles: 2/4  Other  Toe walk: abnormal Heel walk: abnormal Sensation: normal Gait: normal  Erythema: no back redness Scars: absent   Right Shoulder Exam   Tenderness  The patient is experiencing tenderness in the acromion.  Range of Motion  Active abduction: normal  Passive abduction: normal  Extension: normal  External rotation: normal  Forward flexion: normal  Internal rotation 0 degrees: normal  Internal rotation 90 degrees: normal   Muscle Strength  Abduction: 4/5  Internal rotation: 5/5  External rotation: 5/5  Supraspinatus: 4/5  Subscapularis: 5/5  Biceps: 5/5   Tests  Apprehension: positive Hawkins test: negative Cross arm: negative Impingement: positive Drop arm: negative Sulcus: absent  Other  Erythema: absent Scars: present  Comments:  Scar from measles vaccination      Specialty Comments:  No specialty comments available.  Imaging: XR Lumbar Spine 2-3 Views  Result Date: 05/31/2020 AP and lateral flexion and extension radiographs of the lumbar spine demonstrate pedicle screws and cage at L4-5 with grade one-two anterolisthesis bone is bridging the interspace and with flexion and extension there is no motion over the anterior segment L4-5 greater than 38mm when measured from similar points across the L4-5 level. Fusion appears solid.   XR Shoulder Right  Result Date: 05/31/2020 3 way view, AP, outlet view and axillary lateral demonstrate well maintained G-H jint with narrowing of the SAS to 8.5 mm, mild degenerative sclerosis and osteophyte  over the greater tuberosity laterally. AC joint with osteophyte over the dorsal distal clavicle, joint line is well maintained. Findings concerning for right shoulder cuff arthropathy that is early.     PMFS History: Patient Active Problem List   Diagnosis Date Noted  . Spondylolisthesis, lumbar region 11/15/2019    Priority: High    Class: Chronic  . Spinal stenosis, lumbar region with neurogenic claudication 11/15/2019    Priority: High    Class: Chronic  . S/P lumbar spinal fusion 11/15/2019  . Abdominal pain 02/23/2018  . Fever 02/23/2018  . Generalized body aches 02/23/2018  . Viral illness 02/23/2018  . Acute bronchitis 02/26/2017   Past Medical History:  Diagnosis Date  . Allergy   . Arthritis   . Hypertension     History reviewed. No pertinent family history.  Past Surgical History:  Procedure Laterality Date  . CARPAL TUNNEL  RELEASE Right 06/21/2013   Procedure: RIGHT CARPAL TUNNEL RELEASE;  Surgeon: Nicki Reaper, MD;  Location: [facility];  Service: Orthopedics;  Laterality: Right;  . NO PAST SURGERIES     Social History   Occupational History  . Not on file  Tobacco Use  . Smoking status: Never Smoker  . Smokeless tobacco: Never Used  Vaping Use  . Vaping Use: Never used  Substance and Sexual Activity  . Alcohol use: No  . Drug use: No  . Sexual activity: Yes

## 2020-05-31 NOTE — Patient Instructions (Signed)
Avoid overhead lifting and overhead use of the arms. Pillows to keep from sleeping directly on the shoulders Limited lifting to less than 10 lbs. Ice or heat for relief. NSAIDs are helpful, such as alleve or motrin, be careful not to use in excess as they place burdens on the kidney. Stretching exercise help and strengthening is helpful to build endurance.  Shoulder Exercises Ask your health care provider which exercises are safe for you. Do exercises exactly as told by your health care provider and adjust them as directed. It is normal to feel mild stretching, pulling, tightness, or discomfort as you do these exercises. Stop right away if you feel sudden pain or your pain gets worse. Do not begin these exercises until told by your health care provider. Stretching exercises External rotation and abduction This exercise is sometimes called corner stretch. This exercise rotates your arm outward (external rotation) and moves your arm out from your body (abduction). 1. Stand in a doorway with one of your feet slightly in front of the other. This is called a staggered stance. If you cannot reach your forearms to the door frame, stand facing a corner of a room. 2. Choose one of the following positions as told by your health care provider: ? Place your hands and forearms on the door frame above your head. ? Place your hands and forearms on the door frame at the height of your head. ? Place your hands on the door frame at the height of your elbows. 3. Slowly move your weight onto your front foot until you feel a stretch across your chest and in the front of your shoulders. Keep your head and chest upright and keep your abdominal muscles tight. 4. Hold for __________ seconds. 5. To release the stretch, shift your weight to your back foot. Repeat __________ times. Complete this exercise __________ times a day. Extension, standing 1. Stand and hold a broomstick, a cane, or a similar object behind your  back. ? Your hands should be a little wider than shoulder width apart. ? Your palms should face away from your back. 2. Keeping your elbows straight and your shoulder muscles relaxed, move the stick away from your body until you feel a stretch in your shoulders (extension). ? Avoid shrugging your shoulders while you move the stick. Keep your shoulder blades tucked down toward the middle of your back. 3. Hold for __________ seconds. 4. Slowly return to the starting position. Repeat __________ times. Complete this exercise __________ times a day. Range-of-motion exercises Pendulum  1. Stand near a wall or a surface that you can hold onto for balance. 2. Bend at the waist and let your left / right arm hang straight down. Use your other arm to support you. Keep your back straight and do not lock your knees. 3. Relax your left / right arm and shoulder muscles, and move your hips and your trunk so your left / right arm swings freely. Your arm should swing because of the motion of your body, not because you are using your arm or shoulder muscles. 4. Keep moving your hips and trunk so your arm swings in the following directions, as told by your health care provider: ? Side to side. ? Forward and backward. ? In clockwise and counterclockwise circles. 5. Continue each motion for __________ seconds, or for as long as told by your health care provider. 6. Slowly return to the starting position. Repeat __________ times. Complete this exercise __________ times a day. Shoulder flexion,  standing  1. Stand and hold a broomstick, a cane, or a similar object. Place your hands a little more than shoulder width apart on the object. Your left / right hand should be palm up, and your other hand should be palm down. 2. Keep your elbow straight and your shoulder muscles relaxed. Push the stick up with your healthy arm to raise your left / right arm in front of your body, and then over your head until you feel a  stretch in your shoulder (flexion). ? Avoid shrugging your shoulder while you raise your arm. Keep your shoulder blade tucked down toward the middle of your back. 3. Hold for __________ seconds. 4. Slowly return to the starting position. Repeat __________ times. Complete this exercise __________ times a day. Shoulder abduction, standing 1. Stand and hold a broomstick, a cane, or a similar object. Place your hands a little more than shoulder width apart on the object. Your left / right hand should be palm up, and your other hand should be palm down. 2. Keep your elbow straight and your shoulder muscles relaxed. Push the object across your body toward your left / right side. Raise your left / right arm to the side of your body (abduction) until you feel a stretch in your shoulder. ? Do not raise your arm above shoulder height unless your health care provider tells you to do that. ? If directed, raise your arm over your head. ? Avoid shrugging your shoulder while you raise your arm. Keep your shoulder blade tucked down toward the middle of your back. 3. Hold for __________ seconds. 4. Slowly return to the starting position. Repeat __________ times. Complete this exercise __________ times a day. Internal rotation  1. Place your left / right hand behind your back, palm up. 2. Use your other hand to dangle an exercise band, a towel, or a similar object over your shoulder. Grasp the band with your left / right hand so you are holding on to both ends. 3. Gently pull up on the band until you feel a stretch in the front of your left / right shoulder. The movement of your arm toward the center of your body is called internal rotation. ? Avoid shrugging your shoulder while you raise your arm. Keep your shoulder blade tucked down toward the middle of your back. 4. Hold for __________ seconds. 5. Release the stretch by letting go of the band and lowering your hands. Repeat __________ times. Complete this  exercise __________ times a day. Strengthening exercises External rotation  1. Sit in a stable chair without armrests. 2. Secure an exercise band to a stable object at elbow height on your left / right side. 3. Place a soft object, such as a folded towel or a small pillow, between your left / right upper arm and your body to move your elbow about 4 inches (10 cm) away from your side. 4. Hold the end of the exercise band so it is tight and there is no slack. 5. Keeping your elbow pressed against the soft object, slowly move your forearm out, away from your abdomen (external rotation). Keep your body steady so only your forearm moves. 6. Hold for __________ seconds. 7. Slowly return to the starting position. Repeat __________ times. Complete this exercise __________ times a day. Shoulder abduction  1. Sit in a stable chair without armrests, or stand up. 2. Hold a __________ weight in your left / right hand, or hold an exercise band with both  hands. 3. Start with your arms straight down and your left / right palm facing in, toward your body. 4. Slowly lift your left / right hand out to your side (abduction). Do not lift your hand above shoulder height unless your health care provider tells you that this is safe. ? Keep your arms straight. ? Avoid shrugging your shoulder while you do this movement. Keep your shoulder blade tucked down toward the middle of your back. 5. Hold for __________ seconds. 6. Slowly lower your arm, and return to the starting position. Repeat __________ times. Complete this exercise __________ times a day. Shoulder extension 1. Sit in a stable chair without armrests, or stand up. 2. Secure an exercise band to a stable object in front of you so it is at shoulder height. 3. Hold one end of the exercise band in each hand. Your palms should face each other. 4. Straighten your elbows and lift your hands up to shoulder height. 5. Step back, away from the secured end of the  exercise band, until the band is tight and there is no slack. 6. Squeeze your shoulder blades together as you pull your hands down to the sides of your thighs (extension). Stop when your hands are straight down by your sides. Do not let your hands go behind your body. 7. Hold for __________ seconds. 8. Slowly return to the starting position. Repeat __________ times. Complete this exercise __________ times a day. Shoulder row 1. Sit in a stable chair without armrests, or stand up. 2. Secure an exercise band to a stable object in front of you so it is at waist height. 3. Hold one end of the exercise band in each hand. Position your palms so that your thumbs are facing the ceiling (neutral position). 4. Bend each of your elbows to a 90-degree angle (right angle) and keep your upper arms at your sides. 5. Step back until the band is tight and there is no slack. 6. Slowly pull your elbows back behind you. 7. Hold for __________ seconds. 8. Slowly return to the starting position. Repeat __________ times. Complete this exercise __________ times a day. Shoulder press-ups  1. Sit in a stable chair that has armrests. Sit upright, with your feet flat on the floor. 2. Put your hands on the armrests so your elbows are bent and your fingers are pointing forward. Your hands should be about even with the sides of your body. 3. Push down on the armrests and use your arms to lift yourself off the chair. Straighten your elbows and lift yourself up as much as you comfortably can. ? Move your shoulder blades down, and avoid letting your shoulders move up toward your ears. ? Keep your feet on the ground. As you get stronger, your feet should support less of your body weight as you lift yourself up. 4. Hold for __________ seconds. 5. Slowly lower yourself back into the chair. Repeat __________ times. Complete this exercise __________ times a day. Wall push-ups  1. Stand so you are facing a stable wall. Your feet  should be about one arm-length away from the wall. 2. Lean forward and place your palms on the wall at shoulder height. 3. Keep your feet flat on the floor as you bend your elbows and lean forward toward the wall. 4. Hold for __________ seconds. 5. Straighten your elbows to push yourself back to the starting position. Repeat __________ times. Complete this exercise __________ times a day. This information is not intended to  replace advice given to you by your health care provider. Make sure you discuss any questions you have with your health care provider. Document Revised: 10/30/2018 Document Reviewed: 08/07/2018 Elsevier Patient Education  2020 [employer].

## 2020-06-06 ENCOUNTER — Other Ambulatory Visit: Payer: Self-pay | Admitting: Internal Medicine

## 2020-06-06 DIAGNOSIS — Z1231 Encounter for screening mammogram for malignant neoplasm of breast: Secondary | ICD-10-CM

## 2020-06-29 ENCOUNTER — Ambulatory Visit (INDEPENDENT_AMBULATORY_CARE_PROVIDER_SITE_OTHER): Payer: BC Managed Care – PPO | Admitting: Specialist

## 2020-06-29 ENCOUNTER — Encounter: Payer: Self-pay | Admitting: Specialist

## 2020-06-29 ENCOUNTER — Other Ambulatory Visit: Payer: Self-pay

## 2020-06-29 VITALS — BP 144/76 | HR 62 | Ht 63.0 in | Wt 165.0 lb

## 2020-06-29 DIAGNOSIS — M7541 Impingement syndrome of right shoulder: Secondary | ICD-10-CM | POA: Diagnosis not present

## 2020-06-29 DIAGNOSIS — M4316 Spondylolisthesis, lumbar region: Secondary | ICD-10-CM

## 2020-06-29 DIAGNOSIS — M778 Other enthesopathies, not elsewhere classified: Secondary | ICD-10-CM | POA: Diagnosis not present

## 2020-06-29 DIAGNOSIS — Z981 Arthrodesis status: Secondary | ICD-10-CM | POA: Diagnosis not present

## 2020-06-29 MED ORDER — NAPROXEN 500 MG PO TABS: 500.0000 mg | ORAL_TABLET | Freq: Two times a day (BID) | ORAL | 2 refills | Status: DC

## 2020-06-29 NOTE — Patient Instructions (Signed)
Avoid frequent bending and stooping  No lifting greater than 10 lbs. May use ice or moist heat for pain. Weight loss is of benefit. Best medication for lumbar disc disease is arthritis medications like motrin, celebrex and naprosyn. Exercise is important to improve your indurance and does allow people to function better inspite of back pain. Avoid overhead lifting and overhead use of the arms. Pillows to keep from sleeping directly on the shoulders Limited lifting to less than 10 lbs. Ice or heat for relief. NSAIDs are helpful, such as alleve or motrin, be careful not to use in excess as they place burdens on the kidney. Stretching exercise help and strengthening is helpful to build endurance.  Shoulder Exercises Ask your health care provider which exercises are safe for you. Do exercises exactly as told by your health care provider and adjust them as directed. It is normal to feel mild stretching, pulling, tightness, or discomfort as you do these exercises. Stop right away if you feel sudden pain or your pain gets worse. Do not begin these exercises until told by your health care provider. Stretching exercises External rotation and abduction This exercise is sometimes called corner stretch. This exercise rotates your arm outward (external rotation) and moves your arm out from your body (abduction). 1. Stand in a doorway with one of your feet slightly in front of the other. This is called a staggered stance. If you cannot reach your forearms to the door frame, stand facing a corner of a room. 2. Choose one of the following positions as told by your health care provider: ? Place your hands and forearms on the door frame above your head. ? Place your hands and forearms on the door frame at the height of your head. ? Place your hands on the door frame at the height of your elbows. 3. Slowly move your weight onto your front foot until you feel a stretch across your chest and in the front of your  shoulders. Keep your head and chest upright and keep your abdominal muscles tight. 4. Hold for __________ seconds. 5. To release the stretch, shift your weight to your back foot. Repeat __________ times. Complete this exercise __________ times a day. Extension, standing 1. Stand and hold a broomstick, a cane, or a similar object behind your back. ? Your hands should be a little wider than shoulder width apart. ? Your palms should face away from your back. 2. Keeping your elbows straight and your shoulder muscles relaxed, move the stick away from your body until you feel a stretch in your shoulders (extension). ? Avoid shrugging your shoulders while you move the stick. Keep your shoulder blades tucked down toward the middle of your back. 3. Hold for __________ seconds. 4. Slowly return to the starting position. Repeat __________ times. Complete this exercise __________ times a day. Range-of-motion exercises Pendulum  1. Stand near a wall or a surface that you can hold onto for balance. 2. Bend at the waist and let your left / right arm hang straight down. Use your other arm to support you. Keep your back straight and do not lock your knees. 3. Relax your left / right arm and shoulder muscles, and move your hips and your trunk so your left / right arm swings freely. Your arm should swing because of the motion of your body, not because you are using your arm or shoulder muscles. 4. Keep moving your hips and trunk so your arm swings in the following directions, as  told by your health care provider: ? Side to side. ? Forward and backward. ? In clockwise and counterclockwise circles. 5. Continue each motion for __________ seconds, or for as long as told by your health care provider. 6. Slowly return to the starting position. Repeat __________ times. Complete this exercise __________ times a day. Shoulder flexion, standing  1. Stand and hold a broomstick, a cane, or a similar object. Place your  hands a little more than shoulder width apart on the object. Your left / right hand should be palm up, and your other hand should be palm down. 2. Keep your elbow straight and your shoulder muscles relaxed. Push the stick up with your healthy arm to raise your left / right arm in front of your body, and then over your head until you feel a stretch in your shoulder (flexion). ? Avoid shrugging your shoulder while you raise your arm. Keep your shoulder blade tucked down toward the middle of your back. 3. Hold for __________ seconds. 4. Slowly return to the starting position. Repeat __________ times. Complete this exercise __________ times a day. Shoulder abduction, standing 1. Stand and hold a broomstick, a cane, or a similar object. Place your hands a little more than shoulder width apart on the object. Your left / right hand should be palm up, and your other hand should be palm down. 2. Keep your elbow straight and your shoulder muscles relaxed. Push the object across your body toward your left / right side. Raise your left / right arm to the side of your body (abduction) until you feel a stretch in your shoulder. ? Do not raise your arm above shoulder height unless your health care provider tells you to do that. ? If directed, raise your arm over your head. ? Avoid shrugging your shoulder while you raise your arm. Keep your shoulder blade tucked down toward the middle of your back. 3. Hold for __________ seconds. 4. Slowly return to the starting position. Repeat __________ times. Complete this exercise __________ times a day. Internal rotation  1. Place your left / right hand behind your back, palm up. 2. Use your other hand to dangle an exercise band, a towel, or a similar object over your shoulder. Grasp the band with your left / right hand so you are holding on to both ends. 3. Gently pull up on the band until you feel a stretch in the front of your left / right shoulder. The movement of your  arm toward the center of your body is called internal rotation. ? Avoid shrugging your shoulder while you raise your arm. Keep your shoulder blade tucked down toward the middle of your back. 4. Hold for __________ seconds. 5. Release the stretch by letting go of the band and lowering your hands. Repeat __________ times. Complete this exercise __________ times a day. Strengthening exercises External rotation  1. Sit in a stable chair without armrests. 2. Secure an exercise band to a stable object at elbow height on your left / right side. 3. Place a soft object, such as a folded towel or a small pillow, between your left / right upper arm and your body to move your elbow about 4 inches (10 cm) away from your side. 4. Hold the end of the exercise band so it is tight and there is no slack. 5. Keeping your elbow pressed against the soft object, slowly move your forearm out, away from your abdomen (external rotation). Keep your body steady so only  your forearm moves. 6. Hold for __________ seconds. 7. Slowly return to the starting position. Repeat __________ times. Complete this exercise __________ times a day. Shoulder abduction  1. Sit in a stable chair without armrests, or stand up. 2. Hold a __________ weight in your left / right hand, or hold an exercise band with both hands. 3. Start with your arms straight down and your left / right palm facing in, toward your body. 4. Slowly lift your left / right hand out to your side (abduction). Do not lift your hand above shoulder height unless your health care provider tells you that this is safe. ? Keep your arms straight. ? Avoid shrugging your shoulder while you do this movement. Keep your shoulder blade tucked down toward the middle of your back. 5. Hold for __________ seconds. 6. Slowly lower your arm, and return to the starting position. Repeat __________ times. Complete this exercise __________ times a day. Shoulder extension 1. Sit in a  stable chair without armrests, or stand up. 2. Secure an exercise band to a stable object in front of you so it is at shoulder height. 3. Hold one end of the exercise band in each hand. Your palms should face each other. 4. Straighten your elbows and lift your hands up to shoulder height. 5. Step back, away from the secured end of the exercise band, until the band is tight and there is no slack. 6. Squeeze your shoulder blades together as you pull your hands down to the sides of your thighs (extension). Stop when your hands are straight down by your sides. Do not let your hands go behind your body. 7. Hold for __________ seconds. 8. Slowly return to the starting position. Repeat __________ times. Complete this exercise __________ times a day. Shoulder row 1. Sit in a stable chair without armrests, or stand up. 2. Secure an exercise band to a stable object in front of you so it is at waist height. 3. Hold one end of the exercise band in each hand. Position your palms so that your thumbs are facing the ceiling (neutral position). 4. Bend each of your elbows to a 90-degree angle (right angle) and keep your upper arms at your sides. 5. Step back until the band is tight and there is no slack. 6. Slowly pull your elbows back behind you. 7. Hold for __________ seconds. 8. Slowly return to the starting position. Repeat __________ times. Complete this exercise __________ times a day. Shoulder press-ups  1. Sit in a stable chair that has armrests. Sit upright, with your feet flat on the floor. 2. Put your hands on the armrests so your elbows are bent and your fingers are pointing forward. Your hands should be about even with the sides of your body. 3. Push down on the armrests and use your arms to lift yourself off the chair. Straighten your elbows and lift yourself up as much as you comfortably can. ? Move your shoulder blades down, and avoid letting your shoulders move up toward your ears. ? Keep  your feet on the ground. As you get stronger, your feet should support less of your body weight as you lift yourself up. 4. Hold for __________ seconds. 5. Slowly lower yourself back into the chair. Repeat __________ times. Complete this exercise __________ times a day. Wall push-ups  1. Stand so you are facing a stable wall. Your feet should be about one arm-length away from the wall. 2. Lean forward and place your palms on  the wall at shoulder height. 3. Keep your feet flat on the floor as you bend your elbows and lean forward toward the wall. 4. Hold for __________ seconds. 5. Straighten your elbows to push yourself back to the starting position. Repeat __________ times. Complete this exercise __________ times a day. This information is not intended to replace advice given to you by your health care provider. Make sure you discuss any questions you have with your health care provider. Document Revised: 10/30/2018 Document Reviewed: 08/07/2018 [employer] Patient Education  2020 [employer]

## 2020-06-29 NOTE — Addendum Note (Signed)
Addended by: Vira Browns on: 06/29/2020 02:53 PM   Modules accepted: Orders

## 2020-06-29 NOTE — Progress Notes (Signed)
Office Visit Note   Patient: Shannon Williamson           Date of Birth: 07/04/1961           MRN: [id number] Visit Date: 06/29/2020              Requested by: Fleet ContrasAvbuere, Edwin, MD [address] [place] [id number] PCP: Fleet ContrasAvbuere, Edwin, MD   Assessment & Plan: Visit Diagnoses:  1. Right shoulder tendonitis   2. Spondylolisthesis of lumbar region   3. S/P lumbar fusion     Plan:  Avoid frequent bending and stooping  No lifting greater than 10 lbs. May use ice or moist heat for pain. Weight loss is of benefit. Best medication for lumbar disc disease is arthritis medications like motrin, celebrex and naprosyn. Exercise is important to improve your indurance and does allow people to function better inspite of back pain. Avoid overhead lifting and overhead use of the arms. Pillows to keep from sleeping directly on the shoulders Limited lifting to less than 10 lbs. Ice or heat for relief. NSAIDs are helpful, such as alleve or motrin, be careful not to use in excess as they place burdens on the kidney. Stretching exercise help and strengthening is helpful to build endurance.  Shoulder Exercises Ask your health care provider which exercises are safe for you. Do exercises exactly as told by your health care provider and adjust them as directed. It is normal to feel mild stretching, pulling, tightness, or discomfort as you do these exercises. Stop right away if you feel sudden pain or your pain gets worse. Do not begin these exercises until told by your health care provider. Stretching exercises External rotation and abduction This exercise is sometimes called corner stretch. This exercise rotates your arm outward (external rotation) and moves your arm out from your body (abduction). 1. Stand in a doorway with one of your feet slightly in front of the other. This is called a staggered stance. If you cannot reach your forearms to the door frame, stand facing a corner of a  room. 2. Choose one of the following positions as told by your health care provider: ? Place your hands and forearms on the door frame above your head. ? Place your hands and forearms on the door frame at the height of your head. ? Place your hands on the door frame at the height of your elbows. 3. Slowly move your weight onto your front foot until you feel a stretch across your chest and in the front of your shoulders. Keep your head and chest upright and keep your abdominal muscles tight. 4. Hold for __________ seconds. 5. To release the stretch, shift your weight to your back foot. Repeat __________ times. Complete this exercise __________ times a day. Extension, standing 1. Stand and hold a broomstick, a cane, or a similar object behind your back. ? Your hands should be a little wider than shoulder width apart. ? Your palms should face away from your back. 2. Keeping your elbows straight and your shoulder muscles relaxed, move the stick away from your body until you feel a stretch in your shoulders (extension). ? Avoid shrugging your shoulders while you move the stick. Keep your shoulder blades tucked down toward the middle of your back. 3. Hold for __________ seconds. 4. Slowly return to the starting position. Repeat __________ times. Complete this exercise __________ times a day. Range-of-motion exercises Pendulum  1. Stand near a wall or a surface that you can  hold onto for balance. 2. Bend at the waist and let your left / right arm hang straight down. Use your other arm to support you. Keep your back straight and do not lock your knees. 3. Relax your left / right arm and shoulder muscles, and move your hips and your trunk so your left / right arm swings freely. Your arm should swing because of the motion of your body, not because you are using your arm or shoulder muscles. 4. Keep moving your hips and trunk so your arm swings in the following directions, as told by your health care  provider: ? Side to side. ? Forward and backward. ? In clockwise and counterclockwise circles. 5. Continue each motion for __________ seconds, or for as long as told by your health care provider. 6. Slowly return to the starting position. Repeat __________ times. Complete this exercise __________ times a day. Shoulder flexion, standing  1. Stand and hold a broomstick, a cane, or a similar object. Place your hands a little more than shoulder width apart on the object. Your left / right hand should be palm up, and your other hand should be palm down. 2. Keep your elbow straight and your shoulder muscles relaxed. Push the stick up with your healthy arm to raise your left / right arm in front of your body, and then over your head until you feel a stretch in your shoulder (flexion). ? Avoid shrugging your shoulder while you raise your arm. Keep your shoulder blade tucked down toward the middle of your back. 3. Hold for __________ seconds. 4. Slowly return to the starting position. Repeat __________ times. Complete this exercise __________ times a day. Shoulder abduction, standing 1. Stand and hold a broomstick, a cane, or a similar object. Place your hands a little more than shoulder width apart on the object. Your left / right hand should be palm up, and your other hand should be palm down. 2. Keep your elbow straight and your shoulder muscles relaxed. Push the object across your body toward your left / right side. Raise your left / right arm to the side of your body (abduction) until you feel a stretch in your shoulder. ? Do not raise your arm above shoulder height unless your health care provider tells you to do that. ? If directed, raise your arm over your head. ? Avoid shrugging your shoulder while you raise your arm. Keep your shoulder blade tucked down toward the middle of your back. 3. Hold for __________ seconds. 4. Slowly return to the starting position. Repeat __________ times. Complete  this exercise __________ times a day. Internal rotation  1. Place your left / right hand behind your back, palm up. 2. Use your other hand to dangle an exercise band, a towel, or a similar object over your shoulder. Grasp the band with your left / right hand so you are holding on to both ends. 3. Gently pull up on the band until you feel a stretch in the front of your left / right shoulder. The movement of your arm toward the center of your body is called internal rotation. ? Avoid shrugging your shoulder while you raise your arm. Keep your shoulder blade tucked down toward the middle of your back. 4. Hold for __________ seconds. 5. Release the stretch by letting go of the band and lowering your hands. Repeat __________ times. Complete this exercise __________ times a day. Strengthening exercises External rotation  1. Sit in a stable chair without armrests.  2. Secure an exercise band to a stable object at elbow height on your left / right side. 3. Place a soft object, such as a folded towel or a small pillow, between your left / right upper arm and your body to move your elbow about 4 inches (10 cm) away from your side. 4. Hold the end of the exercise band so it is tight and there is no slack. 5. Keeping your elbow pressed against the soft object, slowly move your forearm out, away from your abdomen (external rotation). Keep your body steady so only your forearm moves. 6. Hold for __________ seconds. 7. Slowly return to the starting position. Repeat __________ times. Complete this exercise __________ times a day. Shoulder abduction  1. Sit in a stable chair without armrests, or stand up. 2. Hold a __________ weight in your left / right hand, or hold an exercise band with both hands. 3. Start with your arms straight down and your left / right palm facing in, toward your body. 4. Slowly lift your left / right hand out to your side (abduction). Do not lift your hand above shoulder height  unless your health care provider tells you that this is safe. ? Keep your arms straight. ? Avoid shrugging your shoulder while you do this movement. Keep your shoulder blade tucked down toward the middle of your back. 5. Hold for __________ seconds. 6. Slowly lower your arm, and return to the starting position. Repeat __________ times. Complete this exercise __________ times a day. Shoulder extension 1. Sit in a stable chair without armrests, or stand up. 2. Secure an exercise band to a stable object in front of you so it is at shoulder height. 3. Hold one end of the exercise band in each hand. Your palms should face each other. 4. Straighten your elbows and lift your hands up to shoulder height. 5. Step back, away from the secured end of the exercise band, until the band is tight and there is no slack. 6. Squeeze your shoulder blades together as you pull your hands down to the sides of your thighs (extension). Stop when your hands are straight down by your sides. Do not let your hands go behind your body. 7. Hold for __________ seconds. 8. Slowly return to the starting position. Repeat __________ times. Complete this exercise __________ times a day. Shoulder row 1. Sit in a stable chair without armrests, or stand up. 2. Secure an exercise band to a stable object in front of you so it is at waist height. 3. Hold one end of the exercise band in each hand. Position your palms so that your thumbs are facing the ceiling (neutral position). 4. Bend each of your elbows to a 90-degree angle (right angle) and keep your upper arms at your sides. 5. Step back until the band is tight and there is no slack. 6. Slowly pull your elbows back behind you. 7. Hold for __________ seconds. 8. Slowly return to the starting position. Repeat __________ times. Complete this exercise __________ times a day. Shoulder press-ups  1. Sit in a stable chair that has armrests. Sit upright, with your feet flat on the  floor. 2. Put your hands on the armrests so your elbows are bent and your fingers are pointing forward. Your hands should be about even with the sides of your body. 3. Push down on the armrests and use your arms to lift yourself off the chair. Straighten your elbows and lift yourself up as much as  you comfortably can. ? Move your shoulder blades down, and avoid letting your shoulders move up toward your ears. ? Keep your feet on the ground. As you get stronger, your feet should support less of your body weight as you lift yourself up. 4. Hold for __________ seconds. 5. Slowly lower yourself back into the chair. Repeat __________ times. Complete this exercise __________ times a day. Wall push-ups  1. Stand so you are facing a stable wall. Your feet should be about one arm-length away from the wall. 2. Lean forward and place your palms on the wall at shoulder height. 3. Keep your feet flat on the floor as you bend your elbows and lean forward toward the wall. 4. Hold for __________ seconds. 5. Straighten your elbows to push yourself back to the starting position. Repeat __________ times. Complete this exercise __________ times a day. This information is not intended to replace advice given to you by your health care provider. Make sure you discuss any questions you have with your health care provider. Document Revised: 10/30/2018 Document Reviewed: 08/07/2018 Elsevier Patient Education  2020 [employer].    Follow-Up Instructions: Return in about 6 months (around 12/28/2020).   Orders:  No orders of the defined types were placed in this encounter.  No orders of the defined types were placed in this encounter.     Procedures: No procedures performed   Clinical Data: No additional findings.   Subjective: Chief Complaint  Patient presents with  . Right Shoulder - Follow-up  . Lower Back - Follow-up    59 year old female with history of TLIF for spondylolisthesis. She had  right shoulder SAS injection for tendonitis and has seen improvement in the right shoulder pain. No numbness or tingling. No gate disturbance. The lower back is feeling better.    Review of Systems  Constitutional: Negative.   HENT: Negative.   Eyes: Negative.   Respiratory: Negative.   Cardiovascular: Negative.   Gastrointestinal: Negative.   Endocrine: Negative.   Genitourinary: Negative.   Musculoskeletal: Negative.   Skin: Negative.   Allergic/Immunologic: Negative.   Neurological: Negative.   Hematological: Negative.   Psychiatric/Behavioral: Negative.      Objective: Vital Signs: BP (!) 144/76 (BP Location: Left Arm, Patient Position: Sitting)   Pulse 62   Ht  (1.6 m)   Wt 165 lb (74.8 kg)   BMI 29.23 kg/m   Physical Exam Constitutional:      Appearance: She is well-developed and well-nourished.  HENT:     Head: Normocephalic and atraumatic.  Eyes:     Extraocular Movements: EOM normal.     Pupils: Pupils are equal, round, and reactive to light.  Pulmonary:     Effort: Pulmonary effort is normal.     Breath sounds: Normal breath sounds.  Abdominal:     General: Bowel sounds are normal.     Palpations: Abdomen is soft.  Musculoskeletal:     Cervical back: Normal range of motion and neck supple.     Lumbar back: Negative right straight leg raise test.  Skin:    General: Skin is warm and dry.  Neurological:     Mental Status: She is alert and oriented to person, place, and time.  Psychiatric:        Mood and Affect: Mood and affect normal.        Behavior: Behavior normal.        Thought Content: Thought content normal.  Judgment: Judgment normal.     Back Exam   Tenderness  The patient is experiencing tenderness in the lumbar.  Range of Motion  Extension: abnormal  Flexion: abnormal  Lateral bend right: abnormal  Lateral bend left: abnormal  Rotation right: abnormal  Rotation left: abnormal   Muscle Strength  Right Quadriceps:   5/5  Left Quadriceps:  5/5  Right Hamstrings:  5/5  Left Hamstrings:  5/5   Tests  Straight leg raise right: negative  Reflexes  Patellar: 2/4 Achilles: 2/4 Biceps: 2/4      Specialty Comments:  No specialty comments available.  Imaging: No results found.   PMFS History: Patient Active Problem List   Diagnosis Date Noted  . Spondylolisthesis, lumbar region 11/15/2019    Priority: High    Class: Chronic  . Spinal stenosis, lumbar region with neurogenic claudication 11/15/2019    Priority: High    Class: Chronic  . S/P lumbar spinal fusion 11/15/2019  . Abdominal pain 02/23/2018  . Fever 02/23/2018  . Generalized body aches 02/23/2018  . Viral illness 02/23/2018  . Acute bronchitis 02/26/2017   Past Medical History:  Diagnosis Date  . Allergy   . Arthritis   . Hypertension     No family history on file.  Past Surgical History:  Procedure Laterality Date  . CARPAL TUNNEL RELEASE Right 06/21/2013   Procedure: RIGHT CARPAL TUNNEL RELEASE;  Surgeon: Nicki Reaper, MD;  Location: [facility];  Service: Orthopedics;  Laterality: Right;  . NO PAST SURGERIES     Social History   Occupational History  . Not on file  Tobacco Use  . Smoking status: Never Smoker  . Smokeless tobacco: Never Used  Vaping Use  . Vaping Use: Never used  Substance and Sexual Activity  . Alcohol use: No  . Drug use: No  . Sexual activity: Yes

## 2020-07-01 DIAGNOSIS — R0689 Other abnormalities of breathing: Secondary | ICD-10-CM | POA: Diagnosis not present

## 2020-07-01 DIAGNOSIS — R202 Paresthesia of skin: Secondary | ICD-10-CM | POA: Diagnosis not present

## 2020-07-01 DIAGNOSIS — R52 Pain, unspecified: Secondary | ICD-10-CM | POA: Diagnosis not present

## 2020-07-01 DIAGNOSIS — R42 Dizziness and giddiness: Secondary | ICD-10-CM | POA: Diagnosis not present

## 2020-07-19 ENCOUNTER — Ambulatory Visit
Admission: RE | Admit: 2020-07-19 | Discharge: 2020-07-19 | Disposition: A | Discharge status: Home / Self Care | Payer: BC Managed Care – PPO | Source: Ambulatory Visit | Attending: Internal Medicine | Admitting: Internal Medicine

## 2020-07-19 ENCOUNTER — Other Ambulatory Visit: Payer: Self-pay

## 2020-07-19 DIAGNOSIS — Z1231 Encounter for screening mammogram for malignant neoplasm of breast: Secondary | ICD-10-CM | POA: Diagnosis not present

## 2020-08-17 ENCOUNTER — Other Ambulatory Visit: Payer: Self-pay | Admitting: Internal Medicine

## 2020-08-17 DIAGNOSIS — E559 Vitamin D deficiency, unspecified: Secondary | ICD-10-CM | POA: Diagnosis not present

## 2020-08-17 DIAGNOSIS — R7303 Prediabetes: Secondary | ICD-10-CM | POA: Diagnosis not present

## 2020-08-17 DIAGNOSIS — I10 Essential (primary) hypertension: Secondary | ICD-10-CM | POA: Diagnosis not present

## 2020-08-17 DIAGNOSIS — E7849 Other hyperlipidemia: Secondary | ICD-10-CM | POA: Diagnosis not present

## 2020-08-17 DIAGNOSIS — K219 Gastro-esophageal reflux disease without esophagitis: Secondary | ICD-10-CM | POA: Diagnosis not present

## 2020-08-17 DIAGNOSIS — Z0000 Encounter for general adult medical examination without abnormal findings: Secondary | ICD-10-CM | POA: Diagnosis not present

## 2020-08-18 LAB — CBC
HCT: 35 % (ref 35.0–45.0)
Hemoglobin: 11.8 g/dL (ref 11.7–15.5)
MCH: 30.2 pg (ref 27.0–33.0)
MCHC: 33.7 g/dL (ref 32.0–36.0)
MCV: 89.5 fL (ref 80.0–100.0)
MPV: 12.6 fL — ABNORMAL HIGH (ref 7.5–12.5)
Platelets: 238 10*3/uL (ref 140–400)
RBC: 3.91 10*6/uL (ref 3.80–5.10)
RDW: 12.8 % (ref 11.0–15.0)
WBC: 5.9 10*3/uL (ref 3.8–10.8)

## 2020-08-18 LAB — LIPID PANEL
Cholesterol: 186 mg/dL (ref <200)
HDL: 43 mg/dL — ABNORMAL LOW (ref >=50)
LDL Cholesterol (Calc): 113 mg/dL (calc) — ABNORMAL HIGH
Non-HDL Cholesterol (Calc): 143 mg/dL (calc) — ABNORMAL HIGH (ref <130)
Total CHOL/HDL Ratio: 4.3 (calc) (ref <5.0)
Triglycerides: 183 mg/dL — ABNORMAL HIGH (ref <150)

## 2020-08-18 LAB — COMPLETE METABOLIC PANEL WITH GFR
AG Ratio: 1.4 (calc) (ref 1.0–2.5)
ALT: 21 U/L (ref 6–29)
AST: 26 U/L (ref 10–35)
Albumin: 4 g/dL (ref 3.6–5.1)
Alkaline phosphatase (APISO): 81 U/L (ref 37–153)
BUN: 15 mg/dL (ref 7–25)
CO2: 26 mmol/L (ref 20–32)
Calcium: 9.6 mg/dL (ref 8.6–10.4)
Chloride: 102 mmol/L (ref 98–110)
Creat: 0.98 mg/dL (ref 0.50–1.05)
GFR, Est African American: 73 mL/min/{1.73_m2} (ref >=60)
GFR, Est Non African American: 63 mL/min/{1.73_m2} (ref >=60)
Globulin: 2.8 g/dL (calc) (ref 1.9–3.7)
Glucose, Bld: 135 mg/dL — ABNORMAL HIGH (ref 65–99)
Potassium: 3.8 mmol/L (ref 3.5–5.3)
Sodium: 140 mmol/L (ref 135–146)
Total Bilirubin: 0.6 mg/dL (ref 0.2–1.2)
Total Protein: 6.8 g/dL (ref 6.1–8.1)

## 2020-08-18 LAB — VITAMIN D 25 HYDROXY (VIT D DEFICIENCY, FRACTURES): Vit D, 25-Hydroxy: 35 ng/mL (ref 30–100)

## 2020-09-14 DIAGNOSIS — Z20822 Contact with and (suspected) exposure to covid-19: Secondary | ICD-10-CM | POA: Diagnosis not present

## 2020-09-16 DIAGNOSIS — Z20822 Contact with and (suspected) exposure to covid-19: Secondary | ICD-10-CM | POA: Diagnosis not present

## 2020-09-17 DIAGNOSIS — Z20822 Contact with and (suspected) exposure to covid-19: Secondary | ICD-10-CM | POA: Diagnosis not present

## 2020-12-28 ENCOUNTER — Ambulatory Visit: Payer: BC Managed Care – PPO | Admitting: Specialist

## 2021-01-04 ENCOUNTER — Other Ambulatory Visit: Payer: Self-pay

## 2021-01-04 ENCOUNTER — Ambulatory Visit: Payer: Self-pay

## 2021-01-04 ENCOUNTER — Encounter: Payer: Self-pay | Admitting: Specialist

## 2021-01-04 ENCOUNTER — Ambulatory Visit (INDEPENDENT_AMBULATORY_CARE_PROVIDER_SITE_OTHER): Payer: BC Managed Care – PPO | Admitting: Specialist

## 2021-01-04 VITALS — BP 137/72 | HR 55 | Ht 63.0 in | Wt 165.0 lb

## 2021-01-04 DIAGNOSIS — M5440 Lumbago with sciatica, unspecified side: Secondary | ICD-10-CM

## 2021-01-04 DIAGNOSIS — R200 Anesthesia of skin: Secondary | ICD-10-CM | POA: Diagnosis not present

## 2021-01-04 DIAGNOSIS — M5416 Radiculopathy, lumbar region: Secondary | ICD-10-CM | POA: Diagnosis not present

## 2021-01-04 DIAGNOSIS — Z981 Arthrodesis status: Secondary | ICD-10-CM

## 2021-01-04 MED ORDER — GABAPENTIN 300 MG PO CAPS
300.0000 mg | ORAL_CAPSULE | Freq: Every day | ORAL | 3 refills | Status: DC
Start: 2021-01-04 — End: 2021-09-10

## 2021-01-04 MED ORDER — MELOXICAM 15 MG PO TABS
15.0000 mg | ORAL_TABLET | Freq: Every day | ORAL | 2 refills | Status: DC
Start: 2021-01-04 — End: 2022-01-17

## 2021-01-04 NOTE — Patient Instructions (Signed)
Avoid frequent bending and stooping  No lifting greater than 10 lbs. May use ice or moist heat for pain. Weight loss is of benefit. Best medication for lumbar disc disease is arthritis medications like motrin, celebrex and naprosyn. Exercise is important to improve your indurance and does allow people to function better inspite of back pain. Try gabapentin to help to decrease nerve discomfort.

## 2021-01-04 NOTE — Progress Notes (Signed)
Office Visit Note   Patient: Shannon Williamson           Date of Birth: October 11, 1960           MRN: [id number] Visit Date: 01/04/2021              Requested by: Fleet Contras, MD [address] [place],  [place] [address] PCP: Fleet Contras, MD   Assessment & Plan: Visit Diagnoses:  1. Low back pain with sciatica, sciatica laterality unspecified, unspecified back pain laterality, unspecified chronicity   2. S/P lumbar fusion   3. Bilateral leg numbness   4. Lumbar radiculopathy, chronic     Plan: Avoid frequent bending and stooping  No lifting greater than 10 lbs. May use ice or moist heat for pain. Weight loss is of benefit. Best medication for lumbar disc disease is arthritis medications like motrin, celebrex and naprosyn. Exercise is important to improve your indurance and does allow people to function better inspite of back pain. Try gabapentin to help to decrease nerve discomfort. Vitamin B6 and B12 in a Vitamin B complex tablet daily.  Follow-Up Instructions: Return in about 2 months (around 03/06/2021).   Orders:  Orders Placed This Encounter  Procedures   XR Lumbar Spine Complete W/Bend   Meds ordered this encounter  Medications   gabapentin (NEURONTIN) 300 MG capsule    Sig: Take 1 capsule (300 mg total) by mouth at bedtime.    Dispense:  90 capsule    Refill:  3   meloxicam (MOBIC) 15 MG tablet    Sig: Take 1 tablet (15 mg total) by mouth daily.    Dispense:  90 tablet    Refill:  2      Procedures: No procedures performed   Clinical Data: No additional findings.   Subjective: Chief Complaint  Patient presents with   Lower Back - Follow-up    Doing so-so   Right Shoulder - Follow-up    Doing so-so    60 year old female nearly 14 months following lumbar laminectomy and fusion for an isthmic spondylolisthesis L4-5 with severe foramenal and central spinal stenosis. She was last seen about 6 months ago. She complains of pain into the left  upper buttock and numbness and tingling that persists into both of her legs from the knees downward. She is able to squat Has pain with forward bending and stooping. No bowel or bladder difficulty.    Review of Systems  Constitutional: Negative.   HENT: Negative.    Eyes: Negative.   Respiratory: Negative.    Cardiovascular: Negative.   Gastrointestinal: Negative.   Endocrine: Negative.   Genitourinary: Negative.   Musculoskeletal: Negative.   Skin: Negative.   Allergic/Immunologic: Negative.   Neurological: Negative.   Hematological: Negative.   Psychiatric/Behavioral: Negative.      Objective: Vital Signs: BP 137/72 (BP Location: Left Arm, Patient Position: Sitting)   Pulse (!) 55   Ht 5\' 3"  (1.6 m)   Wt 165 lb (74.8 kg)   BMI 29.23 kg/m   Physical Exam Constitutional:      Appearance: She is well-developed.  HENT:     Head: Normocephalic and atraumatic.  Eyes:     Pupils: Pupils are equal, round, and reactive to light.  Pulmonary:     Effort: Pulmonary effort is normal.     Breath sounds: Normal breath sounds.  Abdominal:     General: Bowel sounds are normal.     Palpations: Abdomen is soft.  Musculoskeletal:  Cervical back: Normal range of motion and neck supple.  Skin:    General: Skin is warm and dry.  Neurological:     Mental Status: She is alert and oriented to person, place, and time.  Psychiatric:        Behavior: Behavior normal.        Thought Content: Thought content normal.        Judgment: Judgment normal.   Back Exam   Tenderness  The patient is experiencing tenderness in the lumbar.  Range of Motion  Extension:  normal  Flexion:  abnormal  Lateral bend right:  abnormal  Lateral bend left:  abnormal  Rotation right:  abnormal  Rotation left:  abnormal     Specialty Comments:  No specialty comments available.  Imaging: No results found.   PMFS History: Patient Active Problem List   Diagnosis Date Noted    Spondylolisthesis, lumbar region 11/15/2019    Priority: High    Class: Chronic   Spinal stenosis, lumbar region with neurogenic claudication 11/15/2019    Priority: High    Class: Chronic   S/P lumbar spinal fusion 11/15/2019   Abdominal pain 02/23/2018   Fever 02/23/2018   Generalized body aches 02/23/2018   Viral illness 02/23/2018   Acute bronchitis 02/26/2017   Past Medical History:  Diagnosis Date   Allergy    Arthritis    Hypertension     No family history on file.  Past Surgical History:  Procedure Laterality Date   CARPAL TUNNEL RELEASE Right 06/21/2013   Procedure: RIGHT CARPAL TUNNEL RELEASE;  Surgeon: Nicki Reaper, MD;  Location: [facility];  Service: Orthopedics;  Laterality: Right;   NO PAST SURGERIES     Social History   Occupational History   Not on file  Tobacco Use   Smoking status: Never   Smokeless tobacco: Never  Vaping Use   Vaping Use: Never used  Substance and Sexual Activity   Alcohol use: No   Drug use: No   Sexual activity: Yes

## 2021-01-16 DIAGNOSIS — E7849 Other hyperlipidemia: Secondary | ICD-10-CM | POA: Diagnosis not present

## 2021-01-16 DIAGNOSIS — I10 Essential (primary) hypertension: Secondary | ICD-10-CM | POA: Diagnosis not present

## 2021-01-16 DIAGNOSIS — J302 Other seasonal allergic rhinitis: Secondary | ICD-10-CM | POA: Diagnosis not present

## 2021-01-16 DIAGNOSIS — R7303 Prediabetes: Secondary | ICD-10-CM | POA: Diagnosis not present

## 2021-03-08 ENCOUNTER — Other Ambulatory Visit: Payer: Self-pay

## 2021-03-08 ENCOUNTER — Encounter: Payer: Self-pay | Admitting: Specialist

## 2021-03-08 ENCOUNTER — Ambulatory Visit (INDEPENDENT_AMBULATORY_CARE_PROVIDER_SITE_OTHER): Payer: BC Managed Care – PPO | Admitting: Specialist

## 2021-03-08 VITALS — BP 168/82 | HR 54 | Ht 63.0 in | Wt 165.0 lb

## 2021-03-08 DIAGNOSIS — Z981 Arthrodesis status: Secondary | ICD-10-CM | POA: Diagnosis not present

## 2021-03-08 DIAGNOSIS — M5416 Radiculopathy, lumbar region: Secondary | ICD-10-CM | POA: Diagnosis not present

## 2021-03-08 DIAGNOSIS — R200 Anesthesia of skin: Secondary | ICD-10-CM

## 2021-03-08 NOTE — Patient Instructions (Signed)
Avoid frequent bending and stooping  No lifting greater than 25-30 lbs. May use ice or moist heat for pain. Weight loss is of benefit. Best medication for lumbar disc disease is arthritis medications like meloxicam, motrin, celebrex and naprosyn. Exercise is important to improve your indurance and does allow people to function better inspite of back pain.

## 2021-03-08 NOTE — Progress Notes (Signed)
   Office Visit Note   Patient: Shannon Williamson           Date of Birth: August 08, 1960           MRN: [id number] Visit Date: 03/08/2021              Requested by: Fleet Contras, MD [address] [place],  [place] [address] PCP: Fleet Contras, MD   Assessment & Plan: Visit Diagnoses:  1. S/P lumbar fusion   2. Bilateral leg numbness   3. Lumbar radiculopathy, chronic     Plan: Avoid frequent bending and stooping  No lifting greater than 25-30 lbs. May use ice or moist heat for pain. Weight loss is of benefit. Best medication for lumbar disc disease is arthritis medications like meloxicam, motrin, celebrex and naprosyn. Exercise is important to improve your indurance and does allow people to function better inspite of back pain.  Follow-Up Instructions: Return in about 6 months (around 09/08/2021).   Orders:  No orders of the defined types were placed in this encounter.  No orders of the defined types were placed in this encounter.     Procedures: No procedures performed   Clinical Data: No additional findings.   Subjective: Chief Complaint  Patient presents with   Lower Back - Follow-up    11/15/19 Left L4-5 TLIF    HPI  Review of Systems   Objective: Vital Signs: BP (!) 168/82   Pulse (!) 54   Ht 5\' 3"  (1.6 m)   Wt 165 lb (74.8 kg)   BMI 29.23 kg/m   Physical Exam  Ortho Exam  Specialty Comments:  No specialty comments available.  Imaging: No results found.   PMFS History: Patient Active Problem List   Diagnosis Date Noted   Spondylolisthesis, lumbar region 11/15/2019    Priority: High    Class: Chronic   Spinal stenosis, lumbar region with neurogenic claudication 11/15/2019    Priority: High    Class: Chronic   S/P lumbar spinal fusion 11/15/2019   Abdominal pain 02/23/2018   Fever 02/23/2018   Generalized body aches 02/23/2018   Viral illness 02/23/2018   Acute bronchitis 02/26/2017   Past Medical History:  Diagnosis Date    Allergy    Arthritis    Hypertension     History reviewed. No pertinent family history.  Past Surgical History:  Procedure Laterality Date   CARPAL TUNNEL RELEASE Right 06/21/2013   Procedure: RIGHT CARPAL TUNNEL RELEASE;  Surgeon: 14/07/2012, MD;  Location: [facility];  Service: Orthopedics;  Laterality: Right;   NO PAST SURGERIES     Social History   Occupational History   Not on file  Tobacco Use   Smoking status: Never   Smokeless tobacco: Never  Vaping Use   Vaping Use: Never used  Substance and Sexual Activity   Alcohol use: No   Drug use: No   Sexual activity: Yes

## 2021-03-14 ENCOUNTER — Encounter: Payer: Self-pay | Admitting: Women's Health

## 2021-03-14 ENCOUNTER — Other Ambulatory Visit: Payer: Self-pay

## 2021-03-14 ENCOUNTER — Ambulatory Visit (INDEPENDENT_AMBULATORY_CARE_PROVIDER_SITE_OTHER): Payer: BC Managed Care – PPO | Admitting: Women's Health

## 2021-03-14 ENCOUNTER — Other Ambulatory Visit (HOSPITAL_COMMUNITY)
Admission: RE | Admit: 2021-03-14 | Discharge: 2021-03-14 | Disposition: A | Discharge status: Home / Self Care | Payer: BC Managed Care – PPO | Source: Ambulatory Visit | Attending: Women's Health | Admitting: Women's Health

## 2021-03-14 VITALS — BP 171/73 | HR 64 | Wt 167.0 lb

## 2021-03-14 DIAGNOSIS — Z01419 Encounter for gynecological examination (general) (routine) without abnormal findings: Secondary | ICD-10-CM | POA: Insufficient documentation

## 2021-03-14 DIAGNOSIS — I10 Essential (primary) hypertension: Secondary | ICD-10-CM

## 2021-03-14 NOTE — Patient Instructions (Addendum)
Preventive Care 59-59 Years Old, Female Preventive care refers to lifestyle choices and visits with your health care provider that can promote health and wellness. This includes: A yearly physical exam. This is also called an annual wellness visit. Regular dental and eye exams. Immunizations. Screening for certain conditions. Healthy lifestyle choices, such as: Eating a healthy diet. Getting regular exercise. Not using drugs or products that contain nicotine and tobacco. Limiting alcohol use. What can I expect for my preventive care visit? Physical exam Your health care provider will check your: Height and weight. These may be used to calculate your BMI (body mass index). BMI is a measurement that tells if you are at a healthy weight. Heart rate and blood pressure. Body temperature. Skin for abnormal spots. Counseling Your health care provider may ask you questions about your: Past medical problems. Family's medical history. Alcohol, tobacco, and drug use. Emotional well-being. Home life and relationship well-being. Sexual activity. Diet, exercise, and sleep habits. Work and work Statistician. Access to firearms. Method of birth control. Menstrual cycle. Pregnancy history. What immunizations do I need?  Vaccines are usually given at various ages, according to a schedule. Your health care provider will recommend vaccines for you based on your age, medicalhistory, and lifestyle or other factors, such as travel or where you work. What tests do I need? Blood tests Lipid and cholesterol levels. These may be checked every 5 years, or more often if you are over 59 years old. Hepatitis C test. Hepatitis B test. Screening Lung cancer screening. You may have this screening every year starting at age 59 if you have a 30-pack-year history of smoking and currently smoke or have quit within the past 15 years. Colorectal cancer screening. All adults should have this screening starting at  age 59 and continuing until age 59. Your health care provider may recommend screening at age 59 if you are at increased risk. You will have tests every 1-10 years, depending on your results and the type of screening test. Diabetes screening. This is done by checking your blood sugar (glucose) after you have not eaten for a while (fasting). You may have this done every 1-3 years. Mammogram. This may be done every 1-2 years. Talk with your health care provider about when you should start having regular mammograms. This may depend on whether you have a family history of breast cancer. BRCA-related cancer screening. This may be done if you have a family history of breast, ovarian, tubal, or peritoneal cancers. Pelvic exam and Pap test. This may be done every 3 years starting at age 59. Starting at age 59, this may be done every 5 years if you have a Pap test in combination with an HPV test. Other tests STD (sexually transmitted disease) testing, if you are at risk. Bone density scan. This is done to screen for osteoporosis. You may have this scan if you are at high risk for osteoporosis. Talk with your health care provider about your test results, treatment options,and if necessary, the need for more tests. Follow these instructions at home: Eating and drinking  Eat a diet that includes fresh fruits and vegetables, whole grains, lean protein, and low-fat dairy products. Take vitamin and mineral supplements as recommended by your health care provider. Do not drink alcohol if: Your health care provider tells you not to drink. You are pregnant, may be pregnant, or are planning to become pregnant. If you drink alcohol: Limit how much you have to 0-1 drink a day. Be aware  of how much alcohol is in your drink. In the [place] one drink equals one 12 oz bottle of beer (355 mL), one 5 oz glass of wine (148 mL), or one 1 oz glass of hard liquor (44 mL).  Lifestyle Take daily care of your teeth and  gums. Brush your teeth every morning and night with fluoride toothpaste. Floss one time each day. Stay active. Exercise for at least 30 minutes 5 or more days each week. Do not use any products that contain nicotine or tobacco, such as cigarettes, e-cigarettes, and chewing tobacco. If you need help quitting, ask your health care provider. Do not use drugs. If you are sexually active, practice safe sex. Use a condom or other form of protection to prevent STIs (sexually transmitted infections). If you do not wish to become pregnant, use a form of birth control. If you plan to become pregnant, see your health care provider for a prepregnancy visit. If told by your health care provider, take low-dose aspirin daily starting at age 59. Find healthy ways to cope with stress, such as: Meditation, yoga, or listening to music. Journaling. Talking to a trusted person. Spending time with friends and family. Safety Always wear your seat belt while driving or riding in a vehicle. Do not drive: If you have been drinking alcohol. Do not ride with someone who has been drinking. When you are tired or distracted. While texting. Wear a helmet and other protective equipment during sports activities. If you have firearms in your house, make sure you follow all gun safety procedures. What's next? Visit your health care provider once a year for an annual wellness visit. Ask your health care provider how often you should have your eyes and teeth checked. Stay up to date on all vaccines. This information is not intended to replace advice given to you by your health care provider. Make sure you discuss any questions you have with your healthcare provider. Document Revised: 04/11/2020 Document Reviewed: 03/19/2018 Elsevier Patient Education  2022 Elsevier Inc.   Breast Self-Awareness Breast self-awareness means being familiar with how your breasts look and feel. It involves checking your breasts regularly and  reporting any changes to yourhealth care provider. Practicing breast self-awareness is important. Sometimes changes may not be harmful (are benign), but sometimes a change in your breasts can be a sign of a serious medical problem. It is important to learn how to do this procedure correctly so that you can catch problems early, when treatment is more likely to be successful. All women should practice breast self-awareness, including women who have hadbreast implants. What you need: A mirror. A well-lit room. How to do a breast self-exam A breast self-exam is one way to learn what is normal for your breasts andwhether your breasts are changing. To do a breast self-exam: Look for changes  Remove all the clothing above your waist. Stand in front of a mirror in a room with good lighting. Put your hands on your hips. Push your hands firmly downward. Compare your breasts in the mirror. Look for differences between them (asymmetry), such as: Differences in shape. Differences in size. Puckers, dips, and bumps in one breast and not the other. Look at each breast for changes in the skin, such as: Redness. Scaly areas. Look for changes in your nipples, such as: Discharge. Bleeding. Dimpling. Redness. A change in position.  Feel for changes Carefully feel your breasts for lumps and changes. It is best to do this while lying on your back   lying on your back on the floor, and again while sitting or standing in the tub or shower with soapy water on your skin. Feel each breast in the following way: Place the arm on the side of the breast you are examining above your head. Feel your breast with the other hand. Start in the nipple area and make -inch (2 cm) overlapping circles to feel your breast. Use the pads of your three middle fingers to do this. Apply light pressure, then medium pressure, then firm pressure. The light pressure will allow you to feel the tissue closest to the skin. The medium pressure will allow  you to feel the tissue that is a little deeper. The firm pressure will allow you to feel the tissue close to the ribs. Continue the overlapping circles, moving downward over the breast until you feel your ribs below your breast. Move one finger-width toward the center of the body. Continue to use the -inch (2 cm) overlapping circles to feel your breast as you move slowly up toward your collarbone. Continue the up-and-down exam using all three pressures until you reach your armpit.  Write down what you find Writing down what you find can help you remember what to discuss with your health care provider. Write down: What is normal for each breast. Any changes that you find in each breast, including: The kind of changes you find. Any pain or tenderness. Size and location of any lumps. Where you are in your menstrual cycle, if you are still menstruating. General tips and recommendations Examine your breasts every month. If you are breastfeeding, the best time to examine your breasts is after a feeding or after using a breast pump. If you menstruate, the best time to examine your breasts is 5-7 days after your period. Breasts are generally lumpier during menstrual periods, and it may be more difficult to notice changes. With time and practice, you will become more familiar with the variations in your breasts and more comfortable with the exam. Contact a health care provider if you: See a change in the shape or size of your breasts or nipples. See a change in the skin of your breast or nipples, such as a reddened or scaly area. Have unusual discharge from your nipples. Find a lump or thick area that was not there before. Have pain in your breasts. Have any concerns related to your breast health. Summary Breast self-awareness includes looking for physical changes in your breasts, as well as feeling for any changes within your breasts. Breast self-awareness should be performed in front of a mirror  in a well-lit room. You should examine your breasts every month. If you menstruate, the best time to examine your breasts is 5-7 days after your menstrual period. Let your health care provider know of any changes you notice in your breasts, including changes in size, changes on the skin, pain or tenderness, or unusual fluid from your nipples. This information is not intended to replace advice given to you by your health care provider. Make sure you discuss any questions you have with your healthcare provider. Document Revised: 02/24/2018 Document Reviewed: 02/24/2018 Elsevier Patient Education  2022 [place] Your Hypertension Hypertension, also called high blood pressure, is when the force of the blood pressing against the walls of the arteries is too strong. Arteries are blood vessels that carry blood from your heart throughout your body. Hypertension forces the heart to work harder to pump blood and  may cause the arteries tobecome narrow or stiff. Understanding blood pressure readings Your personal target blood pressure may vary depending on your medical conditions, your age, and other factors. A blood pressure reading includes a higher number over a lower number. Ideally, your blood pressure should be below 120/80. You should know that: The first, or top, number is called the systolic pressure. It is a measure of the pressure in your arteries as your heart beats. The second, or bottom number, is called the diastolic pressure. It is a measure of the pressure in your arteries as the heart relaxes. Blood pressure is classified into four stages. Based on your blood pressure reading, your health care provider may use the following stages to determine what type of treatment you need, if any. Systolic pressure and diastolicpressure are measured in a unit called mmHg. Normal Systolic pressure: below 737. Diastolic pressure: below 80. Elevated Systolic pressure:  106-269. Diastolic pressure: below 80. Hypertension stage 1 Systolic pressure: 485-462. Diastolic pressure: 70-35. Hypertension stage 2 Systolic pressure: 009 or above. Diastolic pressure: 90 or above. How can this condition affect me? Managing your hypertension is an important responsibility. Over time, hypertension can damage the arteries and decrease blood flow to important parts of the body, including the brain, heart, and kidneys. Having untreated or uncontrolled hypertension can lead to: A heart attack. A stroke. A weakened blood vessel (aneurysm). Heart failure. Kidney damage. Eye damage. Metabolic syndrome. Memory and concentration problems. Vascular dementia. What actions can I take to manage this condition? Hypertension can be managed by making lifestyle changes and possibly by taking medicines. Your health care provider will help you make a plan to bring yourblood pressure within a normal range. Nutrition  Eat a diet that is high in fiber and potassium, and low in salt (sodium), added sugar, and fat. An example eating plan is called the Dietary Approaches to Stop Hypertension (DASH) diet. To eat this way: Eat plenty of fresh fruits and vegetables. Try to fill one-half of your plate at each meal with fruits and vegetables. Eat whole grains, such as whole-wheat pasta, brown rice, or whole-grain bread. Fill about one-fourth of your plate with whole grains. Eat low-fat dairy products. Avoid fatty cuts of meat, processed or cured meats, and poultry with skin. Fill about one-fourth of your plate with lean proteins such as fish, chicken without skin, beans, eggs, and tofu. Avoid pre-made and processed foods. These tend to be higher in sodium, added sugar, and fat. Reduce your daily sodium intake. Most people with hypertension should eat less than 1,500 mg of sodium a day.  Lifestyle  Work with your health care provider to maintain a healthy body weight or to lose weight. Ask what  an ideal weight is for you. Get at least 30 minutes of exercise that causes your heart to beat faster (aerobic exercise) most days of the week. Activities may include walking, swimming, or biking. Include exercise to strengthen your muscles (resistance exercise), such as weight lifting, as part of your weekly exercise routine. Try to do these types of exercises for 30 minutes at least 3 days a week. Do not use any products that contain nicotine or tobacco, such as cigarettes, e-cigarettes, and chewing tobacco. If you need help quitting, ask your health care provider. Control any long-term (chronic) conditions you have, such as high cholesterol or diabetes. Identify your sources of stress and find ways to manage stress. This may include meditation, deep breathing, or making time for fun activities.  Alcohol  use Do not drink alcohol if: Your health care provider tells you not to drink. You are pregnant, may be pregnant, or are planning to become pregnant. If you drink alcohol: Limit how much you use to: 0-1 drink a day for women. 0-2 drinks a day for men. Be aware of how much alcohol is in your drink. In the [place] one drink equals one 12 oz bottle of beer (355 mL), one 5 oz glass of wine (148 mL), or one 1 oz glass of hard liquor (44 mL). Medicines Your health care provider may prescribe medicine if lifestyle changes are not enough to get your blood pressure under control and if: Your systolic blood pressure is 130 or higher. Your diastolic blood pressure is 80 or higher. Take medicines only as told by your health care provider. Follow the directions carefully. Blood pressure medicines must be taken as told by your health care provider. The medicine does not work as well when you skip doses. Skippingdoses also puts you at risk for problems. Monitoring Before you monitor your blood pressure: Do not smoke, drink caffeinated beverages, or exercise within 30 minutes before taking a  measurement. Use the bathroom and empty your bladder (urinate). Sit quietly for at least 5 minutes before taking measurements. Monitor your blood pressure at home as told by your health care provider. To do this: Sit with your back straight and supported. Place your feet flat on the floor. Do not cross your legs. Support your arm on a flat surface, such as a table. Make sure your upper arm is at heart level. Each time you measure, take two or three readings one minute apart and record the results. You may also need to have your blood pressure checked regularly by your healthcare provider. General information Talk with your health care provider about your diet, exercise habits, and other lifestyle factors that may be contributing to hypertension. Review all the medicines you take with your health care provider because there may be side effects or interactions. Keep all visits as told by your health care provider. Your health care provider can help you create and adjust your plan for managing your high blood pressure. Where to find more information National Heart, Lung, and Blood Institute: https://wilson-eaton.com/ American Heart Association: www.heart.org Contact a health care provider if: You think you are having a reaction to medicines you have taken. You have repeated (recurrent) headaches. You feel dizzy. You have swelling in your ankles. You have trouble with your vision. Get help right away if: You develop a severe headache or confusion. You have unusual weakness or numbness, or you feel faint. You have severe pain in your chest or abdomen. You vomit repeatedly. You have trouble breathing. These symptoms may represent a serious problem that is an emergency. Do not wait to see if the symptoms will go away. Get medical help right away. Call your local emergency services (911 in the [place] Do not drive yourself to the hospital. Summary Hypertension is when the force of blood pumping through  your arteries is too strong. If this condition is not controlled, it may put you at risk for serious complications. Your personal target blood pressure may vary depending on your medical conditions, your age, and other factors. For most people, a normal blood pressure is less than 120/80. Hypertension is managed by lifestyle changes, medicines, or both. Lifestyle changes to help manage hypertension include losing weight, eating a healthy, low-sodium diet, exercising more, stopping smoking, and limiting alcohol. This information is  not intended to replace advice given to you by your health care provider. Make sure you discuss any questions you have with your healthcare provider. Document Revised: 08/13/2019 Document Reviewed: 06/08/2019 Elsevier Patient Education  2022 [employer].

## 2021-03-14 NOTE — Progress Notes (Signed)
GYNECOLOGY ANNUAL PREVENTATIVE CARE ENCOUNTER NOTE  History:     Shannon Williamson is a 60 y.o. G3P0 female here for a routine annual gynecologic exam.  Current complaints: none.   Denies abnormal vaginal bleeding, discharge, pelvic pain, problems with intercourse or other gynecologic concerns. Pt declines STD testing today. Pt reports she does not perform SBE. Pt denies bowel or bladder concerns. No family hx of breast, colon, endometrial or ovarian cancer. Pt does not smoke, drink or use drugs.     Gynecologic History No LMP recorded. Patient is postmenopausal. Last Pap: 05/2017. Results were: normal per patient. Pt denies history of abnormal Pap. Last mammogram: 06/2020. Results were: normal Last colonoscopy: 9 years ago. Results were: normal Postmenopausal bleeding? no  Obstetric History OB History  Gravida Para Term Preterm AB Living  4         4  SAB IAB Ectopic Multiple Live Births          4    # Outcome Date GA Lbr Len/2nd Weight Sex Delivery Anes PTL Lv  4 Gravida      Vag-Spont   LIV  3 Gravida      Vag-Spont   LIV  2 Gravida      Vag-Spont   LIV  1 Gravida      Vag-Spont   LIV    Past Medical History:  Diagnosis Date   Allergy    Arthritis    Hypertension     Past Surgical History:  Procedure Laterality Date   CARPAL TUNNEL RELEASE Right 06/21/2013   Procedure: RIGHT CARPAL TUNNEL RELEASE;  Surgeon: Nicki Reaper, MD;  Location:  [facility];  Service: Orthopedics;  Laterality: Right;   NO PAST SURGERIES      Current Outpatient Medications on File Prior to Visit  Medication Sig Dispense Refill   gabapentin (NEURONTIN) 300 MG capsule Take 1 capsule (300 mg total) by mouth at bedtime. 90 capsule 3   hydrochlorothiazide (MICROZIDE) 12.5 MG capsule Take 12.5 mg by mouth daily.     lisinopril (PRINIVIL,ZESTRIL) 20 MG tablet Take 20 mg by mouth 2 (two) times daily.     meloxicam (MOBIC) 15 MG tablet Take 1 tablet (15 mg total) by mouth daily.  90 tablet 2   metoprolol succinate (TOPROL-XL) 100 MG 24 hr tablet Take 100 mg by mouth daily. Take with or immediately following a meal.     Multiple Vitamins-Minerals (MULTIVITAMIN WITH MINERALS) tablet Take 1 tablet by mouth daily. Woman     loratadine (CLARITIN) 10 MG tablet Take 10 mg by mouth daily as needed for allergies. (Patient not taking: Reported on 03/14/2021)     naproxen (NAPROSYN) 500 MG tablet Take 1 tablet (500 mg total) by mouth 2 (two) times daily with a meal. (Patient not taking: Reported on 03/14/2021) 40 tablet 2   tiZANidine (ZANAFLEX) 4 MG tablet Take 1 tablet (4 mg total) by mouth every 6 (six) hours as needed for muscle spasms. (Patient not taking: Reported on 03/14/2021) 30 tablet 0   No current facility-administered medications on file prior to visit.    No Known Allergies  Social History:  reports that she has never smoked. She has never used smokeless tobacco. She reports that she does not drink alcohol and does not use drugs.  No family history on file.  The following portions of the patient's history were reviewed and updated as appropriate: allergies, current medications, past family history, past medical history, past social history, past  surgical history and problem list.  Review of Systems Pertinent items noted in HPI and remainder of comprehensive ROS otherwise negative.  Physical Exam:  BP (!) 171/73   Pulse 64   Wt 167 lb (75.8 kg)   BMI 29.58 kg/m   CONSTITUTIONAL: Well-developed, well-nourished female in no acute distress.  HENT:  Normocephalic, atraumatic, External right and left ear normal. EYES: Conjunctivae and EOM are normal. Pupils are equal, round, and reactive to light. No scleral icterus.  NECK: Normal range of motion, supple, no masses.  Normal thyroid.  SKIN: Skin is warm and dry. No rash noted. Not diaphoretic. No erythema. No pallor. MUSCULOSKELETAL: Normal range of motion. No tenderness.  No cyanosis, clubbing, or  edema. NEUROLOGIC: Alert and oriented to person, place, and time. Normal reflexes, muscle tone coordination. PSYCHIATRIC: Normal mood and affect. Normal behavior. Normal judgment and thought content. CARDIOVASCULAR: Normal heart rate noted, regular rhythm. RESPIRATORY: Clear to auscultation bilaterally. Effort and breath sounds normal, no problems with respiration noted. BREASTS: pt declines ABDOMEN: Soft, normal bowel sounds, no distention noted.  No tenderness, rebound or guarding.  PELVIC: Normal appearing external genitalia; normal appearing vaginal mucosa and cervix.  No abnormal discharge noted.  Pap smear obtained.   Assessment and Plan:     1. Well woman exam - Cytology - PAP( [place])  2. Hypertension, unspecified type - Ambulatory referral to Family Practice BP today 171/73, pt advised to f/u with PCP, discussed warning signs of hypertensive urgency/emergency that warrant immediate attention at an emergency department. Pt verbalizes understanding. Pt reports she does take BP medication, and has taken it this morning and BP is normal at home as of this morning. Pt does not know when her next PCP appt is, BP meds are historical, referral made to PCP.   Will follow up results of pap smear and other testing, if performed, and manage accordingly.  Routine preventative health maintenance measures emphasized. Self-breast awareness taught, importance discussed, advised when to RTC, SBA literature given. Please refer to After Visit Summary for other counseling recommendations.      Gearldine Shown, [facility] Nurse Practitioner, [facility] for [employer], [facility]

## 2021-03-14 NOTE — Progress Notes (Signed)
New Patient is in the office for annual. Last pap 05-28-2017 Last mammogram 07-19-2020 Postmenopausal Pt denies any issues today.

## 2021-03-16 LAB — CYTOLOGY - PAP
Comment: NEGATIVE
Diagnosis: NEGATIVE
High risk HPV: NEGATIVE

## 2021-03-20 NOTE — Progress Notes (Signed)
Please call patient to let her know that Pap results were normal. Patient will need [name]. Please also confirm that patient was called for appointment by primary care for elevated BP.  Thank you, Joni Reining

## 2021-03-21 NOTE — Progress Notes (Signed)
TC to patient using [name]. No answer. Interpreter left message for patient to call the office.

## 2021-03-28 ENCOUNTER — Other Ambulatory Visit: Payer: Self-pay | Admitting: Internal Medicine

## 2021-03-28 DIAGNOSIS — M48062 Spinal stenosis, lumbar region with neurogenic claudication: Secondary | ICD-10-CM | POA: Diagnosis not present

## 2021-03-28 DIAGNOSIS — I10 Essential (primary) hypertension: Secondary | ICD-10-CM | POA: Diagnosis not present

## 2021-03-28 DIAGNOSIS — R7303 Prediabetes: Secondary | ICD-10-CM | POA: Diagnosis not present

## 2021-03-28 DIAGNOSIS — R42 Dizziness and giddiness: Secondary | ICD-10-CM | POA: Diagnosis not present

## 2021-03-29 LAB — CBC
HCT: 38 % (ref 35.0–45.0)
Hemoglobin: 13.1 g/dL (ref 11.7–15.5)
MCH: 30 pg (ref 27.0–33.0)
MCHC: 34.5 g/dL (ref 32.0–36.0)
MCV: 87.2 fL (ref 80.0–100.0)
MPV: 11.1 fL (ref 7.5–12.5)
Platelets: 281 10*3/uL (ref 140–400)
RBC: 4.36 10*6/uL (ref 3.80–5.10)
RDW: 13 % (ref 11.0–15.0)
WBC: 7.1 10*3/uL (ref 3.8–10.8)

## 2021-03-29 LAB — BASIC METABOLIC PANEL WITH GFR
BUN: 16 mg/dL (ref 7–25)
CO2: 25 mmol/L (ref 20–32)
Calcium: 9.6 mg/dL (ref 8.6–10.4)
Chloride: 100 mmol/L (ref 98–110)
Creat: 1.01 mg/dL (ref 0.50–1.03)
Glucose, Bld: 148 mg/dL — ABNORMAL HIGH (ref 65–99)
Potassium: 4.1 mmol/L (ref 3.5–5.3)
Sodium: 138 mmol/L (ref 135–146)
eGFR: 64 mL/min/{1.73_m2} (ref >=60)

## 2021-04-12 DIAGNOSIS — M19011 Primary osteoarthritis, right shoulder: Secondary | ICD-10-CM | POA: Diagnosis not present

## 2021-04-12 DIAGNOSIS — I10 Essential (primary) hypertension: Secondary | ICD-10-CM | POA: Diagnosis not present

## 2021-04-12 DIAGNOSIS — M48062 Spinal stenosis, lumbar region with neurogenic claudication: Secondary | ICD-10-CM | POA: Diagnosis not present

## 2021-05-24 DIAGNOSIS — I10 Essential (primary) hypertension: Secondary | ICD-10-CM | POA: Diagnosis not present

## 2021-05-24 DIAGNOSIS — R7303 Prediabetes: Secondary | ICD-10-CM | POA: Diagnosis not present

## 2021-05-24 DIAGNOSIS — Z23 Encounter for immunization: Secondary | ICD-10-CM | POA: Diagnosis not present

## 2021-05-24 DIAGNOSIS — M19011 Primary osteoarthritis, right shoulder: Secondary | ICD-10-CM | POA: Diagnosis not present

## 2021-07-26 ENCOUNTER — Other Ambulatory Visit: Payer: Self-pay | Admitting: Internal Medicine

## 2021-07-26 DIAGNOSIS — M48062 Spinal stenosis, lumbar region with neurogenic claudication: Secondary | ICD-10-CM | POA: Diagnosis not present

## 2021-07-26 DIAGNOSIS — E7849 Other hyperlipidemia: Secondary | ICD-10-CM | POA: Diagnosis not present

## 2021-07-26 DIAGNOSIS — R7303 Prediabetes: Secondary | ICD-10-CM | POA: Diagnosis not present

## 2021-07-26 DIAGNOSIS — E559 Vitamin D deficiency, unspecified: Secondary | ICD-10-CM | POA: Diagnosis not present

## 2021-07-26 DIAGNOSIS — M19011 Primary osteoarthritis, right shoulder: Secondary | ICD-10-CM | POA: Diagnosis not present

## 2021-07-26 DIAGNOSIS — Z0000 Encounter for general adult medical examination without abnormal findings: Secondary | ICD-10-CM | POA: Diagnosis not present

## 2021-07-27 ENCOUNTER — Other Ambulatory Visit: Payer: Self-pay | Admitting: Internal Medicine

## 2021-07-27 DIAGNOSIS — Z1231 Encounter for screening mammogram for malignant neoplasm of breast: Secondary | ICD-10-CM

## 2021-07-27 LAB — CBC
HCT: 36 % (ref 35.0–45.0)
Hemoglobin: 12.3 g/dL (ref 11.7–15.5)
MCH: 30.4 pg (ref 27.0–33.0)
MCHC: 34.2 g/dL (ref 32.0–36.0)
MCV: 88.9 fL (ref 80.0–100.0)
MPV: 12.7 fL — ABNORMAL HIGH (ref 7.5–12.5)
Platelets: 244 10*3/uL (ref 140–400)
RBC: 4.05 10*6/uL (ref 3.80–5.10)
RDW: 13 % (ref 11.0–15.0)
WBC: 6.9 10*3/uL (ref 3.8–10.8)

## 2021-07-27 LAB — COMPLETE METABOLIC PANEL WITH GFR
AG Ratio: 1.4 (calc) (ref 1.0–2.5)
ALT: 31 U/L — ABNORMAL HIGH (ref 6–29)
AST: 36 U/L — ABNORMAL HIGH (ref 10–35)
Albumin: 4.3 g/dL (ref 3.6–5.1)
Alkaline phosphatase (APISO): 75 U/L (ref 37–153)
BUN/Creatinine Ratio: 19 (calc) (ref 6–22)
BUN: 22 mg/dL (ref 7–25)
CO2: 26 mmol/L (ref 20–32)
Calcium: 9.8 mg/dL (ref 8.6–10.4)
Chloride: 103 mmol/L (ref 98–110)
Creat: 1.14 mg/dL — ABNORMAL HIGH (ref 0.50–1.03)
Globulin: 3.1 g/dL (calc) (ref 1.9–3.7)
Glucose, Bld: 78 mg/dL (ref 65–99)
Potassium: 3.9 mmol/L (ref 3.5–5.3)
Sodium: 139 mmol/L (ref 135–146)
Total Bilirubin: 0.5 mg/dL (ref 0.2–1.2)
Total Protein: 7.4 g/dL (ref 6.1–8.1)
eGFR: 55 mL/min/{1.73_m2} — ABNORMAL LOW (ref >=60)

## 2021-07-27 LAB — LIPID PANEL
Cholesterol: 191 mg/dL (ref <200)
HDL: 43 mg/dL — ABNORMAL LOW (ref >=50)
LDL Cholesterol (Calc): 105 mg/dL (calc) — ABNORMAL HIGH
Non-HDL Cholesterol (Calc): 148 mg/dL (calc) — ABNORMAL HIGH (ref <130)
Total CHOL/HDL Ratio: 4.4 (calc) (ref <5.0)
Triglycerides: 326 mg/dL — ABNORMAL HIGH (ref <150)

## 2021-07-27 LAB — TSH: TSH: 1.73 mIU/L (ref 0.40–4.50)

## 2021-07-27 LAB — VITAMIN D 25 HYDROXY (VIT D DEFICIENCY, FRACTURES): Vit D, 25-Hydroxy: 36 ng/mL (ref 30–100)

## 2021-07-31 ENCOUNTER — Ambulatory Visit
Admission: RE | Admit: 2021-07-31 | Discharge: 2021-07-31 | Disposition: A | Discharge status: Home / Self Care | Payer: BC Managed Care – PPO | Source: Ambulatory Visit | Attending: Internal Medicine | Admitting: Internal Medicine

## 2021-07-31 DIAGNOSIS — Z1231 Encounter for screening mammogram for malignant neoplasm of breast: Secondary | ICD-10-CM | POA: Diagnosis not present

## 2021-09-10 ENCOUNTER — Other Ambulatory Visit: Payer: Self-pay

## 2021-09-10 ENCOUNTER — Ambulatory Visit: Payer: Self-pay

## 2021-09-10 ENCOUNTER — Encounter: Payer: Self-pay | Admitting: Specialist

## 2021-09-10 ENCOUNTER — Ambulatory Visit (INDEPENDENT_AMBULATORY_CARE_PROVIDER_SITE_OTHER): Payer: BC Managed Care – PPO | Admitting: Specialist

## 2021-09-10 VITALS — BP 145/72 | HR 58 | Ht 63.0 in | Wt 167.0 lb

## 2021-09-10 DIAGNOSIS — M7541 Impingement syndrome of right shoulder: Secondary | ICD-10-CM | POA: Diagnosis not present

## 2021-09-10 DIAGNOSIS — R29898 Other symptoms and signs involving the musculoskeletal system: Secondary | ICD-10-CM

## 2021-09-10 DIAGNOSIS — M25512 Pain in left shoulder: Secondary | ICD-10-CM

## 2021-09-10 DIAGNOSIS — Z981 Arthrodesis status: Secondary | ICD-10-CM | POA: Diagnosis not present

## 2021-09-10 DIAGNOSIS — M778 Other enthesopathies, not elsewhere classified: Secondary | ICD-10-CM | POA: Diagnosis not present

## 2021-09-10 DIAGNOSIS — M7542 Impingement syndrome of left shoulder: Secondary | ICD-10-CM

## 2021-09-10 MED ORDER — BUPIVACAINE HCL 0.5 % IJ SOLN
3.0000 mL | INTRAMUSCULAR | Status: AC | PRN
  Administered 2021-09-10: 3 mL via INTRA_ARTICULAR

## 2021-09-10 MED ORDER — METHYLPREDNISOLONE ACETATE 40 MG/ML IJ SUSP
40.0000 mg | INTRAMUSCULAR | Status: AC | PRN
  Administered 2021-09-10: 40 mg via INTRA_ARTICULAR

## 2021-09-10 NOTE — Progress Notes (Signed)
Office Visit Note   Patient: Shannon Williamson           Date of Birth: June 22, 1961           MRN: [id number] Visit Date: 09/10/2021              Requested by: Fleet Contras, MD [address] [place],  [place] [address] PCP: Fleet Contras, MD   Assessment & Plan: Visit Diagnoses:  1. S/P lumbar fusion   2. Acute pain of left shoulder   3. Impingement syndrome of right shoulder   4. Right shoulder tendonitis   5. Impingement syndrome of shoulder, left   6. Bilateral leg weakness     Plan: Avoid frequent bending and stooping  No lifting greater than 10 lbs. May use ice or moist heat for pain. Weight loss is of benefit. Best medication for lumbar disc disease is arthritis medications like motrin, celebrex and naprosyn. Exercise is important to improve your indurance and does allow people to function better inspite of back pain.  Avoid overhead lifting and overhead use of the arms. Pillows to keep from sleeping directly on the shoulders Limited lifting to less than 10 lbs. Ice or heat for relief. NSAIDs are helpful, such as alleve or motrin, be careful not to use in excess as they place burdens on the kidney. Stretching exercise help and strengthening is helpful to build endurance.  I am referring her for physical therapy for both the left shoulder and the lumbar spine for building her endurance  Up and strengthening her legs.   Follow-Up Instructions: Return in about 6 weeks (around 10/22/2021).   Orders:  Orders Placed This Encounter  Procedures   XR Lumbar Spine 2-3 Views   XR Shoulder Left   Ambulatory referral to Physical Therapy   No orders of the defined types were placed in this encounter.     Procedures: Large Joint Inj: L subacromial bursa on 09/10/2021 4:50 PM Indications: pain Details: 25 G 1.5 in needle, anterolateral approach  Arthrogram: No  Medications: 40 mg methylPREDNISolone acetate 40 MG/ML; 3 mL bupivacaine 0.5 % Outcome: tolerated well,  no immediate complications  Bandaid applied.  Procedure, treatment alternatives, risks and benefits explained, specific risks discussed. Consent was given by the patient. Immediately prior to procedure a time out was called to verify the correct patient, procedure, equipment, support staff and site/side marked as required. Patient was prepped and draped in the usual sterile fashion.     Clinical Data: No additional findings.   Subjective: Chief Complaint  Patient presents with   Lower Back - Follow-up    11/15/2019 left  L4-68 TLIF    61 year old female now nearly 1 year and 10 months post op lumbar TLIF L4-5 for spondylolisthesis with spinal stenosis. She went on to fusion of the TLIF but returns today complaining of persistent weakness in both legs and difficulty with prolong standing and walking. No bowel or bladder difficulty. She is feeling like her legs may give away. She is not having much back discomfort. Also complains of left shoulder pain and difficulty using the left arm over head. Pain with sleeping on the left shoulder.    Review of Systems  Constitutional: Negative.   HENT: Negative.    Eyes: Negative.   Respiratory: Negative.    Cardiovascular: Negative.   Gastrointestinal: Negative.   Endocrine: Negative.   Genitourinary: Negative.   Musculoskeletal: Negative.   Skin: Negative.   Allergic/Immunologic: Negative.   Neurological: Negative.  Hematological: Negative.   Psychiatric/Behavioral: Negative.      Objective: Vital Signs: BP (!) 145/72    Pulse (!) 58    Ht 5\' 3"  (1.6 m)    Wt 167 lb (75.8 kg)    BMI 29.58 kg/m   Physical Exam Constitutional:      Appearance: She is well-developed.  HENT:     Head: Normocephalic and atraumatic.  Eyes:     Pupils: Pupils are equal, round, and reactive to light.  Pulmonary:     Effort: Pulmonary effort is normal.     Breath sounds: Normal breath sounds.  Abdominal:     General: Bowel sounds are normal.      Palpations: Abdomen is soft.  Musculoskeletal:     Cervical back: Normal range of motion and neck supple.     Lumbar back: Negative right straight leg raise test and negative left straight leg raise test.  Skin:    General: Skin is warm and dry.  Neurological:     Mental Status: She is alert and oriented to person, place, and time.  Psychiatric:        Behavior: Behavior normal.        Thought Content: Thought content normal.        Judgment: Judgment normal.    Back Exam   Tenderness  The patient is experiencing tenderness in the lumbar.  Range of Motion  Extension:  abnormal  Flexion:  abnormal  Lateral bend right:  abnormal  Lateral bend left:  abnormal  Rotation right:  abnormal  Rotation left:  abnormal   Muscle Strength  Right Quadriceps:  5/5  Left Quadriceps:  5/5  Right Hamstrings:  5/5   Tests  Straight leg raise right: negative Straight leg raise left: negative  Reflexes  Patellar:  0/4 Achilles:  0/4  Other  Toe walk: normal Heel walk: normal  Comments:  SLR is negative, hip and knee ROM is not painful. Weak in both foot DF and PF but is able to heel and toe stand and walk.    Left Shoulder Exam   Tenderness  The patient is experiencing tenderness in the acromion.  Range of Motion  Active abduction:  140  Passive abduction:  150 abnormal  Extension:  normal  External rotation:  80 abnormal  Forward flexion:  normal  Internal rotation 0 degrees:  normal   Muscle Strength  Abduction: 4/5  External rotation: 4/5  Supraspinatus: 4/5  Subscapularis: 5/5  Biceps: 5/5   Tests  Apprehension: negative Hawkins test: negative Cross arm: negative Impingement: positive Drop arm: negative Sulcus: absent  Other  Erythema: absent Scars: absent Pulse: present   Comments:  Painful ROM left shoulder.     Specialty Comments:  No specialty comments available.  Imaging: XR Lumbar Spine 2-3 Views  Result Date: 09/10/2021 AP and lateral  flexion and extension radiographs of the lumbar spine demonstrate pedicle screws and rods L4-5 with interbody fusion with cage present. With flexion and extension there is less than 30mm of motion present at the L4-5 level suggesting that this level is fused succussfully. The disc height at the adjacent levels is well maintained.   XR Shoulder Left  Result Date: 09/10/2021 AP, lateral and outlet views of the left shoulder show narrowing of the subacromial space to 8 mm, no significant gleno humeral arthrosis or acromioclavicular arthrosis. Mild spur over the greater tuberosity. No acute change, no fracture of dislocation.     PMFS History: Patient Active Problem  List   Diagnosis Date Noted   Spondylolisthesis, lumbar region 11/15/2019    Priority: High    Class: Chronic   Spinal stenosis, lumbar region with neurogenic claudication 11/15/2019    Priority: High    Class: Chronic   S/P lumbar spinal fusion 11/15/2019   Abdominal pain 02/23/2018   Fever 02/23/2018   Generalized body aches 02/23/2018   Viral illness 02/23/2018   Acute bronchitis 02/26/2017   Past Medical History:  Diagnosis Date   Allergy    Arthritis    Hypertension     Family History  Problem Relation Age of Onset   Breast cancer Neg Hx     Past Surgical History:  Procedure Laterality Date   CARPAL TUNNEL RELEASE Right 06/21/2013   Procedure: RIGHT CARPAL TUNNEL RELEASE;  Surgeon: Nicki Reaper, MD;  Location: [facility];  Service: Orthopedics;  Laterality: Right;   NO PAST SURGERIES     Social History   Occupational History   Not on file  Tobacco Use   Smoking status: Never   Smokeless tobacco: Never  Vaping Use   Vaping Use: Never used  Substance and Sexual Activity   Alcohol use: No   Drug use: No   Sexual activity: Yes

## 2021-09-10 NOTE — Patient Instructions (Signed)
Avoid frequent bending and stooping  No lifting greater than 10 lbs. May use ice or moist heat for pain. Weight loss is of benefit. Best medication for lumbar disc disease is arthritis medications like motrin, celebrex and naprosyn. Exercise is important to improve your indurance and does allow people to function better inspite of back pain.  Avoid overhead lifting and overhead use of the arms. Pillows to keep from sleeping directly on the shoulders Limited lifting to less than 10 lbs. Ice or heat for relief. NSAIDs are helpful, such as alleve or motrin, be careful not to use in excess as they place burdens on the kidney. Stretching exercise help and strengthening is helpful to build endurance.  I am referring her for physical therapy for both the left shoulder and the lumbar spine for building her endurance  Up and strengthening her legs.

## 2021-10-17 ENCOUNTER — Encounter: Payer: Self-pay | Admitting: Physical Therapy

## 2021-10-17 ENCOUNTER — Ambulatory Visit: Payer: BC Managed Care – PPO | Attending: Specialist | Admitting: Physical Therapy

## 2021-10-17 DIAGNOSIS — R262 Difficulty in walking, not elsewhere classified: Secondary | ICD-10-CM

## 2021-10-17 DIAGNOSIS — M545 Low back pain, unspecified: Secondary | ICD-10-CM | POA: Diagnosis not present

## 2021-10-17 DIAGNOSIS — M6281 Muscle weakness (generalized): Secondary | ICD-10-CM

## 2021-10-17 DIAGNOSIS — R2689 Other abnormalities of gait and mobility: Secondary | ICD-10-CM

## 2021-10-17 DIAGNOSIS — M25512 Pain in left shoulder: Secondary | ICD-10-CM | POA: Diagnosis not present

## 2021-10-17 DIAGNOSIS — R600 Localized edema: Secondary | ICD-10-CM | POA: Diagnosis not present

## 2021-10-17 DIAGNOSIS — R29898 Other symptoms and signs involving the musculoskeletal system: Secondary | ICD-10-CM | POA: Diagnosis not present

## 2021-10-17 DIAGNOSIS — M7542 Impingement syndrome of left shoulder: Secondary | ICD-10-CM | POA: Diagnosis not present

## 2021-10-17 DIAGNOSIS — Z981 Arthrodesis status: Secondary | ICD-10-CM | POA: Insufficient documentation

## 2021-10-17 NOTE — Therapy (Signed)
[facility] ?[id number] ?[place], [place], [address] ?Phone: [phone]   Fax:  [phone] ? ?Physical Therapy Evaluation ? ?Patient Details  ?Name: Shannon Williamson ?MRN: [id number] ?Date of Birth: Mar 08, 1961 ?Referring Provider (PT): Vira BrownsNitka, James E ? ? ?Encounter Date: 10/17/2021 ? ? PT End of Session - 10/17/21 1638   ? ? Visit Number 1   ? Date for PT Re-Evaluation 12/26/21   ? PT Start Time 1547   ? PT Stop Time 1626   ? PT Time Calculation (min) 39 min   ? Activity Tolerance Patient tolerated treatment well;Patient limited by pain   ? Behavior [facility] for tasks assessed/performed   ? ?  ?  ? ?  ? ? ?Past Medical History:  ?Diagnosis Date  ? Allergy   ? Arthritis   ? Hypertension   ? ? ?Past Surgical History:  ?Procedure Laterality Date  ? CARPAL TUNNEL RELEASE Right 06/21/2013  ? Procedure: RIGHT CARPAL TUNNEL RELEASE;  Surgeon: Nicki ReaperGary R Kuzma, MD;  Location: [facility];  Service: Orthopedics;  Laterality: Right;  ? NO PAST SURGERIES    ? ? ?There were no vitals filed for this visit. ? ? ? Subjective Assessment - 10/17/21 1548   ? ? Subjective Patient has lumbar surgery 2 years ago, with residual weakness. She developed B shoulder pain, first in R shoulder, then in L, approximately 6 months ago. L pain is worse than R. Dr diagnosed shoulder impingement.   ? How long can you sit comfortably? N/A   ? How long can you stand comfortably? Standing still causes legs to feel like they will buckle   ? How long can you walk comfortably? No pain, but her weakness causes falls and near falls as her legs give out.   ? Diagnostic tests X ray of back (-), X ray of L shoulder shows narrowing of subacromial joint space.   ? Patient Stated Goals Increased leg strength, decresed shoulder pain.   ? Currently in Pain? No/denies   ? ?  ?  ? ?  ? ? ? ? ? [facility] PT Assessment - 10/17/21 0001   ? ?  ? Assessment  ? Medical Diagnosis L shoulder impingement,  BLE and trunk weakness   ? Referring Provider (PT) Kerrin ChampagneNitka, James E   ?  ? Precautions  ? Precaution Comments do not lift more than 10#.   ?  ? Balance Screen  ? Has the patient fallen in the past 6 months No   ?  ? Home Environment  ? Living Environment Private residence   ? Living Arrangements Spouse/significant other   ? Available Help at Discharge Family   ? Type of Home House   ? Home Access Stairs to enter   ? Entrance Stairs-Number of Steps 2   ? Entrance Stairs-Rails Right   ? Home Layout One level   ?  ? Prior Function  ? Level of Independence Independent   ?  ? Cognition  ? Overall Cognitive Status Within Functional Limits for tasks assessed   ?  ? Posture/Postural Control  ? Posture Comments rounded shoulders, flattened thoracic curve   ?  ? ROM / Strength  ? AROM / PROM / Strength AROM;Strength   ?  ? AROM  ? Overall AROM Comments B shoulder elevation to at least 130degrees, all other UE ROM WFL. B Hips tight in IR/ER, abd/add, hip flexion.   ? AROM Assessment Site Lumbar   ?  Lumbar Flexion to toes   ? Lumbar Extension mildly impaired,   ? Lumbar - Right Side Bend to knee   ? Lumbar - Left Side Bend to knee   ? Lumbar - Right Rotation moderately limited   ? Lumbar - Left Rotation moderately limited   ?  ? Strength  ? Overall Strength Comments B shoulder strength limited by pain, able to elevate to 90 degrees, but unable to take resistance on L due to pain. B knees and ankles test at least 4/5.   ? Strength Assessment Site Hip   ? Right/Left Hip Right;Left   ? Right Hip Flexion 4-/5   ? Right Hip Extension 3-/5   ? Right Hip External Rotation  3+/5   ? Right Hip Internal Rotation 3+/5   ? Right Hip ABduction 4-/5   ? Left Hip Flexion 4-/5   ? Left Hip Extension 3-/5   ? Left Hip External Rotation 3+/5   ? Left Hip Internal Rotation 3+/5   ? Left Hip ABduction 4-/5   ?  ? Flexibility  ? Soft Tissue Assessment /Muscle Length yes   ? Hamstrings 90 degree SLR B   ? Quadriceps Mod tight   ? ITB Mod tight   ?  Quadratus Lumborum Mod tight   ?  ? Palpation  ? Palpation comment Muscle tightness and TTP along B up traps, B pects, scalenes, B piriformis and lateral thighs, B low back just above iliac crests.   ?  ? Special Tests  ?  Special Tests Rotator Cuff Impingement   ? Rotator Cuff Impingment tests Leanord Asal test   ?  ? Hawkins-Kennedy test  ? Findings Positive   ? Side Left   ?  ? Ambulation/Gait  ? Gait Comments Patient walks with small steps, decreased rotation, unsteady, slow pace, antalgic throughout gait cycle.   ?  ? Balance  ? Balance Assessed Yes   ?  ? Standardized Balance Assessment  ? Standardized Balance Assessment Timed Up and Go Test;Five Times Sit to Stand   ? Five times sit to stand comments  33.85   ?  ? Timed Up and Go Test  ? Normal TUG (seconds) 25.25   ? TUG Comments Very slow gait   ? ?  ?  ? ?  ? ? ? ? ? ? ? ? ? ? ? ? ? ?Objective measurements completed on examination: See above findings.  ? ? ? ? ? ? ? ? ? ? ? ? ? ? PT Education - 10/17/21 1637   ? ? Education Details POC   ? Person(s) Educated Patient;Spouse   ? Methods Explanation   ? Comprehension Verbalized understanding   ? ?  ?  ? ?  ? ? ? PT Short Term Goals - 10/17/21 1648   ? ?  ? PT SHORT TERM GOAL #1  ? Title I with basic HEP   ? Time 4   ? Period Weeks   ? Status New   ? Target Date 11/14/21   ?  ? PT SHORT TERM GOAL #2  ? Title Patient will be able to perform L shoulder elevation to at least 90 degrees with pain < 4/10   ? Baseline 90 degrees iwth severe pain.   ? Time 4   ? Period Weeks   ? Status New   ? Target Date 11/14/21   ?  ? PT SHORT TERM GOAL #3  ? Title Patient will clear hips from table  while performing U bridge on solid surface.   ? Baseline Unable to lift either hip in U bridge.   ? Time 4   ? Period Weeks   ? Status New   ? Target Date 11/14/21   ? ?  ?  ? ?  ? ? ? ? PT Long Term Goals - 10/17/21 1653   ? ?  ? PT LONG TERM GOAL #1  ? Title I with final HEP   ? Time 10   ? Period Weeks   ? Status New   ? Target  Date 12/26/21   ?  ? PT LONG TERM GOAL #2  ? Title Patient will complete 5xSTS in <20 seconds to demosntrate improved BLE strength.   ? Baseline 33.85 sec   ? Time 10   ? Period Weeks   ? Status New   ? Target Date 12/26/21   ?  ? PT LONG TERM GOAL #3  ? Title Patient will complete TUG in < 15 seconds to demosntrate decreased fall risk.   ? Baseline 25.25   ? Time 10   ? Period Weeks   ? Status New   ? Target Date 12/26/21   ?  ? PT LONG TERM GOAL #4  ? Title Patient will ambulate at least 500' with LRAD, demonstrate functional speed, normalized pattern, no unsteadiness.   ? Baseline 59' with very slow pace, multiple deviations, unsteady.   ? Time 10   ? Period Weeks   ? Status New   ? Target Date 12/26/21   ?  ? PT LONG TERM GOAL #5  ? Title Patient will be able to perform her normal daily activities with <3/10 L shoulder pain.   ? Baseline Unable to elevate past 90 degrees, reports severe pain.   ? Time 10   ? Period Weeks   ? Status New   ? Target Date 12/26/21   ? ?  ?  ? ?  ? ? ? ? ? ? ? ? ? Plan - 10/17/21 1639   ? ? Clinical Impression Statement Patient presents S/P lumbar fusion 2 years ago. She continues to have residual leg weakness since surgery. In the past 6 months, she also started having R shoulder pain, then L shoulder pain. X ray shows L shoulder impingement, with tendonitis. She demosntrates functional weakness throughout her extremities and her trunk. Her balance is impaired as is her functional mobility and gait. She will benefit from PT to address her acute shoulder pain as well as to facilitate strengthening and stretching throughout her trunk, and facilitate functional movement re-education to improve her balance and decrease her fall risk.   ? Personal Factors and Comorbidities Fitness;Comorbidity 1   ? Comorbidities Lumbar fusion.   ? Examination-Activity Limitations Locomotion Level;Transfers;Reach Overhead;Bed Mobility;Carry;Squat;Dressing;Hygiene/Grooming;Lift;Stand   ?  Examination-Participation Restrictions Meal Prep;Cleaning;Laundry   ? Stability/Clinical Decision Making Evolving/Moderate complexity   ? Clinical Decision Making Moderate   ? Rehab Potential Good   ? PT Frequency 2x / wee

## 2021-10-22 ENCOUNTER — Encounter: Payer: Self-pay | Admitting: Specialist

## 2021-10-22 ENCOUNTER — Ambulatory Visit (INDEPENDENT_AMBULATORY_CARE_PROVIDER_SITE_OTHER): Payer: BC Managed Care – PPO | Admitting: Specialist

## 2021-10-22 VITALS — BP 153/76 | HR 58 | Ht 63.0 in | Wt 167.0 lb

## 2021-10-22 DIAGNOSIS — Z981 Arthrodesis status: Secondary | ICD-10-CM

## 2021-10-22 DIAGNOSIS — M25512 Pain in left shoulder: Secondary | ICD-10-CM

## 2021-10-22 DIAGNOSIS — R29898 Other symptoms and signs involving the musculoskeletal system: Secondary | ICD-10-CM

## 2021-10-22 DIAGNOSIS — M5416 Radiculopathy, lumbar region: Secondary | ICD-10-CM

## 2021-10-22 DIAGNOSIS — R200 Anesthesia of skin: Secondary | ICD-10-CM | POA: Diagnosis not present

## 2021-10-22 DIAGNOSIS — M778 Other enthesopathies, not elsewhere classified: Secondary | ICD-10-CM

## 2021-10-22 DIAGNOSIS — M7541 Impingement syndrome of right shoulder: Secondary | ICD-10-CM

## 2021-10-22 NOTE — Patient Instructions (Signed)
Plan: Avoid overhead lifting and overhead use of the arms. ?Do not lift greater than 5-10 lbs. ?Avoid frequent bending and stooping  ?No lifting greater than 10 lbs. ?May use ice or moist heat for pain. ?Weight loss is of benefit. ?Best medication for lumbar disc disease is arthritis medications like motrin, celebrex and naprosyn. ?Exercise is important to improve your indurance and does allow people to function better inspite of back pain. ? Continue with PT for the shoulders. ?If the pain in the back and the legs is still a problem then an MRI will be ordered.  ?

## 2021-10-22 NOTE — Progress Notes (Signed)
? ?Office Visit Note ?  ?Patient: Shannon Williamson           ?Date of Birth: Mar 23, 1961           ?MRN: [id number] ?Visit Date: 10/22/2021 ?             ?Requested by: Fleet Contras, MD ?[address] ?[place],  [place] [address] ?PCP: Fleet Contras, MD ? ? ?Assessment & Plan: ?Visit Diagnoses:  ?1. S/P lumbar fusion   ?2. Lumbar radiculopathy, chronic   ?3. Bilateral leg numbness   ?4. Bilateral leg weakness   ?5. Right shoulder tendonitis   ?6. Acute pain of left shoulder   ?7. Impingement syndrome of right shoulder   ? ? ?Plan: Avoid overhead lifting and overhead use of the arms. ?Do not lift greater than 5-10 lbs. ?Avoid frequent bending and stooping  ?No lifting greater than 10 lbs. ?May use ice or moist heat for pain. ?Weight loss is of benefit. ?Best medication for lumbar disc disease is arthritis medications like motrin, celebrex and naprosyn. ?Exercise is important to improve your indurance and does allow people to function better inspite of back pain. ? Continue with PT for the shoulders. ?If the pain in the back and the legs is still a problem then an MRI will be ordered.  ? ?Follow-Up Instructions: No follow-ups on file.  ? ?Orders:  ?No orders of the defined types were placed in this encounter. ? ?No orders of the defined types were placed in this encounter. ? ? ? ? Procedures: ?No procedures performed ? ? ?Clinical Data: ?No additional findings. ? ? ?Subjective: ?Chief Complaint  ?Patient presents with  ? Right Shoulder - Follow-up  ?  Doing good, injection helped  ? Lower Back - Follow-up  ?  Doing good  ? ? ?62 year old female with history of lumbar fusion 10/2019. This is doing well and she is able to stand and walk but does have difficulty with prolong standing and walking. She was seen with bilateral shoulder pain and underwent shoulder injection. Returns and is seeing PT and overall is making good improvement in her pain pattern. No bowel or bladder difficulty. Having manding pain into the legs  with stiffness and numbness into the right leg and left with numbness all the way down the leg.  ? ? ?Review of Systems  ?Constitutional: Negative.   ?HENT: Negative.    ?Eyes: Negative.   ?Respiratory: Negative.    ?Cardiovascular: Negative.   ?Gastrointestinal: Negative.   ?Endocrine: Negative.   ?Genitourinary: Negative.   ?Musculoskeletal: Negative.   ?Skin: Negative.   ?Allergic/Immunologic: Negative.   ?Neurological: Negative.   ?Hematological: Negative.   ?Psychiatric/Behavioral: Negative.    ? ? ?Objective: ?Vital Signs: BP (!) 153/76 (BP Location: Left Arm, Patient Position: Sitting)   Pulse (!) 58   Ht 5\' 3"  (1.6 m)   Wt 167 lb (75.8 kg)   BMI 29.58 kg/m?  ? ?Physical Exam ?Musculoskeletal:  ?   Lumbar back: Negative right straight leg raise test and negative left straight leg raise test.  ? ? ?Back Exam  ? ?Tenderness  ?The patient is experiencing tenderness in the lumbar. ? ?Range of Motion  ?Extension:  abnormal  ?Flexion:  abnormal  ?Lateral bend right:  abnormal  ?Lateral bend left:  abnormal  ?Rotation right:  abnormal  ?Rotation left:  abnormal  ? ?Muscle Strength  ?Right Quadriceps:  5/5  ?Left Quadriceps:  5/5  ?Right Hamstrings:  5/5  ?Left Hamstrings:  5/5  ? ?  Tests  ?Straight leg raise right: negative ?Straight leg raise left: negative ? ?Reflexes  ?Patellar:  0/4 ?Achilles:  0/4 ? ?Other  ?Heel walk: normal ?Sensation: decreased ?Gait: normal  ?Erythema: no back redness ?Scars: present ? ? ? ? ?Specialty Comments:  ?No specialty comments available. ? ?Imaging: ?No results found. ? ? ?PMFS History: ?Patient Active Problem List  ? Diagnosis Date Noted  ? Spondylolisthesis, lumbar region 11/15/2019  ?  Priority: High  ?  Class: Chronic  ? Spinal stenosis, lumbar region with neurogenic claudication 11/15/2019  ?  Priority: High  ?  Class: Chronic  ? S/P lumbar spinal fusion 11/15/2019  ? Abdominal pain 02/23/2018  ? Fever 02/23/2018  ? Generalized body aches 02/23/2018  ? Viral illness  02/23/2018  ? Acute bronchitis 02/26/2017  ? ?Past Medical History:  ?Diagnosis Date  ? Allergy   ? Arthritis   ? Hypertension   ?  ?Family History  ?Problem Relation Age of Onset  ? Breast cancer Neg Hx   ?  ?Past Surgical History:  ?Procedure Laterality Date  ? CARPAL TUNNEL RELEASE Right 06/21/2013  ? Procedure: RIGHT CARPAL TUNNEL RELEASE;  Surgeon: Nicki Reaper, MD;  Location: [facility];  Service: Orthopedics;  Laterality: Right;  ? NO PAST SURGERIES    ? ?Social History  ? ?Occupational History  ? Not on file  ?Tobacco Use  ? Smoking status: Never  ? Smokeless tobacco: Never  ?Vaping Use  ? Vaping Use: Never used  ?Substance and Sexual Activity  ? Alcohol use: No  ? Drug use: No  ? Sexual activity: Yes  ? ? ? ? ? ? ?

## 2021-10-23 ENCOUNTER — Other Ambulatory Visit: Payer: Self-pay

## 2021-10-23 ENCOUNTER — Emergency Department (HOSPITAL_COMMUNITY)
Admission: EM | Admit: 2021-10-23 | Discharge: 2021-10-23 | Disposition: A | Discharge status: Home / Self Care | Payer: BC Managed Care – PPO | Attending: Emergency Medicine | Admitting: Emergency Medicine

## 2021-10-23 ENCOUNTER — Encounter (HOSPITAL_COMMUNITY): Payer: Self-pay

## 2021-10-23 ENCOUNTER — Emergency Department (HOSPITAL_COMMUNITY): Payer: BC Managed Care – PPO

## 2021-10-23 DIAGNOSIS — R1310 Dysphagia, unspecified: Secondary | ICD-10-CM | POA: Diagnosis not present

## 2021-10-23 DIAGNOSIS — R0981 Nasal congestion: Secondary | ICD-10-CM | POA: Insufficient documentation

## 2021-10-23 DIAGNOSIS — R059 Cough, unspecified: Secondary | ICD-10-CM | POA: Insufficient documentation

## 2021-10-23 DIAGNOSIS — R0982 Postnasal drip: Secondary | ICD-10-CM | POA: Diagnosis not present

## 2021-10-23 DIAGNOSIS — Z20822 Contact with and (suspected) exposure to covid-19: Secondary | ICD-10-CM | POA: Insufficient documentation

## 2021-10-23 DIAGNOSIS — J029 Acute pharyngitis, unspecified: Secondary | ICD-10-CM | POA: Diagnosis not present

## 2021-10-23 DIAGNOSIS — R5383 Other fatigue: Secondary | ICD-10-CM | POA: Insufficient documentation

## 2021-10-23 DIAGNOSIS — I10 Essential (primary) hypertension: Secondary | ICD-10-CM | POA: Diagnosis not present

## 2021-10-23 LAB — GROUP A STREP BY PCR: Group A Strep by PCR: NOT DETECTED

## 2021-10-23 LAB — RESP PANEL BY RT-PCR (FLU A&B, COVID) ARPGX2
Influenza A by PCR: NEGATIVE
Influenza B by PCR: NEGATIVE
SARS Coronavirus 2 by RT PCR: NEGATIVE

## 2021-10-23 MED ORDER — LIDOCAINE VISCOUS HCL 2 % MT SOLN
15.0000 mL | Freq: Once | OROMUCOSAL | Status: AC
  Administered 2021-10-23: 15 mL via OROMUCOSAL
  Filled 2021-10-23: qty 15

## 2021-10-23 MED ORDER — DEXAMETHASONE 4 MG PO TABS
10.0000 mg | ORAL_TABLET | Freq: Once | ORAL | Status: AC
  Administered 2021-10-23: 10 mg via ORAL
  Filled 2021-10-23: qty 1

## 2021-10-23 NOTE — ED Notes (Signed)
Pt ambulatory to restroom w/ assistance from this writer and her husband. Pt provided with specimen cup and asked to provide urine sample if possible. ?

## 2021-10-23 NOTE — ED Provider Notes (Signed)
?[facility]-EMERGENCY DEPT ?Provider Note ? ? ?CSN: [id number] ?Arrival date & time: 10/23/21  5809 ? ?  ? ?History ? ?No chief complaint on file. ? ?Shannon Williamson is a 61 y.o. female presents to the ED via for evaluation of congestion, postnasal drip, cough for 2 months intermittently.  She reports she has been having some difficulty swallowing due to the amount of mucus in her throat/mouth.  She reports that this morning she was having difficulty swallowing, but is feeling better now.  She did not take her blood pressure medication this morning but brought it with her and she will take it now.  She denies any chest pain or any shortness of breath.  She reports some fatigue, but that is been a chronic issue. ? ?[name] used via phone line. ? ?HPI ? ?  ? ?Home Medications ?Prior to Admission medications   ?Medication Sig Start Date End Date Taking? Authorizing Provider  ?hydrochlorothiazide (MICROZIDE) 12.5 MG capsule Take 12.5 mg by mouth daily.    [provider]  ?lisinopril (PRINIVIL,ZESTRIL) 20 MG tablet Take 20 mg by mouth 2 (two) times daily.    [provider]  ?loratadine (CLARITIN) 10 MG tablet Take 10 mg by mouth daily as needed for allergies. ?Patient not taking: Reported on 03/14/2021    [provider]  ?meloxicam (MOBIC) 15 MG tablet Take 1 tablet (15 mg total) by mouth daily. 01/04/21 01/04/22  Kerrin Champagne, MD  ?metoprolol succinate (TOPROL-XL) 100 MG 24 hr tablet Take 100 mg by mouth daily. Take with or immediately following a meal.    [provider]  ?Multiple Vitamins-Minerals (MULTIVITAMIN WITH MINERALS) tablet Take 1 tablet by mouth daily. Woman    [provider]  ?pregabalin (LYRICA) 75 MG capsule Take 75 mg by mouth 3 (three) times daily. 09/02/21   [provider]  ?   ? ?Allergies    ?Patient has no known allergies.   ? ?Review of Systems   ?Review of Systems  ?Constitutional:  Negative for fever.  ?HENT:   Positive for congestion, rhinorrhea and trouble swallowing. Negative for sore throat.   ?Respiratory:  Positive for cough. Negative for shortness of breath.   ?Cardiovascular:  Negative for chest pain.  ?Gastrointestinal:  Negative for abdominal pain, nausea and vomiting.  ? ?Physical Exam ?Updated Vital Signs ?BP (!) 147/73   Pulse 72   Temp 97.7 ?F (36.5 ?C) (Oral)   Resp 18   SpO2 99%  ?Physical Exam ?Vitals and nursing note reviewed.  ?Constitutional:   ?   General: She is not in acute distress. ?   Appearance: She is not ill-appearing or toxic-appearing.  ?HENT:  ?   Head: Normocephalic and atraumatic.  ?   Right Ear: Tympanic membrane, ear canal and external ear normal.  ?   Left Ear: Tympanic membrane, ear canal and external ear normal.  ?   Nose:  ?   Comments: Bilateral nasal turbinate edema and erythema with scant clear nasal discharge. ?   Mouth/Throat:  ?   Mouth: Mucous membranes are moist.  ?   Pharynx: No oropharyngeal exudate or posterior oropharyngeal erythema.  ?   Comments: No pharyngeal erythema, exudate, or edema noted.  Uvula midline.  Airway patent.  Moist mucous membranes. ?Eyes:  ?   General: No scleral icterus. ?   Extraocular Movements: Extraocular movements intact.  ?   Conjunctiva/sclera: Conjunctivae normal.  ?   Pupils: Pupils are equal, round, and reactive  to light.  ?Neck:  ?   Thyroid: No thyroid mass or thyromegaly.  ?   Comments: No lymphadenopathy. ?Cardiovascular:  ?   Rate and Rhythm: Normal rate and regular rhythm.  ?   Pulses: Normal pulses.  ?Pulmonary:  ?   Effort: Pulmonary effort is normal. No respiratory distress.  ?   Breath sounds: Normal breath sounds. No stridor.  ?Abdominal:  ?   Palpations: Abdomen is soft.  ?   Tenderness: There is no abdominal tenderness. There is no guarding or rebound.  ?Musculoskeletal:     ?   General: No deformity.  ?   Cervical back: Normal range of motion and neck supple. No rigidity or tenderness.  ?Lymphadenopathy:  ?   Cervical: No  cervical adenopathy.  ?Skin: ?   Findings: No rash.  ?Neurological:  ?   General: No focal deficit present.  ?   Mental Status: She is alert.  ?   GCS: GCS eye subscore is 4. GCS verbal subscore is 5. GCS motor subscore is 6.  ?   Cranial Nerves: Cranial nerves 2-12 are intact. No cranial nerve deficit.  ?   Sensory: Sensation is intact. No sensory deficit.  ?   Motor: No weakness.  ?   Coordination: Romberg sign negative. Coordination normal. Finger-Nose-Finger Test normal.  ?   Gait: Gait normal.  ?   Comments: Swallow assessment at bedside.  Patient was able to swallow liquid and digest crackers without any problem.  ? ? ?ED Results / Procedures / Treatments   ?Labs ?(all labs ordered are listed, but only abnormal results are displayed) ?Labs Reviewed  ?GROUP A STREP BY PCR  ?RESP PANEL BY RT-PCR (FLU A&B, COVID) [id number]  ? ? ?EKG ?None ? ?Radiology ?DG Chest 2 View ? ?Result Date: 10/23/2021 ?CLINICAL DATA:  Congestion, sore throat, and body aches EXAM: CHEST - 2 VIEW COMPARISON:  11/17/2019 FINDINGS: Lungs are clear. Heart size upper limits normal. No effusion. Visualized bones unremarkable. IMPRESSION: No acute cardiopulmonary disease. Electronically Signed   By: Corlis Leak  Hassell M.D.   On: 10/23/2021 07:43   ? ?Procedures ?Procedures  ? ?Medications Ordered in ED ?Medications  ?dexamethasone (DECADRON) tablet 10 mg (10 mg Oral Given 10/23/21 [id number])  ?lidocaine (XYLOCAINE) 2 % viscous mouth solution 15 mL (15 mLs Mouth/Throat Given 10/23/21 [id number])  ? ? ?ED Course/ Medical Decision Making/ A&P ?  ?                        ?Medical Decision Making ?Amount and/or Complexity of Data Reviewed ?Radiology: ordered. ? ?Risk ?Prescription drug management. ? ? ?61 year old female presents emerged department for evaluation of congestion, postnasal drip, and occasional difficulty swallowing intermittently for the past 2 months.  Differential diagnosis includes is not limited to allergies, stroke, viral illness.  Vital signs initially  were hypertensive, the patient took her blood pressure medication and return to baseline.  Afebrile, normal pulse rate, satting well on room air without any increased work of breathing.  Physical exam is pertinent for no pharyngeal erythema, edema, or exudate noted.  Uvula midline.  Airway patent.  No thyromegaly visualized or palpated.  No cervical lymphadenopathy.  She has moist mucous membranes.  Normal TMs.  She does have some bilateral nasal turbinate edema and erythema with some scant clear nasal discharge.  Patient is alert with no cranial nerve deficit.  Her sensations intact.  No weakness.  She is Romberg negative, normal finger-to-nose, normal coordination.  Swallowing assessment performed at baseline and the patient was able to swallow water and digest crackers without any choking or coughing. ? ?Strep test negative. COVID and flu negative.  Chest x-ray shows no acute cardiopulmonary process. ? ?On reevaluation, the patient reports she is feeling better after the viscous lidocaine. ? ?At this time, I doubt any stroke as the patient's been having symptoms for 2 months intermittently and she has a benign neurological exam. ? ?I likely think the patient is having difficulty swallowing from phlegm in her throat.  We will give her some Decadron for any inflammation although none is visualized.  I recommended that she can take an OTC decongestant.  Recommended close follow-up with her primary care provider.  Return precautions were discussed.  Patient verbalized understanding and agrees to plan.  Patient stable and being discharged home in good condition. ? ?[name] used.  ? ?I discussed this case with my attending physician who cosigned this note including patient's presenting symptoms, physical exam, and planned diagnostics and interventions. Attending physician stated agreement with plan or made changes to plan which were implemented.  ? ?Final Clinical Impression(s) / ED Diagnoses ?Final diagnoses:   ?Nasal congestion  ?Swallowing problem  ? ? ?Rx / DC Orders ?ED Discharge Orders   ? ? None  ? ?  ? ? ?  ?Achille Rich, PA-C ?10/30/21 1710 ? ?  ?Lorre Nick, MD ?11/02/21 1913 ? ?

## 2021-10-23 NOTE — ED Triage Notes (Signed)
Pt. BIB [facility] c/o congestion. Per EMS pt. Says that this is normal for her when she has congestion. Pt. Also c/o of body aches, sore throat and SOB. ? ?EMS VS: ? ?BP: 160/90 ?HR: 70 ?O2:100% ?

## 2021-10-23 NOTE — Discharge Instructions (Addendum)
You were seen here today for evaluation of your moments of difficulty swallowing. You are able to swallow, eat, and drink here without issue. Your physical exam is normal. Please try a decongestant such as Mucinex for your phlegm.  ? ?Contact a health care provider if: ?You lose weight because you cannot swallow. ?You cough when you drink liquids. ?You cough up partially digested food. ?Get help right away if: ?You cannot swallow your saliva. ?You have shortness of breath, a fever, or both. ?Your voice is hoarse and you have trouble swallowing. ?These symptoms may represent a serious problem that is an emergency. Do not wait to see if the symptoms will go away. Get medical help right away. Call your local emergency services (911 in the [place] Do not drive yourself to the hospital. ?

## 2021-11-05 ENCOUNTER — Encounter: Payer: Self-pay | Admitting: Physical Therapy

## 2021-11-05 ENCOUNTER — Ambulatory Visit: Payer: BC Managed Care – PPO | Attending: Specialist | Admitting: Physical Therapy

## 2021-11-05 DIAGNOSIS — R262 Difficulty in walking, not elsewhere classified: Secondary | ICD-10-CM | POA: Diagnosis not present

## 2021-11-05 DIAGNOSIS — M545 Low back pain, unspecified: Secondary | ICD-10-CM | POA: Diagnosis not present

## 2021-11-05 DIAGNOSIS — M25512 Pain in left shoulder: Secondary | ICD-10-CM | POA: Insufficient documentation

## 2021-11-05 DIAGNOSIS — R600 Localized edema: Secondary | ICD-10-CM | POA: Insufficient documentation

## 2021-11-05 DIAGNOSIS — R2689 Other abnormalities of gait and mobility: Secondary | ICD-10-CM | POA: Diagnosis not present

## 2021-11-05 DIAGNOSIS — M6281 Muscle weakness (generalized): Secondary | ICD-10-CM | POA: Diagnosis not present

## 2021-11-05 NOTE — Patient Instructions (Signed)
Access Code: [email or web address] ?URL: [email or web address] ?Date: 11/05/2021 ?Prepared by: Oley Balm ? ?Exercises ?- Seated Cervical Sidebending Stretch  - 1 x daily - 7 x weekly - 3 reps - 15 hold ?- Seated Scapular Retraction  - 1 x daily - 7 x weekly - 3 reps - 15 hold ?- Doorway Pec Stretch at 90 Degrees Abduction  - 1 x daily - 7 x weekly - 3 reps - 15 hold ?- Doorway Pec Stretch at 60 Elevation  - 1 x daily - 7 x weekly - 3 reps - 15 hold ?- Standing Shoulder Internal Rotation Stretch Behind Back  - 1 x daily - 7 x weekly - 3 reps - 15 hold ?- Supine Hamstring Stretch  - 1 x daily - 7 x weekly - 3 reps - 15 hold ?- Supine ITB Stretch  - 1 x daily - 7 x weekly - 3 reps - 15 hold ?- Supine Bridge with Resistance Band  - 1 x daily - 7 x weekly - 2 sets - 10 reps ?- Sit to Stand Without Arm Support  - 1 x daily - 7 x weekly - 3 sets - 10 reps ?

## 2021-11-05 NOTE — Therapy (Signed)
[facility] ?[place] ?[address] ?[place], [place], [address] ?Phone: [phone]   Fax:  [phone] ? ?Physical Therapy Treatment ? ?Patient Details  ?Name: Shannon Williamson ?MRN: [id number] ?Date of Birth: Sep 24, 1960 ?Referring Provider (PT): Basil Dess E ? ? ?Encounter Date: 11/05/2021 ? ? PT End of Session - 11/05/21 1535   ? ? Visit Number 2   ? Date for PT Re-Evaluation 12/26/21   ? PT Start Time [id number]   ? PT Stop Time 1542   ? PT Time Calculation (min) 44 min   ? Activity Tolerance Patient tolerated treatment well;Patient limited by pain   ? Behavior During Therapy [facility] for tasks assessed/performed   ? ?  ?  ? ?  ? ? ?Past Medical History:  ?Diagnosis Date  ? Allergy   ? Arthritis   ? Hypertension   ? ? ?Past Surgical History:  ?Procedure Laterality Date  ? CARPAL TUNNEL RELEASE Right 06/21/2013  ? Procedure: RIGHT CARPAL TUNNEL RELEASE;  Surgeon: Wynonia Sours, MD;  Location: [facility];  Service: Orthopedics;  Laterality: Right;  ? NO PAST SURGERIES    ? ? ?There were no vitals filed for this visit. ? ? Subjective Assessment - 11/05/21 1500   ? ? Subjective patient reports no real change in her pain.   ? How long can you sit comfortably? N/A   ? How long can you stand comfortably? Standing still causes legs to feel like they will buckle   ? How long can you walk comfortably? No pain, but her weakness causes falls and near falls as her legs give out.   ? Diagnostic tests X ray of back (-), X ray of L shoulder shows narrowing of subacromial joint space.   ? Patient Stated Goals Increased leg strength, decresed shoulder pain.   ? Currently in Pain? No/denies   ? ?  ?  ? ?  ? ? ? ? ? ? ? ? ? ? ? ? ? ? ? ? ? ? ? ? [facility] Adult PT Treatment/Exercise - 11/05/21 0001   ? ?  ? Exercises  ? Exercises Neck;Lumbar   ?  ? Neck Exercises: Seated  ? Other Seated Exercise scapular retraction x 10 reps 3 sec hold.   ? Other Seated Exercise shoulder IR stretch, grapsp  wrist behind back, side bend neck away for additional stretch to neck. 3 x 15   ?  ? Neck Exercises: Stretches  ? Upper Trapezius Stretch Right;Left;3 reps;20 seconds   ? Neck Stretch 3 reps;20 seconds   ? Corner Stretch 3 reps;30 seconds   doorway at 60 and 90 degrees  ?  ? Lumbar Exercises: Stretches  ? Active Hamstring Stretch Right;Left;3 reps;20 seconds   ? ITB Stretch Right;Left;3 reps;20 seconds   ?  ? Lumbar Exercises: Seated  ? Sit to Stand 10 reps   ? Sit to Stand Limitations no UE   ?  ? Lumbar Exercises: Supine  ? Bridge with clamshell Non-compliant;10 reps;2 seconds   Re dtheraband resistance  ?  ? Manual Therapy  ? Manual Therapy Soft tissue mobilization;Passive ROM;Joint mobilization   ? Joint Mobilization L shoulder depression and posterior, 2 x 10 grade 3   ? Soft tissue mobilization pects, UP traps, scalenes, rhomboids   ? Passive ROM neck, scapula-L   ? ?  ?  ? ?  ? ? ? ? ? ? ? ? ? ? PT Education - 11/05/21 1534   ? ?  Education Details HEP   ? ?  ?  ? ?  ? ? ? PT Short Term Goals - 11/05/21 1534   ? ?  ? PT SHORT TERM GOAL #1  ? Title I with basic HEP   ? Baseline iniitated   ? Time 2   ? Period Weeks   ? Status On-going   ? Target Date 11/14/21   ?  ? PT SHORT TERM GOAL #2  ? Title Patient will be able to perform L shoulder elevation to at least 90 degrees with pain < 4/10   ? Baseline 90 degrees iwth severe pain.   ? Time 2   ? Period Weeks   ? Status On-going   ? Target Date 11/14/21   ?  ? PT SHORT TERM GOAL #3  ? Title Patient will clear hips from table while performing U bridge on solid surface.   ? Baseline Unable to lift either hip in U bridge.   ? Time 2   ? Period Weeks   ? Status On-going   ? Target Date 11/14/21   ? ?  ?  ? ?  ? ? ? ? PT Long Term Goals - 10/17/21 1653   ? ?  ? PT LONG TERM GOAL #1  ? Title I with final HEP   ? Time 10   ? Period Weeks   ? Status New   ? Target Date 12/26/21   ?  ? PT LONG TERM GOAL #2  ? Title Patient will complete 5xSTS in <20 seconds to demosntrate  improved BLE strength.   ? Baseline 33.85 sec   ? Time 10   ? Period Weeks   ? Status New   ? Target Date 12/26/21   ?  ? PT LONG TERM GOAL #3  ? Title Patient will complete TUG in < 15 seconds to demosntrate decreased fall risk.   ? Baseline 25.25   ? Time 10   ? Period Weeks   ? Status New   ? Target Date 12/26/21   ?  ? PT LONG TERM GOAL #4  ? Title Patient will ambulate at least 500' with LRAD, demonstrate functional speed, normalized pattern, no unsteadiness.   ? Baseline 26' with very slow pace, multiple deviations, unsteady.   ? Time 10   ? Period Weeks   ? Status New   ? Target Date 12/26/21   ?  ? PT LONG TERM GOAL #5  ? Title Patient will be able to perform her normal daily activities with <3/10 L shoulder pain.   ? Baseline Unable to elevate past 90 degrees, reports severe pain.   ? Time 10   ? Period Weeks   ? Status New   ? Target Date 12/26/21   ? ?  ?  ? ?  ? ? ? ? ? ? ? ? Plan - 11/05/21 1823   ? ? Clinical Impression Statement Patient reports no real changes in her status. Treatment focused on STM due to severely tight neck muscles in both ant and post, as well as L shoulder joint mobs. Initiated HEP for stretching and strenghtening. Patient performed all activities, but required frequent cueing. Flat affect noted with minimal feedback from patient.   ? Personal Factors and Comorbidities Fitness;Comorbidity 1   ? Comorbidities Lumbar fusion.   ? Examination-Activity Limitations Locomotion Level;Transfers;Reach Overhead;Bed Mobility;Carry;Squat;Dressing;Hygiene/Grooming;Lift;Stand   ? Examination-Participation Restrictions Meal Prep;Cleaning;Laundry   ? Stability/Clinical Decision Making Evolving/Moderate complexity   ? Clinical Decision  Making Moderate   ? Rehab Potential Good   ? PT Frequency 2x / week   ? PT Duration 8 weeks   10w  ? PT Treatment/Interventions ADLs/Self Care Home Management;Iontophoresis 4mg /ml Dexamethasone;Gait training;Moist Heat;Traction;Ultrasound;Electrical  Stimulation;Cryotherapy;Neuromuscular re-education;Manual techniques;Balance training;Therapeutic exercise;Dry needling;Passive range of motion;Therapeutic activities;Functional mobility training;Patient/family education;Vasopneumatic Device   ? PT Next Visit Plan L shoulder, upper quadrant, and low back STM, stretch, initiate stregnthening for hips and trunk. HEP.   ? PT Home Exercise Plan [id number]   ? Consulted and Agree with Plan of Care Patient   ? ?  ?  ? ?  ? ? ?Patient will benefit from skilled therapeutic intervention in order to improve the following deficits and impairments:  Abnormal gait, Decreased coordination, Difficulty walking, Increased fascial restricitons, Impaired UE functional use, Pain, Decreased activity tolerance, Decreased balance, Improper body mechanics, Postural dysfunction, Decreased strength, Decreased mobility ? ?Visit Diagnosis: ?Acute bilateral low back pain, unspecified whether sciatica present ? ?Muscle weakness (generalized) ? ?Localized edema ? ?Other abnormalities of gait and mobility ? ?Acute pain of left shoulder ? ?Difficulty in walking, not elsewhere classified ? ? ? ? ?Problem List ?Patient Active Problem List  ? Diagnosis Date Noted  ? Spondylolisthesis, lumbar region 11/15/2019  ?  Class: Chronic  ? Spinal stenosis, lumbar region with neurogenic claudication 11/15/2019  ?  Class: Chronic  ? S/P lumbar spinal fusion 11/15/2019  ? Abdominal pain 02/23/2018  ? Fever 02/23/2018  ? Generalized body aches 02/23/2018  ? Viral illness 02/23/2018  ? Acute bronchitis 02/26/2017  ? ? ?Marcelina Morel, DPT ?11/05/2021, 6:30 PM ? ?[facility] ?[address] ?[place], [place], [address] ?Phone: [phone]   Fax:  [phone] ? ?Name: Shannon Williamson ?MRN: [id number] ?Date of Birth: 11/18/1960 ? ? ? ?

## 2021-11-08 ENCOUNTER — Encounter: Payer: Self-pay | Admitting: Physical Therapy

## 2021-11-08 ENCOUNTER — Ambulatory Visit: Payer: BC Managed Care – PPO | Admitting: Physical Therapy

## 2021-11-08 DIAGNOSIS — M25512 Pain in left shoulder: Secondary | ICD-10-CM

## 2021-11-08 DIAGNOSIS — R600 Localized edema: Secondary | ICD-10-CM | POA: Diagnosis not present

## 2021-11-08 DIAGNOSIS — M6281 Muscle weakness (generalized): Secondary | ICD-10-CM

## 2021-11-08 DIAGNOSIS — R262 Difficulty in walking, not elsewhere classified: Secondary | ICD-10-CM

## 2021-11-08 DIAGNOSIS — M545 Low back pain, unspecified: Secondary | ICD-10-CM

## 2021-11-08 DIAGNOSIS — R2689 Other abnormalities of gait and mobility: Secondary | ICD-10-CM

## 2021-11-08 NOTE — Therapy (Signed)
[facility] ?[place] ?[address] ?[place], [place], [address] ?Phone: [phone]   Fax:  [phone] ? ?Physical Therapy Treatment ? ?Patient Details  ?Name: Shannon Williamson ?MRN: [id number] ?Date of Birth: [birth year] ?Referring Provider (PT): Basil Dess E ? ? ?Encounter Date: 11/08/2021 ? ? PT End of Session - 11/08/21 1643   ? ? Visit Number 3   ? Date for PT Re-Evaluation 12/26/21   ? PT Start Time 1552   ? PT Stop Time [id number]   ? PT Time Calculation (min) 60 min   ? Activity Tolerance Patient tolerated treatment well;Patient limited by pain   ? Behavior During Therapy [facility] for tasks assessed/performed   ? ?  ?  ? ?  ? ? ?Past Medical History:  ?Diagnosis Date  ? Allergy   ? Arthritis   ? Hypertension   ? ? ?Past Surgical History:  ?Procedure Laterality Date  ? CARPAL TUNNEL RELEASE Right 06/21/2013  ? Procedure: RIGHT CARPAL TUNNEL RELEASE;  Surgeon: Wynonia Sours, MD;  Location: [facility];  Service: Orthopedics;  Laterality: Right;  ? NO PAST SURGERIES    ? ? ?There were no vitals filed for this visit. ? ? Subjective Assessment - 11/08/21 1605   ? ? Subjective Maybe little better, still sore and stiff   ? Currently in Pain? Yes   ? Pain Score 4    ? Pain Location Back   ? Pain Descriptors / Indicators Sore   ? Pain Onset 1 to 4 weeks ago   ? Aggravating Factors  movements   ? ?  ?  ? ?  ? ? ? ? ? ? ? ? ? ? ? ? ? ? ? ? ? ? ? ? [facility] Adult PT Treatment/Exercise - 11/08/21 0001   ? ?  ? Neck Exercises: Seated  ? Other Seated Exercise scapular retraction x 10 reps 3 sec hold.   ?  ? Lumbar Exercises: Aerobic  ? Recumbent Bike level 3 x 5 minutes   ?  ? Lumbar Exercises: Standing  ? Row Strengthening;Both;20 reps;Theraband   ? Theraband Level (Row) Level 1 (Yellow)   ? Shoulder Extension Strengthening;Both;20 reps;Theraband   ? Theraband Level (Shoulder Extension) Level 1 (Yellow)   ?  ? Lumbar Exercises: Seated  ? Sit to Stand 5 reps   x2 no UE  ?  ? Lumbar  Exercises: Supine  ? Bridge with [employer] 15 reps   ? Bridge with clamshell 15 reps   ? Other Supine Lumbar Exercises feet on ball K2C, trunk rotation, small bridges and isometric aabs, a lot of cues to help   ?  ? Modalities  ? Modalities Moist Heat;Electrical Stimulation   ?  ? Moist Heat Therapy  ? Number Minutes Moist Heat 8 Minutes   ? Moist Heat Location Cervical;Lumbar Spine   ?  ? Electrical Stimulation  ? Electrical Stimulation Location C/T area   ? Electrical Stimulation Action IFC   ? Electrical Stimulation Parameters supine   ? Electrical Stimulation Goals Pain   ?  ? Manual Therapy  ? Manual Therapy Soft tissue mobilization;Passive ROM;Joint mobilization   ? Joint Mobilization L shoulder depression and posterior, 2 x 10 grade 3   ? Soft tissue mobilization pects, UP traps, scalenes, rhomboids   ? Passive ROM neck, scapula-L   ? ?  ?  ? ?  ? ? ? ? ? ? ? ? ? ? ? ? PT Short  Term Goals - 11/05/21 1534   ? ?  ? PT SHORT TERM GOAL #1  ? Title I with basic HEP   ? Baseline iniitated   ? Time 2   ? Period Weeks   ? Status On-going   ? Target Date 11/14/21   ?  ? PT SHORT TERM GOAL #2  ? Title Patient will be able to perform L shoulder elevation to at least 90 degrees with pain < 4/10   ? Baseline 90 degrees iwth severe pain.   ? Time 2   ? Period Weeks   ? Status On-going   ? Target Date 11/14/21   ?  ? PT SHORT TERM GOAL #3  ? Title Patient will clear hips from table while performing U bridge on solid surface.   ? Baseline Unable to lift either hip in U bridge.   ? Time 2   ? Period Weeks   ? Status On-going   ? Target Date 11/14/21   ? ?  ?  ? ?  ? ? ? ? PT Long Term Goals - 11/08/21 1646   ? ?  ? PT LONG TERM GOAL #1  ? Title I with final HEP   ? Status On-going   ?  ? PT LONG TERM GOAL #2  ? Title Patient will complete 5xSTS in <20 seconds to demosntrate improved BLE strength.   ? Status On-going   ? ?  ?  ? ?  ? ? ? ? ? ? ? ? Plan - 11/08/21 1643   ? ? Clinical Impression Statement Patient has  difficulty describing her status, she thinks she is a little better, she is very sore and very tight, I added a few exercises tofday and then finished with the heat and estim due to her able to rate her pain today higher than previous,  She needs a lot of verbal and tactile cues to do the exercises   ? PT Next Visit Plan continue to try to progress her function and decreae her pain   ? Consulted and Agree with Plan of Care Patient   ? ?  ?  ? ?  ? ? ?Patient will benefit from skilled therapeutic intervention in order to improve the following deficits and impairments:  Abnormal gait, Decreased coordination, Difficulty walking, Increased fascial restricitons, Impaired UE functional use, Pain, Decreased activity tolerance, Decreased balance, Improper body mechanics, Postural dysfunction, Decreased strength, Decreased mobility ? ?Visit Diagnosis: ?Acute bilateral low back pain, unspecified whether sciatica present ? ?Muscle weakness (generalized) ? ?Localized edema ? ?Other abnormalities of gait and mobility ? ?Acute pain of left shoulder ? ?Difficulty in walking, not elsewhere classified ? ? ? ? ?Problem List ?Patient Active Problem List  ? Diagnosis Date Noted  ? Spondylolisthesis, lumbar region 11/15/2019  ?  Class: Chronic  ? Spinal stenosis, lumbar region with neurogenic claudication 11/15/2019  ?  Class: Chronic  ? S/P lumbar spinal fusion 11/15/2019  ? Abdominal pain 02/23/2018  ? Fever 02/23/2018  ? Generalized body aches 02/23/2018  ? Viral illness 02/23/2018  ? Acute bronchitis 02/26/2017  ? ? Sumner Boast, PT ?11/08/2021, 4:47 PM ? ?[facility] ?[address] [address] ?[place], [place], [address] ?Phone: [phone]   Fax:  [phone] ? ?Name: Shannon Williamson ?MRN: [id number] ?Date of Birth: February 19, 1961 ? ? ? ?

## 2021-11-12 ENCOUNTER — Encounter: Payer: Self-pay | Admitting: Physical Therapy

## 2021-11-12 ENCOUNTER — Ambulatory Visit: Payer: BC Managed Care – PPO | Admitting: Physical Therapy

## 2021-11-12 DIAGNOSIS — R262 Difficulty in walking, not elsewhere classified: Secondary | ICD-10-CM | POA: Diagnosis not present

## 2021-11-12 DIAGNOSIS — M545 Low back pain, unspecified: Secondary | ICD-10-CM | POA: Diagnosis not present

## 2021-11-12 DIAGNOSIS — M6281 Muscle weakness (generalized): Secondary | ICD-10-CM

## 2021-11-12 DIAGNOSIS — M25512 Pain in left shoulder: Secondary | ICD-10-CM

## 2021-11-12 DIAGNOSIS — R600 Localized edema: Secondary | ICD-10-CM | POA: Diagnosis not present

## 2021-11-12 DIAGNOSIS — R2689 Other abnormalities of gait and mobility: Secondary | ICD-10-CM

## 2021-11-12 NOTE — Therapy (Signed)
[facility] ?[address] ?[place], [place], [address] ?Phone: [phone]   Fax:  [phone] ? ?Physical Therapy Treatment ? ?Patient Details  ?Name: Shannon Williamson ?MRN: [id number] ?Date of Birth: [birth year] ?Referring Provider (PT): Vira Browns E ? ? ?Encounter Date: 11/12/2021 ? ? PT End of Session - 11/12/21 1537   ? ? Visit Number 4   ? Date for PT Re-Evaluation 12/26/21   ? PT Start Time 1452   ? PT Stop Time 1540   ? PT Time Calculation (min) 48 min   ? Activity Tolerance Patient tolerated treatment well;Patient limited by pain   ? Behavior During Therapy [facility] for tasks assessed/performed   ? ?  ?  ? ?  ? ? ?Past Medical History:  ?Diagnosis Date  ? Allergy   ? Arthritis   ? Hypertension   ? ? ?Past Surgical History:  ?Procedure Laterality Date  ? CARPAL TUNNEL RELEASE Right 06/21/2013  ? Procedure: RIGHT CARPAL TUNNEL RELEASE;  Surgeon: Nicki Reaper, MD;  Location:  [facility];  Service: Orthopedics;  Laterality: Right;  ? NO PAST SURGERIES    ? ? ?There were no vitals filed for this visit. ? ? Subjective Assessment - 11/12/21 1452   ? ? Subjective Patient reports that her calves are hurting, no pattern to the pain, unable to describe it. Her neck feels better, but her back still hurts.   ? How long can you sit comfortably? N/A   ? How long can you stand comfortably? Standing still causes legs to feel like they will buckle   ? How long can you walk comfortably? No pain, but her weakness causes falls and near falls as her legs give out.   ? Diagnostic tests X ray of back (-), X ray of L shoulder shows narrowing of subacromial joint space.   ? Patient Stated Goals Increased leg strength, decresed shoulder pain.   ? Currently in Pain? Yes   ? Pain Score 4    ? Pain Location Back   ? Pain Orientation Lower   ? Pain Descriptors / Indicators Sore   ? Pain Radiating Towards Also reports pain in her calves. Unable to determine if the leg pain is  related to back pain.   ? Pain Onset 1 to 4 weeks ago   ? Pain Frequency Intermittent   ? ?  ?  ? ?  ? ? ? ? ? ? ? ? ? ? ? ? ? ? ? ? ? ? ? ? [facility] Adult PT Treatment/Exercise - 11/12/21 0001   ? ?  ? Lumbar Exercises: Stretches  ? Active Hamstring Stretch Right;Left;3 reps;20 seconds   Gentle ankle pumps to also stretch calves.  ? Single Knee to Chest Stretch Right;Left;3 reps;10 seconds   ? Double Knee to Chest Stretch 1 rep;30 seconds   ? ITB Stretch Right;Left;3 reps;20 seconds   ?  ? Lumbar Exercises: Aerobic  ? Nustep L4 x 5 minutes.   ?  ? Lumbar Exercises: Standing  ? Row Strengthening;Both;20 reps   10# pulleys  ? Shoulder Extension Strengthening;Both;20 reps   10# pulleys  ?  ? Lumbar Exercises: Seated  ? Other Seated Lumbar Exercises Rotate to R, walk hands out to the side as far as possible and then back x 5 reps, repeat to L.   ?  ? Lumbar Exercises: Supine  ? Bridge Non-compliant;10 reps;2 seconds   ? Bridge with clamshell Non-compliant;10 reps  Red Tband  ?  ? Modalities  ? Modalities Moist Heat;Electrical Stimulation   ?  ? Moist Heat Therapy  ? Number Minutes Moist Heat 12 Minutes   ? Moist Heat Location Cervical;Lumbar Spine   ?  ? Electrical Stimulation  ? Electrical Stimulation Location C/T area   ? Electrical Stimulation Action IFC   ? Electrical Stimulation Parameters supine   ? Electrical Stimulation Goals Pain   ?  ? Manual Therapy  ? Manual Therapy Soft tissue mobilization;Passive ROM;Joint mobilization   ? Joint Mobilization L shoulder depression and posterior, 2 x 10 grade 3   ? Soft tissue mobilization pects, UP traps, scalenes, rhomboids   ? Passive ROM neck, scapula-L   ? ?  ?  ? ?  ? ? ? ? ? ? ? ? ? ? ? ? PT Short Term Goals - 11/12/21 1525   ? ?  ? PT SHORT TERM GOAL #1  ? Title I with basic HEP   ? Baseline iniitated   ? Time 1   ? Period Weeks   ? Status On-going   ? Target Date 11/14/21   ?  ? PT SHORT TERM GOAL #2  ? Title Patient will be able to perform L shoulder elevation to at  least 90 degrees with pain < 4/10   ? Baseline 90 degrees with pain to 6/10   ? Time 1   ? Period Weeks   ? Status On-going   ? Target Date 11/14/21   ?  ? PT SHORT TERM GOAL #3  ? Title Patient will clear hips from table while performing U bridge on solid surface.   ? Baseline Improved elevatrion, but still unable to clear hips.   ? Time 1   ? Period Weeks   ? Status On-going   ? Target Date 11/14/21   ? ?  ?  ? ?  ? ? ? ? PT Long Term Goals - 11/08/21 1646   ? ?  ? PT LONG TERM GOAL #1  ? Title I with final HEP   ? Status On-going   ?  ? PT LONG TERM GOAL #2  ? Title Patient will complete 5xSTS in <20 seconds to demosntrate improved BLE strength.   ? Status On-going   ? ?  ?  ? ?  ? ? ? ? ? ? ? ? Plan - 11/12/21 1500   ? ? Clinical Impression Statement Patient continues to have difficulty expressing herself, but seems to report pain in B calves and lower back, improve neck pain. Treatment focused on stretch and strenghtening to see if increased movement helps with her pain. Continued to perform STM and joint mobs for L shoulder as well. Finishded with estim and heat. Difficut to determine whether patient is responding to treatment or not as she is very quiet and minimally responds to questions, often stating "alright, ok" when asked how she feels.   ? Personal Factors and Comorbidities Fitness;Comorbidity 1   ? Comorbidities Lumbar fusion.   ? Examination-Activity Limitations Locomotion Level;Transfers;Reach Overhead;Bed Mobility;Carry;Squat;Dressing;Hygiene/Grooming;Lift;Stand   ? Examination-Participation Restrictions Meal Prep;Cleaning;Laundry   ? Stability/Clinical Decision Making Evolving/Moderate complexity   ? Clinical Decision Making Moderate   ? Rehab Potential Good   ? PT Frequency 2x / week   ? PT Treatment/Interventions ADLs/Self Care Home Management;Iontophoresis 4mg /ml Dexamethasone;Gait training;Moist Heat;Traction;Ultrasound;Electrical Stimulation;Cryotherapy;Neuromuscular re-education;Manual  techniques;Balance training;Therapeutic exercise;Dry needling;Passive range of motion;Therapeutic activities;Functional mobility training;Patient/family education;Vasopneumatic Device   ? PT Next Visit Plan continue to try  to progress her function and decreae her pain   ? PT Home Exercise Plan [id number]   ? Consulted and Agree with Plan of Care Patient   ? ?  ?  ? ?  ? ? ?Patient will benefit from skilled therapeutic intervention in order to improve the following deficits and impairments:  Abnormal gait, Decreased coordination, Difficulty walking, Increased fascial restricitons, Impaired UE functional use, Pain, Decreased activity tolerance, Decreased balance, Improper body mechanics, Postural dysfunction, Decreased strength, Decreased mobility ? ?Visit Diagnosis: ?Acute bilateral low back pain, unspecified whether sciatica present ? ?Muscle weakness (generalized) ? ?Other abnormalities of gait and mobility ? ?Acute pain of left shoulder ? ?Localized edema ? ?Difficulty in walking, not elsewhere classified ? ? ? ? ?Problem List ?Patient Active Problem List  ? Diagnosis Date Noted  ? Spondylolisthesis, lumbar region 11/15/2019  ?  Class: Chronic  ? Spinal stenosis, lumbar region with neurogenic claudication 11/15/2019  ?  Class: Chronic  ? S/P lumbar spinal fusion 11/15/2019  ? Abdominal pain 02/23/2018  ? Fever 02/23/2018  ? Generalized body aches 02/23/2018  ? Viral illness 02/23/2018  ? Acute bronchitis 02/26/2017  ? ? ?Marcelina Morel, DPT ?11/12/2021, 3:39 PM ? ?[place] ?[address] ?[place], [place], [address] ?Phone: [phone]   Fax:  [phone] ? ?Name: Shannon Williamson ?MRN: [id number] ?Date of Birth: 06-19-1961 ? ? ? ?

## 2021-11-15 ENCOUNTER — Ambulatory Visit: Payer: BC Managed Care – PPO | Admitting: Physical Therapy

## 2021-11-15 ENCOUNTER — Encounter: Payer: Self-pay | Admitting: Physical Therapy

## 2021-11-15 DIAGNOSIS — M545 Low back pain, unspecified: Secondary | ICD-10-CM

## 2021-11-15 DIAGNOSIS — R600 Localized edema: Secondary | ICD-10-CM

## 2021-11-15 DIAGNOSIS — R2689 Other abnormalities of gait and mobility: Secondary | ICD-10-CM

## 2021-11-15 DIAGNOSIS — R262 Difficulty in walking, not elsewhere classified: Secondary | ICD-10-CM

## 2021-11-15 DIAGNOSIS — M25512 Pain in left shoulder: Secondary | ICD-10-CM

## 2021-11-15 DIAGNOSIS — M6281 Muscle weakness (generalized): Secondary | ICD-10-CM

## 2021-11-15 NOTE — Therapy (Signed)
[place] ?[address] ?[place], [place], [address] ?Phone: [phone]   Fax:  [phone] ? ?Physical Therapy Treatment ? ?Patient Details  ?Name: Shannon Williamson ?MRN: [id number] ?Date of Birth: 12-31-1960 ?Referring Provider (PT): Basil Dess E ? ? ?Encounter Date: 11/15/2021 ? ? PT End of Session - 11/15/21 1555   ? ? Visit Number 5   ? Date for PT Re-Evaluation 12/26/21   ? PT Start Time [id number]   ? PT Stop Time [id number]   ? PT Time Calculation (min) 46 min   ? Activity Tolerance Patient tolerated treatment well;Patient limited by pain   ? Behavior During Therapy [facility] for tasks assessed/performed   ? ?  ?  ? ?  ? ? ?Past Medical History:  ?Diagnosis Date  ? Allergy   ? Arthritis   ? Hypertension   ? ? ?Past Surgical History:  ?Procedure Laterality Date  ? CARPAL TUNNEL RELEASE Right 06/21/2013  ? Procedure: RIGHT CARPAL TUNNEL RELEASE;  Surgeon: Wynonia Sours, MD;  Location: [place];  Service: Orthopedics;  Laterality: Right;  ? NO PAST SURGERIES    ? ? ?There were no vitals filed for this visit. ? ? Subjective Assessment - 11/15/21 1521   ? ? Subjective Patient reports that seh continues ot have some calf pain, but denies pain anywhere. else.   ? Currently in Pain? Yes   ? Pain Score 4    ? Pain Location Calf   ? Pain Orientation Left   ? Pain Descriptors / Indicators Sore   ? Pain Type Acute pain   ? Pain Onset In the past 7 days   ? Pain Frequency Intermittent   ? ?  ?  ? ?  ? ? ? ? ? ? ? ? ? ? ? ? ? ? ? ? ? ? ? ? [facility] Adult PT Treatment/Exercise - 11/15/21 0001   ? ?  ? Neck Exercises: Machines for Strengthening  ? Cybex Row 2 x 10 reps, 10# resistance   ? Lat Pull 2 x 10 reps, 15#   ?  ? Neck Exercises: Standing  ? Other Standing Exercises lawn mowers with 2# weight 2 x 10 reps each side.   ?  ? Neck Exercises: Seated  ? Shoulder Rolls Backwards;10 reps   2# weights.  ?  ? Lumbar Exercises: Aerobic  ? Stationary Bike Attempted, but paiten  thad difficulty keeping her feet on the pedals.   ? Recumbent Bike L1 x 5 minutes.   ?  ? Lumbar Exercises: Seated  ? Other Seated Lumbar Exercises Seated trunk extension against black Tband resistance, 2 x 10 reps   ?  ? Moist Heat Therapy  ? Number Minutes Moist Heat 12 Minutes   ? Moist Heat Location Cervical;Lumbar Spine   ?  ? Electrical Stimulation  ? Electrical Stimulation Location C/T area   ? Electrical Stimulation Action IFC   ? Electrical Stimulation Parameters supine   ? Electrical Stimulation Goals Pain   ?  ? Manual Therapy  ? Manual Therapy Soft tissue mobilization;Passive ROM;Manual Traction   ? Soft tissue mobilization pects, UP traps, scalenes, rhomboids   ? Passive ROM L scapula abd, distraction   ? ?  ?  ? ?  ? ? ? ? ? ? ? ? ? ? ? ? PT Short Term Goals - 11/12/21 1525   ? ?  ? PT SHORT TERM GOAL #1  ? Title I with  basic HEP   ? Baseline iniitated   ? Time 1   ? Period Weeks   ? Status On-going   ? Target Date 11/14/21   ?  ? PT SHORT TERM GOAL #2  ? Title Patient will be able to perform L shoulder elevation to at least 90 degrees with pain < 4/10   ? Baseline 90 degrees with pain to 6/10   ? Time 1   ? Period Weeks   ? Status On-going   ? Target Date 11/14/21   ?  ? PT SHORT TERM GOAL #3  ? Title Patient will clear hips from table while performing U bridge on solid surface.   ? Baseline Improved elevatrion, but still unable to clear hips.   ? Time 1   ? Period Weeks   ? Status On-going   ? Target Date 11/14/21   ? ?  ?  ? ?  ? ? ? ? PT Long Term Goals - 11/08/21 1646   ? ?  ? PT LONG TERM GOAL #1  ? Title I with final HEP   ? Status On-going   ?  ? PT LONG TERM GOAL #2  ? Title Patient will complete 5xSTS in <20 seconds to demosntrate improved BLE strength.   ? Status On-going   ? ?  ?  ? ?  ? ? ? ? ? ? ? ? Plan - 11/15/21 1556   ? ? Clinical Impression Statement Patient reports L calf pain and L shoulder pain. She tolerated all strengthening, upper trunk muscles continue to be tight, performed  STM and stretch B, emphasizing L side. Again attempted to elicit more detailed information form paitent regarding if she feels treatment is effective, but she remains vague, saying things are ok.   ? Personal Factors and Comorbidities Fitness;Comorbidity 1   ? Comorbidities Lumbar fusion.   ? Examination-Activity Limitations Locomotion Level;Transfers;Reach Overhead;Bed Mobility;Carry;Squat;Dressing;Hygiene/Grooming;Lift;Stand   ? Examination-Participation Restrictions Meal Prep;Cleaning;Laundry   ? Stability/Clinical Decision Making Evolving/Moderate complexity   ? Clinical Decision Making Moderate   ? Rehab Potential Good   ? PT Frequency 2x / week   ? PT Duration Other (comment)   7w  ? PT Treatment/Interventions ADLs/Self Care Home Management;Iontophoresis 4mg /ml Dexamethasone;Gait training;Moist Heat;Traction;Ultrasound;Electrical Stimulation;Cryotherapy;Neuromuscular re-education;Manual techniques;Balance training;Therapeutic exercise;Dry needling;Passive range of motion;Therapeutic activities;Functional mobility training;Patient/family education;Vasopneumatic Device   ? PT Next Visit Plan continue to try to progress her function and decreae her pain   ? PT Home Exercise Plan [id number]   ? Consulted and Agree with Plan of Care Patient   ? ?  ?  ? ?  ? ? ?Patient will benefit from skilled therapeutic intervention in order to improve the following deficits and impairments:  Abnormal gait, Decreased coordination, Difficulty walking, Increased fascial restricitons, Impaired UE functional use, Pain, Decreased activity tolerance, Decreased balance, Improper body mechanics, Postural dysfunction, Decreased strength, Decreased mobility ? ?Visit Diagnosis: ?Acute bilateral low back pain, unspecified whether sciatica present ? ?Muscle weakness (generalized) ? ?Other abnormalities of gait and mobility ? ?Acute pain of left shoulder ? ?Localized edema ? ?Difficulty in walking, not elsewhere classified ? ? ? ? ?Problem  List ?Patient Active Problem List  ? Diagnosis Date Noted  ? Spondylolisthesis, lumbar region 11/15/2019  ?  Class: Chronic  ? Spinal stenosis, lumbar region with neurogenic claudication 11/15/2019  ?  Class: Chronic  ? S/P lumbar spinal fusion 11/15/2019  ? Abdominal pain 02/23/2018  ? Fever 02/23/2018  ? Generalized body aches 02/23/2018  ?  Viral illness 02/23/2018  ? Acute bronchitis 02/26/2017  ? ? ?Marcelina Morel, DPT ?11/15/2021, 3:59 PM ? ?[facility] ?[address] ?[place], [place], [address] ?Phone: [phone]   Fax:  [phone] ? ?Name: Genevea Sieloff ?MRN: [id number] ?Date of Birth: 11/08/1960 ? ? ? ?

## 2021-11-19 ENCOUNTER — Ambulatory Visit: Payer: BC Managed Care – PPO | Admitting: Physical Therapy

## 2021-11-21 ENCOUNTER — Encounter: Payer: Self-pay | Admitting: Physical Therapy

## 2021-11-21 ENCOUNTER — Ambulatory Visit: Payer: BC Managed Care – PPO | Attending: Specialist | Admitting: Physical Therapy

## 2021-11-21 DIAGNOSIS — M545 Low back pain, unspecified: Secondary | ICD-10-CM | POA: Diagnosis not present

## 2021-11-21 DIAGNOSIS — R262 Difficulty in walking, not elsewhere classified: Secondary | ICD-10-CM | POA: Insufficient documentation

## 2021-11-21 DIAGNOSIS — R600 Localized edema: Secondary | ICD-10-CM | POA: Diagnosis not present

## 2021-11-21 DIAGNOSIS — M25512 Pain in left shoulder: Secondary | ICD-10-CM | POA: Insufficient documentation

## 2021-11-21 DIAGNOSIS — R2689 Other abnormalities of gait and mobility: Secondary | ICD-10-CM | POA: Insufficient documentation

## 2021-11-21 DIAGNOSIS — M6281 Muscle weakness (generalized): Secondary | ICD-10-CM | POA: Diagnosis not present

## 2021-11-21 NOTE — Therapy (Signed)
[place] ?[facility] ?[address] ?[place], [place], [address] ?Phone: [phone]   Fax:  [phone] ? ?Physical Therapy Treatment ? ?Patient Details  ?Name: Shannon Williamson ?MRN: [id number] ?Date of Birth: [birth year] ?Referring Provider (PT): Vira Browns E ? ? ?Encounter Date: 11/21/2021 ? ? PT End of Session - 11/21/21 1630   ? ? Visit Number 6   ? Date for PT Re-Evaluation 12/26/21   ? PT Start Time 1533   ? PT Stop Time 1612   ? PT Time Calculation (min) 39 min   ? Activity Tolerance Patient tolerated treatment well   ? Behavior During Therapy [facility] for tasks assessed/performed   ? ?  ?  ? ?  ? ? ?Past Medical History:  ?Diagnosis Date  ? Allergy   ? Arthritis   ? Hypertension   ? ? ?Past Surgical History:  ?Procedure Laterality Date  ? CARPAL TUNNEL RELEASE Right 06/21/2013  ? Procedure: RIGHT CARPAL TUNNEL RELEASE;  Surgeon: Nicki Reaper, MD;  Location: [facility];  Service: Orthopedics;  Laterality: Right;  ? NO PAST SURGERIES    ? ? ?There were no vitals filed for this visit. ? ? Subjective Assessment - 11/21/21 1534   ? ? Subjective My legs are bothering me today, my knees feel weak. My left shoulder is hurting too.   ? Patient Stated Goals Increased leg strength, decresed shoulder pain.   ? Currently in Pain? Other (Comment)   unclear  ? Pain Score 7    6-7/10 in her left shoulder, legs not hurting  ? Pain Location Other (Comment)   left shoulder and knees  ? ?  ?  ? ?  ? ? ? ? ? ? ? ? ? ? ? ? ? ? ? ? ? ? ? ? [facility] Adult PT Treatment/Exercise - 11/21/21 0001   ? ?  ? Lumbar Exercises: Aerobic  ? Nustep L3x6 minutes BLEs only   ?  ? Lumbar Exercises: Standing  ? Heel Raises 15 reps   ? Other Standing Lumbar Exercises standing marches 1x20 no UEs; standing hip ABD 1x10 B   ?  ? Lumbar Exercises: Seated  ? Other Seated Lumbar Exercises seated LE strengthening: LAQs 2# 1x15 B, seated hip flexion 2# 1x15 B   ? Other Seated Lumbar Exercises seated clams  red TB 1x15   ?  ? Modalities  ? Modalities Iontophoresis   ?  ? Iontophoresis  ? Type of Iontophoresis Dexamethasone   ? Location anterior L shoulder   ? Dose 1.21mL   ? Time 4 hour patch   ? ?  ?  ? ?  ? ? ? ? ? ? ? ? ? ? PT Education - 11/21/21 1629   ? ? Education Details ionto patch precautions and removal time, exercise form and purpose   ? Person(s) Educated Patient;Spouse   ? Methods Explanation   ? Comprehension Verbalized understanding   ? ?  ?  ? ?  ? ? ? PT Short Term Goals - 11/12/21 1525   ? ?  ? PT SHORT TERM GOAL #1  ? Title I with basic HEP   ? Baseline iniitated   ? Time 1   ? Period Weeks   ? Status On-going   ? Target Date 11/14/21   ?  ? PT SHORT TERM GOAL #2  ? Title Patient will be able to perform L shoulder elevation to at least 90 degrees with pain <  4/10   ? Baseline 90 degrees with pain to 6/10   ? Time 1   ? Period Weeks   ? Status On-going   ? Target Date 11/14/21   ?  ? PT SHORT TERM GOAL #3  ? Title Patient will clear hips from table while performing U bridge on solid surface.   ? Baseline Improved elevatrion, but still unable to clear hips.   ? Time 1   ? Period Weeks   ? Status On-going   ? Target Date 11/14/21   ? ?  ?  ? ?  ? ? ? ? PT Long Term Goals - 11/08/21 1646   ? ?  ? PT LONG TERM GOAL #1  ? Title I with final HEP   ? Status On-going   ?  ? PT LONG TERM GOAL #2  ? Title Patient will complete 5xSTS in <20 seconds to demosntrate improved BLE strength.   ? Status On-going   ? ?  ?  ? ?  ? ? ? ? ? ? ? ? Plan - 11/21/21 1630   ? ? Clinical Impression Statement Alessandra BevelsBenedicta arrives today doing OK. It was difficult to communicate with her due to language barrier, I think she would benefit from an interpreter for this language (Twi) if we have access to one. Had her husband come back and he was able to explain education about Ionto patch to her today. We tried ionto on her L anterior shoulder, otherwise worked on LE strength. Will continue efforts.   ? Personal Factors and Comorbidities  Fitness;Comorbidity 1   ? Comorbidities Lumbar fusion.   ? Examination-Activity Limitations Locomotion Level;Transfers;Reach Overhead;Bed Mobility;Carry;Squat;Dressing;Hygiene/Grooming;Lift;Stand   ? Examination-Participation Restrictions Meal Prep;Cleaning;Laundry   ? Stability/Clinical Decision Making Evolving/Moderate complexity   ? Clinical Decision Making Moderate   ? Rehab Potential Good   ? PT Frequency 2x / week   ? PT Duration Other (comment)   ? PT Treatment/Interventions ADLs/Self Care Home Management;Iontophoresis 4mg /ml Dexamethasone;Gait training;Moist Heat;Traction;Ultrasound;Electrical Stimulation;Cryotherapy;Neuromuscular re-education;Manual techniques;Balance training;Therapeutic exercise;Dry needling;Passive range of motion;Therapeutic activities;Functional mobility training;Patient/family education;Vasopneumatic Device   ? PT Next Visit Plan needs an interpreter, get # from front desk. big language barrier. How was ionto?   ? PT Home Exercise Plan [id number]   ? Consulted and Agree with Plan of Care Patient   ? ?  ?  ? ?  ? ? ?Patient will benefit from skilled therapeutic intervention in order to improve the following deficits and impairments:  Abnormal gait, Decreased coordination, Difficulty walking, Increased fascial restricitons, Impaired UE functional use, Pain, Decreased activity tolerance, Decreased balance, Improper body mechanics, Postural dysfunction, Decreased strength, Decreased mobility ? ?Visit Diagnosis: ?Acute bilateral low back pain, unspecified whether sciatica present ? ?Muscle weakness (generalized) ? ?Other abnormalities of gait and mobility ? ?Acute pain of left shoulder ? ? ? ? ?Problem List ?Patient Active Problem List  ? Diagnosis Date Noted  ? Spondylolisthesis, lumbar region 11/15/2019  ?  Class: Chronic  ? Spinal stenosis, lumbar region with neurogenic claudication 11/15/2019  ?  Class: Chronic  ? S/P lumbar spinal fusion 11/15/2019  ? Abdominal pain 02/23/2018  ? Fever  02/23/2018  ? Generalized body aches 02/23/2018  ? Viral illness 02/23/2018  ? Acute bronchitis 02/26/2017  ? ?Parveen Freehling U PT, DPT, PN2  ? ?Supplemental Physical Therapist ?[place]  ? ? ? ? ?[place] ?[facility] ?[id number] ?[place], [place], [address] ?Phone: [phone]   Fax:  [phone] ? ?Name:  Brinn Kimrey ?MRN: [id number] ?Date of Birth: 25-Sep-1960 ? ? ? ?

## 2021-11-22 ENCOUNTER — Ambulatory Visit: Payer: BC Managed Care – PPO | Admitting: Physical Therapy

## 2021-11-26 ENCOUNTER — Encounter: Payer: Self-pay | Admitting: Physical Therapy

## 2021-11-26 ENCOUNTER — Ambulatory Visit: Payer: BC Managed Care – PPO | Admitting: Physical Therapy

## 2021-11-26 DIAGNOSIS — M6281 Muscle weakness (generalized): Secondary | ICD-10-CM

## 2021-11-26 DIAGNOSIS — R2689 Other abnormalities of gait and mobility: Secondary | ICD-10-CM

## 2021-11-26 DIAGNOSIS — M545 Low back pain, unspecified: Secondary | ICD-10-CM

## 2021-11-26 DIAGNOSIS — R262 Difficulty in walking, not elsewhere classified: Secondary | ICD-10-CM | POA: Diagnosis not present

## 2021-11-26 DIAGNOSIS — R600 Localized edema: Secondary | ICD-10-CM | POA: Diagnosis not present

## 2021-11-26 DIAGNOSIS — M25512 Pain in left shoulder: Secondary | ICD-10-CM

## 2021-11-26 NOTE — Therapy (Signed)
?[facility] ?[id number] ?[place], [place], [address] ?Phone: [phone]   Fax:  [phone] ? ?Physical Therapy Treatment ? ?Patient Details  ?Name: Shannon BorgBenedicta Williamson ?MRN: [id number] ?Date of Birth: 08/13/1960 ?Referring Provider (PT): Vira BrownsNitka, James E ? ? ?Encounter Date: 11/26/2021 ? ? PT End of Session - 11/26/21 1541   ? ? Visit Number 7   ? Date for PT Re-Evaluation 12/26/21   ? PT Start Time 1500   ? PT Stop Time 1540   ? PT Time Calculation (min) 40 min   ? Activity Tolerance Patient tolerated treatment well   ? Behavior During Therapy [facility] for tasks assessed/performed   ? ?  ?  ? ?  ? ? ?Past Medical History:  ?Diagnosis Date  ? Allergy   ? Arthritis   ? Hypertension   ? ? ?Past Surgical History:  ?Procedure Laterality Date  ? CARPAL TUNNEL RELEASE Right 06/21/2013  ? Procedure: RIGHT CARPAL TUNNEL RELEASE;  Surgeon: Nicki ReaperGary R Kuzma, MD;  Location: [facility];  Service: Orthopedics;  Laterality: Right;  ? NO PAST SURGERIES    ? ? ?There were no vitals filed for this visit. ? ? Subjective Assessment - 11/26/21 1501   ? ? Subjective Patient reports continued weakness in B lower legs. She feels like her knees may buckle at times. She is not sure if the patch helped with her shoulder pain.   ? How long can you sit comfortably? N/A   ? How long can you stand comfortably? Standing still causes legs to feel like they will buckle   ? How long can you walk comfortably? No pain, but her weakness causes falls and near falls as her legs give out.   ? Diagnostic tests X ray of back (-), X ray of L shoulder shows narrowing of subacromial joint space.   ? Patient Stated Goals Increased leg strength, decresed shoulder pain.   ? Currently in Pain? No/denies   ? Pain Onset In the past 7 days   ? ?  ?  ? ?  ? ? ? ? ? ? ? ? ? ? ? ? ? ? ? ? ? ? ? ? [facility] Adult PT Treatment/Exercise - 11/26/21 0001   ? ?  ? Lumbar Exercises: Stretches  ? Other Lumbar Stretch Exercise  Crossover and return with alternating feet.   ?  ? Lumbar Exercises: Aerobic  ? Nustep L4 x 6 minutes, BLE.   ?  ? Lumbar Exercises: Machines for Strengthening  ? Cybex Knee Extension 5#, 2 x 10 reps   ? Cybex Knee Flexion 20#, 2 x 10 reps   ?  ? Lumbar Exercises: Standing  ? Row Strengthening;Both;20 reps   ? Theraband Level (Row) Level 3 (Green)   ? Shoulder Extension Strengthening;Both;20 reps   ? Theraband Level (Shoulder Extension) Level 3 (Green)   ? Other Standing Lumbar Exercises B shoulder ER against Green Tband 2 x 20.   ? Other Standing Lumbar Exercises Balance training including alternate taps on cones on the floor, progressing to 2, then 3 in a row, occasional min A for balance. Also side stp onto/off Airex pad x 10 reps each way, CGa.   ?  ? Lumbar Exercises: Supine  ? Bridge Non-compliant   Able to complete B bridge as well as U bridge with either limb.  ? Straight Leg Raise 10 reps;1 second   ? Straight Leg Raises Limitations also completed 10 reps with  leg in ER.   ? ?  ?  ? ?  ? ? ? ? ? ? ? ? ? ? ? ? PT Short Term Goals - 11/26/21 1510   ? ?  ? PT SHORT TERM GOAL #1  ? Title I with basic HEP   ? Baseline iniitated   ? Time 1   ? Period Weeks   ? Status Achieved   ? Target Date 11/14/21   ?  ? PT SHORT TERM GOAL #2  ? Title Patient will be able to perform L shoulder elevation to at least 90 degrees with pain < 4/10   ? Baseline 90 degrees with pain to 2/10   ? Time 1   ? Period Weeks   ? Status Achieved   ? Target Date 11/14/21   ?  ? PT SHORT TERM GOAL #3  ? Title Patient will clear hips from table while performing U bridge on solid surface.   ? Baseline Improved elevatrion, but still unable to clear hips.   ? Time 1   ? Period Weeks   ? Status Achieved   ? Target Date 11/14/21   ? ?  ?  ? ?  ? ? ? ? PT Long Term Goals - 11/08/21 1646   ? ?  ? PT LONG TERM GOAL #1  ? Title I with final HEP   ? Status On-going   ?  ? PT LONG TERM GOAL #2  ? Title Patient will complete 5xSTS in <20 seconds to  demosntrate improved BLE strength.   ? Status On-going   ? ?  ?  ? ?  ? ? ? ? ? ? ? ? Plan - 11/26/21 1519   ? ? Clinical Impression Statement Patient reports the Ionto did not change her pain much. However, she was able to elevate arm to 90 degreesiwth <3/10 pain. She reports that her legs don't hurt, but continue to feel weak and she is fearful that her knees may buckle on her. Treatment addressed postural strength to continue to improve her shoulder pain, and BLE strength, especially gluts, quads, HS, to improve her stability in standing and walking.   ? Personal Factors and Comorbidities Fitness;Comorbidity 1   ? Comorbidities Lumbar fusion.   ? Examination-Activity Limitations Locomotion Level;Transfers;Reach Overhead;Bed Mobility;Carry;Squat;Dressing;Hygiene/Grooming;Lift;Stand   ? Examination-Participation Restrictions Meal Prep;Cleaning;Laundry   ? Stability/Clinical Decision Making Evolving/Moderate complexity   ? Clinical Decision Making Moderate   ? Rehab Potential Good   ? PT Frequency 2x / week   ? PT Duration 4 weeks   ? PT Treatment/Interventions ADLs/Self Care Home Management;Iontophoresis 4mg /ml Dexamethasone;Gait training;Moist Heat;Traction;Ultrasound;Electrical Stimulation;Cryotherapy;Neuromuscular re-education;Manual techniques;Balance training;Therapeutic exercise;Dry needling;Passive range of motion;Therapeutic activities;Functional mobility training;Patient/family education;Vasopneumatic Device   ? PT Next Visit Plan Re-assess-has Dr appointment 5/15.   ? PT Home Exercise Plan 6/15   ? Consulted and Agree with Plan of Care Patient   ? ?  ?  ? ?  ? ? ?Patient will benefit from skilled therapeutic intervention in order to improve the following deficits and impairments:  Abnormal gait, Decreased coordination, Difficulty walking, Increased fascial restricitons, Impaired UE functional use, Pain, Decreased activity tolerance, Decreased balance, Improper body mechanics, Postural dysfunction,  Decreased strength, Decreased mobility ? ?Visit Diagnosis: ?Acute bilateral low back pain, unspecified whether sciatica present ? ?Muscle weakness (generalized) ? ?Other abnormalities of gait and mobility ? ?Acute pain of left shoulder ? ?Difficulty in walking, not elsewhere classified ? ?Localized edema ? ? ? ? ?Problem List ?Patient  Active Problem List  ? Diagnosis Date Noted  ? Spondylolisthesis, lumbar region 11/15/2019  ?  Class: Chronic  ? Spinal stenosis, lumbar region with neurogenic claudication 11/15/2019  ?  Class: Chronic  ? S/P lumbar spinal fusion 11/15/2019  ? Abdominal pain 02/23/2018  ? Fever 02/23/2018  ? Generalized body aches 02/23/2018  ? Viral illness 02/23/2018  ? Acute bronchitis 02/26/2017  ? ? ?Iona Beard, DPT ?11/26/2021, 3:45 PM ? ?[place] ?[facility] ?[address] ?[place], [place], [address] ?Phone: [phone]   Fax:  [phone] ? ?Name: Jireh Vinas ?MRN: [id number] ?Date of Birth: 1960/11/29 ? ? ? ?

## 2021-11-29 ENCOUNTER — Encounter: Payer: Self-pay | Admitting: Physical Therapy

## 2021-11-29 ENCOUNTER — Ambulatory Visit: Payer: BC Managed Care – PPO | Admitting: Physical Therapy

## 2021-11-29 DIAGNOSIS — R262 Difficulty in walking, not elsewhere classified: Secondary | ICD-10-CM | POA: Diagnosis not present

## 2021-11-29 DIAGNOSIS — M25512 Pain in left shoulder: Secondary | ICD-10-CM

## 2021-11-29 DIAGNOSIS — R2689 Other abnormalities of gait and mobility: Secondary | ICD-10-CM

## 2021-11-29 DIAGNOSIS — M6281 Muscle weakness (generalized): Secondary | ICD-10-CM | POA: Diagnosis not present

## 2021-11-29 DIAGNOSIS — R600 Localized edema: Secondary | ICD-10-CM

## 2021-11-29 DIAGNOSIS — M545 Low back pain, unspecified: Secondary | ICD-10-CM

## 2021-11-29 NOTE — Therapy (Signed)
[place] ?[facility] ?[address] ?[place], [place], [address] ?Phone: [phone]   Fax:  [phone] ? ?Physical Therapy Treatment ? ?Patient Details  ?Name: Shannon Williamson ?MRN: [id number] ?Date of Birth: 10/06/1960 ?Referring Provider (PT): Vira Browns E ? ? ?Encounter Date: 11/29/2021 ? ? PT End of Session - 11/29/21 1618   ? ? Visit Number 8   ? Date for PT Re-Evaluation 12/26/21   ? PT Start Time 1532   ? PT Stop Time 1610   ? PT Time Calculation (min) 38 min   ? Activity Tolerance Patient tolerated treatment well   ? Behavior During Therapy [facility] for tasks assessed/performed   ? ?  ?  ? ?  ? ? ?Past Medical History:  ?Diagnosis Date  ? Allergy   ? Arthritis   ? Hypertension   ? ? ?Past Surgical History:  ?Procedure Laterality Date  ? CARPAL TUNNEL RELEASE Right 06/21/2013  ? Procedure: RIGHT CARPAL TUNNEL RELEASE;  Surgeon: Nicki Reaper, MD;  Location: [facility];  Service: Orthopedics;  Laterality: Right;  ? NO PAST SURGERIES    ? ? ?There were no vitals filed for this visit. ? ? Subjective Assessment - 11/29/21 1536   ? ? Subjective Doing OK today. The patch for my shoulder was just OK., I don't know if it helped. Back is OK, legs are just so so. I think maybe I'm getting a little better.   ? Currently in Pain? Other (Comment)   "I don't have too much pain right now, only leg and shoulder are a problem for me now; not too much pain"  ? Pain Score --   difficult to rate  ? Pain Location Other (Comment)   "everything"  ? ?  ?  ? ?  ? ? ? ? ? [facility] PT Assessment - 11/29/21 0001   ? ?  ? Assessment  ? Medical Diagnosis L shoulder impingement, BLE and trunk weakness   ? Referring Provider (PT) Kerrin Champagne   ?  ? Precautions  ? Precaution Comments do not lift more than 10#.   ?  ? Balance Screen  ? Has the patient fallen in the past 6 months No   ?  ? Home Environment  ? Living Environment Private residence   ?  ? Prior Function  ? Level of  Independence Independent   ?  ? Cognition  ? Overall Cognitive Status Within Functional Limits for tasks assessed   ?  ? AROM  ? Overall AROM Comments B shoulder flexion and ABD full ROM, FER T3-T4, FIR barely to L5 B   ? Lumbar Flexion WNL   ? Lumbar Extension WNL   ? Lumbar - Right Side Bend WNL   ? Lumbar - Left Side Bend WNL   ?  ? Strength  ? Strength Assessment Site Knee;Ankle   ? Right Hip Flexion 3/5   ? Right Hip Extension 3-/5   ? Right Hip ABduction 3+/5   ? Left Hip Flexion 3/5   ? Left Hip Extension 3-/5   ? Left Hip ABduction 4-/5   ? Right/Left Knee Right;Left   ? Right Knee Flexion 2+/5   ? Right Knee Extension 4/5   ? Left Knee Flexion 2+/5   ? Left Knee Extension 4/5   ? Right/Left Ankle Left;Right   ? Right Ankle Dorsiflexion 4+/5   ? Left Ankle Dorsiflexion 4+/5   ?  ? Flexibility  ? Hamstrings  WNL   ? Quadriceps mod tight   ?  ? Standardized Balance Assessment  ? Five times sit to stand comments  19.5   ?  ? Timed Up and Go Test  ? Normal TUG (seconds) 14   ? ?  ?  ? ?  ? ? ? ? ? ? ? ? ? ? ? ? ? ? ? ? [facility] Adult PT Treatment/Exercise - 11/29/21 0001   ? ?  ? Lumbar Exercises: Aerobic  ? Nustep L4 x 6 minutes, BLE.   ?  ? Lumbar Exercises: Standing  ? Other Standing Lumbar Exercises mini squats 1x10   ?  ? Lumbar Exercises: Seated  ? Sit to Stand 10 reps   ?  ? Lumbar Exercises: Supine  ? Bridge 15 reps;1 second   ? Straight Leg Raise 15 reps;1 second   ? Straight Leg Raises Limitations SLRs with ER 1x15 B   ? ?  ?  ? ?  ? ? ? ? ? ? ? ? ? ? PT Education - 11/29/21 1617   ? ? Education Details reassessment findings, might benfeit from ongoing  skilled PT services   ? Person(s) Educated Patient   ? Methods Explanation   ? Comprehension Verbalized understanding   ? ?  ?  ? ?  ? ? ? PT Short Term Goals - 11/29/21 1552   ? ?  ? PT SHORT TERM GOAL #1  ? Title I with basic HEP   ? Baseline 5/11- doing every day   ? Time 1   ? Period Weeks   ? Status Achieved   ?  ? PT SHORT TERM GOAL #2  ? Title Patient  will be able to perform L shoulder elevation to at least 90 degrees with pain < 4/10   ? Baseline 90 degrees with pain to 2/10   ? Time 1   ? Period Weeks   ? Status Achieved   ?  ? PT SHORT TERM GOAL #3  ? Title Patient will clear hips from table while performing U bridge on solid surface.   ? Time 1   ? Period Weeks   ? Status On-going   ? ?  ?  ? ?  ? ? ? ? PT Long Term Goals - 11/29/21 1553   ? ?  ? PT LONG TERM GOAL #1  ? Title I with final HEP   ? Time 10   ? Period Weeks   ? Status On-going   ?  ? PT LONG TERM GOAL #2  ? Title Patient will complete 5xSTS in <20 seconds to demosntrate improved BLE strength.   ? Time 10   ? Period Weeks   ? Status Achieved   ?  ? PT LONG TERM GOAL #3  ? Title Patient will complete TUG in < 15 seconds to demosntrate decreased fall risk.   ? Time 10   ? Period Weeks   ? Status Achieved   ?  ? PT LONG TERM GOAL #4  ? Title Patient will ambulate at least 500' with LRAD, demonstrate functional speed, normalized pattern, no unsteadiness.   ? Baseline still slow but improved as compared to eval   ? Time 10   ? Period Weeks   ? Status On-going   ?  ? PT LONG TERM GOAL #5  ? Title Patient will be able to perform her normal daily activities with <3/10 L shoulder pain.   ? Baseline able to do  more with left shoulder without it hurting   ? Time 10   ? Period Weeks   ? Status Achieved   ? ?  ?  ? ?  ? ? ? ? ? ? ? ? Plan - 11/29/21 1618   ? ? Clinical Impression Statement Jordi arrives today doing OK, feels therapy is helping her a bit and tells me she has been able to do more around the house but still having some pain. Got objective measures prior to MD appointment on the fifteenth, she did much better with the TUG and 5x sit to stand but MMT was about the same. Sounds like shoulder pain might be a bit better from her report too. May benefit from continuation of skilled PT if she is agreeable to this plan after seeing MD- she would like to get Dr. Barbaraann Faster thoughts before scheduling  more appointments.   ? Personal Factors and Comorbidities Fitness;Comorbidity 1   ? Comorbidities Lumbar fusion.   ? Examination-Activity Limitations Locomotion Level;Transfers;Reach Overhead;Bed Mobility;Carry;Squat;Dressing;Hygiene/Grooming;Lift;Stand   ? Examination-Participation Restrictions Meal Prep;Cleaning;Laundry   ? Stability/Clinical Decision Making Evolving/Moderate complexity   ? Clinical Decision Making Moderate   ? Rehab Potential Good   ? PT Frequency 2x / week   ? PT Duration 4 weeks   ? PT Treatment/Interventions ADLs/Self Care Home Management;Iontophoresis 4mg /ml Dexamethasone;Gait training;Moist Heat;Traction;Ultrasound;Electrical Stimulation;Cryotherapy;Neuromuscular re-education;Manual techniques;Balance training;Therapeutic exercise;Dry needling;Passive range of motion;Therapeutic activities;Functional mobility training;Patient/family education;Vasopneumatic Device   ? PT Next Visit Plan Wants to talk to MD before making more appts   ? PT Home Exercise Plan   ? ?  ?  ? ?  ? ? ?Patient will benefit from skilled therapeutic intervention in order to improve the following deficits and impairments:  Abnormal gait, Decreased coordination, Difficulty walking, Increased fascial restricitons, Impaired UE functional use, Pain, Decreased activity tolerance, Decreased balance, Improper body mechanics, Postural dysfunction, Decreased strength, Decreased mobility ? ?Visit Diagnosis: ?Acute bilateral low back pain, unspecified whether sciatica present ? ?Muscle weakness (generalized) ? ?Other abnormalities of gait and mobility ? ?Acute pain of left shoulder ? ?Difficulty in walking, not elsewhere classified ? ?Localized edema ? ? ? ? ?Problem List ?Patient Active Problem List  ? Diagnosis Date Noted  ? Spondylolisthesis, lumbar region 11/15/2019  ?  Class: Chronic  ? Spinal stenosis, lumbar region with neurogenic claudication 11/15/2019  ?  Class: Chronic  ? S/P lumbar spinal fusion 11/15/2019  ?  Abdominal pain 02/23/2018  ? Fever 02/23/2018  ? Generalized body aches 02/23/2018  ? Viral illness 02/23/2018  ? Acute bronchitis 02/26/2017  ? ?Jafeth Mustin U PT, DPT, PN2  ? ?Supplemental Physical Therapist ?[facility]

## 2021-12-03 ENCOUNTER — Ambulatory Visit (INDEPENDENT_AMBULATORY_CARE_PROVIDER_SITE_OTHER): Payer: BC Managed Care – PPO | Admitting: Specialist

## 2021-12-03 ENCOUNTER — Encounter: Payer: Self-pay | Admitting: Specialist

## 2021-12-03 VITALS — BP 154/78 | HR 67 | Ht 63.0 in | Wt 167.0 lb

## 2021-12-03 DIAGNOSIS — M5416 Radiculopathy, lumbar region: Secondary | ICD-10-CM | POA: Diagnosis not present

## 2021-12-03 DIAGNOSIS — Z981 Arthrodesis status: Secondary | ICD-10-CM | POA: Diagnosis not present

## 2021-12-03 DIAGNOSIS — R200 Anesthesia of skin: Secondary | ICD-10-CM | POA: Diagnosis not present

## 2021-12-03 DIAGNOSIS — R29898 Other symptoms and signs involving the musculoskeletal system: Secondary | ICD-10-CM | POA: Diagnosis not present

## 2021-12-03 NOTE — Progress Notes (Signed)
? ?  Office Visit Note ?  ?Patient: Shannon Williamson           ?Date of Birth: 10-04-1960           ?MRN: [id number] ?Visit Date: 12/03/2021 ?             ?Requested by: Fleet Contras, MD ?[address] ?[place],  [place] [address] ?PCP: Fleet Contras, MD ? ? ?Assessment & Plan: ?Visit Diagnoses:  ?1. S/P lumbar fusion   ?2. Lumbar radiculopathy, chronic   ?3. Bilateral leg numbness   ?4. Bilateral leg weakness   ? ? ?Plan: Avoid frequent bending and stooping  ?No lifting greater than 10 lbs. ?May use ice or moist heat for pain. ?Weight loss is of benefit. ?Best medication for lumbar disc disease is arthritis medications like motrin, celebrex and naprosyn. ?Exercise is important to improve your indurance and does allow people to function better inspite of back pain. ? Vitamin B Complex one tablet daily ? ?Follow-Up Instructions: No follow-ups on file.  ? ?Orders:  ?No orders of the defined types were placed in this encounter. ? ?No orders of the defined types were placed in this encounter. ? ? ? ? Procedures: ?No procedures performed ? ? ?Clinical Data: ?No additional findings. ? ? ?Subjective: ?Chief Complaint  ?Patient presents with  ? Lower Back - Follow-up  ? ? ?HPI ? ?Review of Systems ? ? ?Objective: ?Vital Signs: BP (!) 154/78 (BP Location: Left Arm, Patient Position: Sitting)   Pulse 67   Ht 5\' 3"  (1.6 m)   Wt 167 lb (75.8 kg)   BMI 29.58 kg/m?  ? ?Physical Exam ? ?Ortho Exam ? ?Specialty Comments:  ?No specialty comments available. ? ?Imaging: ?No results found. ? ? ?PMFS History: ?Patient Active Problem List  ? Diagnosis Date Noted  ? Spondylolisthesis, lumbar region 11/15/2019  ?  Priority: High  ?  Class: Chronic  ? Spinal stenosis, lumbar region with neurogenic claudication 11/15/2019  ?  Priority: High  ?  Class: Chronic  ? S/P lumbar spinal fusion 11/15/2019  ? Abdominal pain 02/23/2018  ? Fever 02/23/2018  ? Generalized body aches 02/23/2018  ? Viral illness 02/23/2018  ? Acute bronchitis  02/26/2017  ? ?Past Medical History:  ?Diagnosis Date  ? Allergy   ? Arthritis   ? Hypertension   ?  ?Family History  ?Problem Relation Age of Onset  ? Breast cancer Neg Hx   ?  ?Past Surgical History:  ?Procedure Laterality Date  ? CARPAL TUNNEL RELEASE Right 06/21/2013  ? Procedure: RIGHT CARPAL TUNNEL RELEASE;  Surgeon: 14/07/2012, MD;  Location: [facility];  Service: Orthopedics;  Laterality: Right;  ? NO PAST SURGERIES    ? ?Social History  ? ?Occupational History  ? Not on file  ?Tobacco Use  ? Smoking status: Never  ? Smokeless tobacco: Never  ?Vaping Use  ? Vaping Use: Never used  ?Substance and Sexual Activity  ? Alcohol use: No  ? Drug use: No  ? Sexual activity: Yes  ? ? ? ? ? ? ?

## 2021-12-03 NOTE — Patient Instructions (Signed)
Plan: Avoid frequent bending and stooping  ?No lifting greater than 10 lbs. ?May use ice or moist heat for pain. ?Weight loss is of benefit. ?Best medication for lumbar disc disease is arthritis medications like motrin, celebrex and naprosyn. ?Exercise is important to improve your indurance and does allow people to function better inspite of back pain. ? Vitamin B Complex one tablet daily ?

## 2021-12-05 DIAGNOSIS — E7849 Other hyperlipidemia: Secondary | ICD-10-CM | POA: Diagnosis not present

## 2021-12-05 DIAGNOSIS — M48062 Spinal stenosis, lumbar region with neurogenic claudication: Secondary | ICD-10-CM | POA: Diagnosis not present

## 2021-12-05 DIAGNOSIS — R7303 Prediabetes: Secondary | ICD-10-CM | POA: Diagnosis not present

## 2021-12-05 DIAGNOSIS — I10 Essential (primary) hypertension: Secondary | ICD-10-CM | POA: Diagnosis not present

## 2022-01-17 ENCOUNTER — Other Ambulatory Visit: Payer: Self-pay | Admitting: Specialist

## 2022-02-04 ENCOUNTER — Ambulatory Visit: Payer: Self-pay

## 2022-02-04 ENCOUNTER — Ambulatory Visit (INDEPENDENT_AMBULATORY_CARE_PROVIDER_SITE_OTHER): Payer: BC Managed Care – PPO | Admitting: Specialist

## 2022-02-04 ENCOUNTER — Encounter: Payer: Self-pay | Admitting: Specialist

## 2022-02-04 VITALS — BP 127/71 | HR 61 | Ht 63.0 in | Wt 167.0 lb

## 2022-02-04 DIAGNOSIS — Z981 Arthrodesis status: Secondary | ICD-10-CM | POA: Diagnosis not present

## 2022-02-04 NOTE — Progress Notes (Signed)
   Office Visit Note   Patient: Shannon Williamson           Date of Birth: Mar 23, 1961           MRN: [id number] Visit Date: 02/04/2022              Requested by: Fleet Contras, MD [address] [place],  [place] [address] PCP: Fleet Contras, MD   Assessment & Plan: Visit Diagnoses:  1. S/P lumbar fusion     Plan: Avoid bending, stooping and avoid lifting weights greater than 10 lbs. Avoid prolong standing and walking. Avoid frequent bending and stooping  No lifting greater than 10 lbs. May use ice or moist heat for pain. Weight loss is of benefit. Handicap license is approved. Dr. Parlier Blas secretary/Assistant will call to arrange for left leg EMGs and NCV for assessing for a residual left  L4 radiculopathy.    Follow-Up Instructions: No follow-ups on file.   Orders:  Orders Placed This Encounter  Procedures   XR Lumbar Spine 2-3 Views   No orders of the defined types were placed in this encounter.     Procedures: No procedures performed   Clinical Data: No additional findings.   Subjective: Chief Complaint  Patient presents with   Lower Back - Follow-up, Pain    HPI  Review of Systems   Objective: Vital Signs: BP 127/71 (BP Location: Left Arm, Patient Position: Sitting)   Pulse 61   Ht 5\' 3"  (1.6 m)   Wt 167 lb (75.8 kg)   BMI 29.58 kg/m   Physical Exam  Ortho Exam  Specialty Comments:  No specialty comments available.  Imaging: No results found.   PMFS History: Patient Active Problem List   Diagnosis Date Noted   Spondylolisthesis, lumbar region 11/15/2019    Priority: High    Class: Chronic   Spinal stenosis, lumbar region with neurogenic claudication 11/15/2019    Priority: High    Class: Chronic   S/P lumbar spinal fusion 11/15/2019   Abdominal pain 02/23/2018   Fever 02/23/2018   Generalized body aches 02/23/2018   Viral illness 02/23/2018   Acute bronchitis 02/26/2017   Past Medical History:  Diagnosis Date    Allergy    Arthritis    Hypertension     Family History  Problem Relation Age of Onset   Breast cancer Neg Hx     Past Surgical History:  Procedure Laterality Date   CARPAL TUNNEL RELEASE Right 06/21/2013   Procedure: RIGHT CARPAL TUNNEL RELEASE;  Surgeon: 14/07/2012, MD;  Location: [facility];  Service: Orthopedics;  Laterality: Right;   NO PAST SURGERIES     Social History   Occupational History   Not on file  Tobacco Use   Smoking status: Never   Smokeless tobacco: Never  Vaping Use   Vaping Use: Never used  Substance and Sexual Activity   Alcohol use: No   Drug use: No   Sexual activity: Yes

## 2022-02-04 NOTE — Patient Instructions (Signed)
Avoid bending, stooping and avoid lifting weights greater than 10 lbs. Avoid prolong standing and walking. Avoid frequent bending and stooping  No lifting greater than 10 lbs. May use ice or moist heat for pain. Weight loss is of benefit. Handicap license is approved. Dr.  Blas secretary/Assistant will call to arrange for left leg EMGs and NCV for assessing for a residual left  L4 radiculopathy.

## 2022-02-08 ENCOUNTER — Telehealth: Payer: Self-pay | Admitting: Physical Medicine and Rehabilitation

## 2022-02-08 NOTE — Telephone Encounter (Signed)
Pt's husband called to set an referral appt for pt. Please call pt at [phone].

## 2022-02-27 ENCOUNTER — Encounter (HOSPITAL_COMMUNITY): Payer: Self-pay

## 2022-02-27 ENCOUNTER — Emergency Department (HOSPITAL_COMMUNITY)
Admission: EM | Admit: 2022-02-27 | Discharge: 2022-02-27 | Disposition: A | Discharge status: Home / Self Care | Payer: BC Managed Care – PPO | Attending: Emergency Medicine | Admitting: Emergency Medicine

## 2022-02-27 ENCOUNTER — Emergency Department (HOSPITAL_COMMUNITY): Payer: BC Managed Care – PPO

## 2022-02-27 ENCOUNTER — Other Ambulatory Visit: Payer: Self-pay

## 2022-02-27 DIAGNOSIS — R29818 Other symptoms and signs involving the nervous system: Secondary | ICD-10-CM | POA: Diagnosis not present

## 2022-02-27 DIAGNOSIS — Z20822 Contact with and (suspected) exposure to covid-19: Secondary | ICD-10-CM | POA: Insufficient documentation

## 2022-02-27 DIAGNOSIS — G4489 Other headache syndrome: Secondary | ICD-10-CM | POA: Diagnosis not present

## 2022-02-27 DIAGNOSIS — I639 Cerebral infarction, unspecified: Secondary | ICD-10-CM | POA: Insufficient documentation

## 2022-02-27 DIAGNOSIS — R531 Weakness: Secondary | ICD-10-CM | POA: Diagnosis not present

## 2022-02-27 DIAGNOSIS — Z79899 Other long term (current) drug therapy: Secondary | ICD-10-CM | POA: Diagnosis not present

## 2022-02-27 DIAGNOSIS — R42 Dizziness and giddiness: Secondary | ICD-10-CM | POA: Insufficient documentation

## 2022-02-27 DIAGNOSIS — R519 Headache, unspecified: Secondary | ICD-10-CM | POA: Insufficient documentation

## 2022-02-27 DIAGNOSIS — I10 Essential (primary) hypertension: Secondary | ICD-10-CM | POA: Diagnosis not present

## 2022-02-27 DIAGNOSIS — R739 Hyperglycemia, unspecified: Secondary | ICD-10-CM | POA: Diagnosis not present

## 2022-02-27 LAB — CBC
HCT: 38.2 % (ref 36.0–46.0)
Hemoglobin: 12.8 g/dL (ref 12.0–15.0)
MCH: 29.7 pg (ref 26.0–34.0)
MCHC: 33.5 g/dL (ref 30.0–36.0)
MCV: 88.6 fL (ref 80.0–100.0)
Platelets: 213 10*3/uL (ref 150–400)
RBC: 4.31 MIL/uL (ref 3.87–5.11)
RDW: 13.9 % (ref 11.5–15.5)
WBC: 5.2 10*3/uL (ref 4.0–10.5)
nRBC: 0 % (ref 0.0–0.2)

## 2022-02-27 LAB — BASIC METABOLIC PANEL
Anion gap: 10 (ref 5–15)
BUN: 17 mg/dL (ref 6–20)
CO2: 22 mmol/L (ref 22–32)
Calcium: 9.3 mg/dL (ref 8.9–10.3)
Chloride: 109 mmol/L (ref 98–111)
Creatinine, Ser: 0.94 mg/dL (ref 0.44–1.00)
GFR, Estimated: >60 mL/min (ref >60)
Glucose, Bld: 123 mg/dL — ABNORMAL HIGH (ref 70–99)
Potassium: 3.6 mmol/L (ref 3.5–5.1)
Sodium: 141 mmol/L (ref 135–145)

## 2022-02-27 LAB — URINALYSIS, ROUTINE W REFLEX MICROSCOPIC
Bilirubin Urine: NEGATIVE
Glucose, UA: NEGATIVE mg/dL
Hgb urine dipstick: NEGATIVE
Ketones, ur: NEGATIVE mg/dL
Leukocytes,Ua: NEGATIVE
Nitrite: NEGATIVE
Protein, ur: NEGATIVE mg/dL
Specific Gravity, Urine: 1.01 (ref 1.005–1.030)
pH: 8 (ref 5.0–8.0)

## 2022-02-27 LAB — RESP PANEL BY RT-PCR (FLU A&B, COVID) ARPGX2
Influenza A by PCR: NEGATIVE
Influenza B by PCR: NEGATIVE
SARS Coronavirus 2 by RT PCR: NEGATIVE

## 2022-02-27 LAB — CBG MONITORING, ED: Glucose-Capillary: 130 mg/dL — ABNORMAL HIGH (ref 70–99)

## 2022-02-27 MED ORDER — MECLIZINE HCL 25 MG PO TABS
25.0000 mg | ORAL_TABLET | Freq: Once | ORAL | Status: AC
  Administered 2022-02-27: 25 mg via ORAL
  Filled 2022-02-27: qty 1

## 2022-02-27 MED ORDER — MECLIZINE HCL 25 MG PO TABS: 25.0000 mg | ORAL_TABLET | Freq: Three times a day (TID) | ORAL | 0 refills | Status: AC | PRN

## 2022-02-27 NOTE — ED Triage Notes (Signed)
Pt arrives [facility] from home. Pt c/o dizziness, generalized weakness, numbness to bilateral legs, and heaviness in her head after waking up this morning.

## 2022-02-27 NOTE — ED Notes (Signed)
Pt informed a urine sample is still needed. Pt instructed to call out when she has the urge to go!

## 2022-02-27 NOTE — ED Notes (Signed)
Pt to CT

## 2022-02-27 NOTE — ED Provider Notes (Signed)
[facility]-EMERGENCY DEPT Provider Note   CSN: [id number] Arrival date & time: 02/27/22  1056     History  Chief Complaint  Patient presents with   Dizziness   Headache    Shannon Williamson is a 61 y.o. female.  HPI 61 year old female presents today complaining of dizziness.  She states that symptoms began around 9 AM this morning.  Describes them as some spinning sensation and some headache.  She denies any difficulty walking, speaking, lateralized deficit.  Family reports she had similar symptoms about 3 months ago and is unclear whether or not she had workup at that time.  She is not on any blood thinners.  She has not had any recent trauma.     Home Medications Prior to Admission medications   Medication Sig Start Date End Date Taking? Authorizing Provider  hydrochlorothiazide (MICROZIDE) 12.5 MG capsule Take 12.5 mg by mouth daily.    [provider]  lisinopril (PRINIVIL,ZESTRIL) 20 MG tablet Take 20 mg by mouth 2 (two) times daily.    [provider]  loratadine (CLARITIN) 10 MG tablet Take 10 mg by mouth daily as needed for allergies. Patient not taking: Reported on 03/14/2021    [provider]  meloxicam (MOBIC) 15 MG tablet TAKE 1 TABLET (15 MG TOTAL) BY MOUTH DAILY. 01/17/22 01/17/23  Kerrin Champagne, MD  metoprolol succinate (TOPROL-XL) 100 MG 24 hr tablet Take 100 mg by mouth daily. Take with or immediately following a meal.    [provider]  Multiple Vitamins-Minerals (MULTIVITAMIN WITH MINERALS) tablet Take 1 tablet by mouth daily. Woman    [provider]  pregabalin (LYRICA) 75 MG capsule Take 75 mg by mouth 3 (three) times daily. 09/02/21   [provider]      Allergies    Patient has no known allergies.    Review of Systems   Review of Systems  Physical Exam Updated Vital Signs BP (!) 146/57   Pulse 61   Temp 98.3 F (36.8 C) (Oral)   Resp (!) 8   Ht 1.6 m (5\' 3" )   SpO2 99%    BMI 29.58 kg/m  Physical Exam Vitals and nursing note reviewed.  Constitutional:      Appearance: She is well-developed.  HENT:     Head: Normocephalic.     Mouth/Throat:     Mouth: Mucous membranes are moist.  Eyes:     Extraocular Movements: Extraocular movements intact.  Cardiovascular:     Rate and Rhythm: Normal rate and regular rhythm.     Heart sounds: Normal heart sounds. No murmur heard. Pulmonary:     Effort: Pulmonary effort is normal.     Breath sounds: Normal breath sounds.  Abdominal:     General: Bowel sounds are normal.  Musculoskeletal:        General: Normal range of motion.     Cervical back: Normal range of motion.  Skin:    General: Skin is warm and dry.     Capillary Refill: Capillary refill takes less than 2 seconds.  Neurological:     Mental Status: She is alert.     Cranial Nerves: No cranial nerve deficit.     Sensory: No sensory deficit.     Motor: No weakness.     Coordination: Coordination normal.     Comments: Patient's heel-to-shin is smaller but do not appear to have any ataxia     ED Results / Procedures / Treatments   Labs (  all labs ordered are listed, but only abnormal results are displayed) Labs Reviewed  BASIC METABOLIC PANEL - Abnormal; Notable for the following components:      Result Value   Glucose, Bld 123 (*)    All other components within normal limits  URINALYSIS, ROUTINE W REFLEX MICROSCOPIC - Abnormal; Notable for the following components:   Color, Urine STRAW (*)    All other components within normal limits  CBG MONITORING, ED - Abnormal; Notable for the following components:   Glucose-Capillary 130 (*)    All other components within normal limits  RESP PANEL BY RT-PCR (FLU A&B, COVID) ARPGX2  CBC    EKG None  Radiology CT HEAD WO CONTRAST ( )  Result Date: 02/27/2022 CLINICAL DATA:  Headache, new or worsening (Age >= 50y) EXAM: CT HEAD WITHOUT CONTRAST TECHNIQUE: Contiguous axial images were obtained from  the base of the skull through the vertex without intravenous contrast. RADIATION DOSE REDUCTION: This exam was performed according to the departmental dose-optimization program which includes automated exposure control, adjustment of the mA and/or kV according to patient size and/or use of iterative reconstruction technique. COMPARISON:  None Available. FINDINGS: Brain: No evidence of acute infarction, hemorrhage, hydrocephalus, extra-axial collection or mass lesion/mass effect. Vascular: No hyperdense vessel. Skull: No acute fracture. Sinuses/Orbits: Clear sinuses.  No acute orbital findings. Other: No mastoid effusions. IMPRESSION: No evidence of acute intracranial abnormality. Electronically Signed   By: Feliberto Harts M.D.   On: 02/27/2022 12:10    Procedures Procedures    Medications Ordered in ED Medications - No data to display  ED Course/ Medical Decision Making/ A&P Clinical Course as of 02/27/22 1557  Wed Feb 27, 2022  1556 Basic metabolic panel reviewed interpreted within normal limits with exception of mild hyperglycemia [DR]  1556 CBC Basic metabolic panel reviewed interpreted within normal limit [DR]    Clinical Course User Index [DR] Margarita Grizzle, MD                           Medical Decision Making 61 year old female presents today with onset of dizziness this a.m. with no focal neurological. Labs reviewed and essentially within normal limits CT obtained no evidence of acute abnormality MRI of head is pending Care discussed with Dr. Shirlee Limerick and she will disposition after MRI obtained   Amount and/or Complexity of Data Reviewed Labs: ordered. Decision-making details documented in ED Course. Radiology: ordered and independent interpretation performed. Decision-making details documented in ED Course.           Final Clinical Impression(s) / ED Diagnoses Final diagnoses:  Dizziness    Rx / DC Orders ED Discharge Orders     None         Margarita Grizzle, MD 02/27/22 1557

## 2022-02-27 NOTE — ED Provider Notes (Signed)
Pt was signed out by Dr. Rosalia Hammers pending MRI.  MRI films reviewed by me.  I agree with the radiologist.  MRI:  IMPRESSION:  Normal brain MRI.  No acute abnormality.   Sx consistent with vertigo.  She is started on antivert.  She is able to ambulate.  She is stable for d/c.  She is to return if worse.  F/u with pcp.     Jacalyn Lefevre, MD 02/27/22 1756

## 2022-02-27 NOTE — ED Notes (Signed)
Pt to mri 

## 2022-02-27 NOTE — ED Provider Triage Note (Signed)
Emergency Medicine Provider Triage Evaluation Note  Shannon Williamson , a 61 y.o. female  was evaluated in triage.  Pt complains of headache, dizziness. Denies any blurry vision. Reports that her head felt heavy.   Review of Systems  Positive:  Negative:   Physical Exam  BP (!) 178/79 (BP Location: Left Arm)   Pulse 73   Temp 98.3 F (36.8 C) (Oral)   Resp 18   Ht 5\' 3"  (1.6 m)   SpO2 97%   BMI 29.58 kg/m  Gen:   Awake, no distress   Resp:  Normal effort  MSK:   Moves extremities without difficulty  Other:  Cranial nerves grossly intact. PERRL.  Medical Decision Making  Medically screening exam initiated at 11:27 AM.  Appropriate orders placed.  Shannon Williamson was informed that the remainder of the evaluation will be completed by another provider, this initial triage assessment does not replace that evaluation, and the importance of remaining in the ED until their evaluation is complete.  Will order head CT, labs, and COVID   , Achille Rich 02/27/22 1129

## 2022-02-27 NOTE — ED Notes (Signed)
Patient was able to go to the bathroom for a urine sample   she completely missed the hat for the specimen

## 2022-02-27 NOTE — ED Notes (Signed)
Pt walked with tech , she walked without assistance and she tolerated the walk well. She stated that she was pain in knees but overall she did well.

## 2022-03-06 ENCOUNTER — Ambulatory Visit (INDEPENDENT_AMBULATORY_CARE_PROVIDER_SITE_OTHER): Payer: BC Managed Care – PPO | Admitting: Physical Medicine and Rehabilitation

## 2022-03-06 ENCOUNTER — Encounter: Payer: Self-pay | Admitting: Physical Medicine and Rehabilitation

## 2022-03-06 DIAGNOSIS — R202 Paresthesia of skin: Secondary | ICD-10-CM | POA: Diagnosis not present

## 2022-03-06 NOTE — Progress Notes (Signed)
Pt state left leg numbness and weakness. Pt state walking makes the pain worse. Pt state she takes pain meds to help ease her pain.  Numeric Pain Rating Scale and Functional Assessment Average Pain 7   In the last MONTH (on 0-10 scale) has pain interfered with the following?  1. General activity like being  able to carry out your everyday physical activities such as walking, climbing stairs, carrying groceries, or moving a chair?  Rating(8)   +Driver, -BT,

## 2022-03-07 DIAGNOSIS — H8113 Benign paroxysmal vertigo, bilateral: Secondary | ICD-10-CM | POA: Diagnosis not present

## 2022-03-07 DIAGNOSIS — I10 Essential (primary) hypertension: Secondary | ICD-10-CM | POA: Diagnosis not present

## 2022-03-07 DIAGNOSIS — R7303 Prediabetes: Secondary | ICD-10-CM | POA: Diagnosis not present

## 2022-03-07 DIAGNOSIS — F4323 Adjustment disorder with mixed anxiety and depressed mood: Secondary | ICD-10-CM | POA: Diagnosis not present

## 2022-03-08 NOTE — Progress Notes (Unsigned)
Shannon Williamson - 61 y.o. female MRN [id number]  Date of birth: [birth year]  Office Visit Note: Visit Date: 03/06/2022 PCP: Fleet Contras, MD Referred by: Kerrin Champagne, MD  Subjective: Chief Complaint  Patient presents with   Left Leg - Pain   HPI:  Shannon Williamson is a 61 y.o. female who comes in today at the request of Dr. Vira Browns for electrodiagnostic study of the Left lower extremity.  He is status post transforaminal lumbar interbody fusion on the left at L4-5 on 11/15/2019 by Dr. Vira Browns.  He initially had some reduction in pain in the lower back and both legs with the surgery.  He reports over the last year worsening symptoms particular on the left side with referral in the leg.  He has had physical therapy.  He has had medication management.  MRI prior to the surgery showed severe stenosis of the central canal lateral recesses at L4-5 with listhesis.  There has not been any subsequent MRI imaging.  He reports numbness and weakness in general in a nondermatomal fashion.  No foot drop.  Dr. Otelia Sergeant specifically questions to L4 radiculopathy.   ROS Otherwise per HPI.  Assessment & Plan: Visit Diagnoses:    ICD-10-CM   1. Paresthesia of skin  R20.2 NCV with EMG (electromyography)      Plan: No additional findings.   Meds & Orders: No orders of the defined types were placed in this encounter.   Orders Placed This Encounter  Procedures   NCV with EMG (electromyography)    Follow-up: Return in about 2 weeks (around 03/20/2022) for Vira Browns, MD.   Procedures: No procedures performed      Clinical History: No specialty comments available.     Objective:  VS:  HT:    WT:   BMI:     BP:   HR: bpm  TEMP: ( )  RESP:  Physical Exam Vitals and nursing note reviewed.  Constitutional:      General: She is not in acute distress.    Appearance: Normal appearance. She is not ill-appearing.  HENT:     Head: Normocephalic and atraumatic.     Right Ear:  External ear normal.     Left Ear: External ear normal.  Eyes:     Extraocular Movements: Extraocular movements intact.  Cardiovascular:     Rate and Rhythm: Normal rate.     Pulses: Normal pulses.  Pulmonary:     Effort: Pulmonary effort is normal. No respiratory distress.  Abdominal:     General: There is no distension.     Palpations: Abdomen is soft.  Musculoskeletal:        General: Tenderness present.     Cervical back: Neck supple.     Right lower leg: No edema.     Left lower leg: No edema.     Comments: Patient has good distal strength with no pain over the greater trochanters.  No clonus or focal weakness.  There appears to be normal muscle bulk bilaterally with only mild intrinsic foot muscular atrophy bilaterally which could be normal.  There is no swelling color changes or allodynia noted.  Skin:    Findings: No erythema, lesion or rash.  Neurological:     General: No focal deficit present.     Mental Status: She is alert and oriented to person, place, and time.     Cranial Nerves: Cranial nerve deficit present.     Sensory: No sensory deficit.  Motor: No weakness or abnormal muscle tone.     Coordination: Coordination normal.     Gait: Gait abnormal.  Psychiatric:        Mood and Affect: Mood normal.        Behavior: Behavior normal.      Imaging: No results found.

## 2022-03-10 NOTE — Procedures (Signed)
EMG & NCV Findings: Evaluation of the left tibial motor nerve showed reduced amplitude (1.7 mV).  All remaining nerves (as indicated in the following tables) were within normal limits.    All examined muscles (as indicated in the following table) showed no evidence of electrical instability.    Impression: Essentially NORMAL electrodiagnostic study of the left lower limb.  There is no significant electrodiagnostic evidence of nerve entrapment, lumbosacral plexopathy or lumbar radiculopathy.    As you know, purely sensory or demyelinating radiculopathies and chemical radiculitis may not be detected with this particular electrodiagnostic study.  Recommendations: 1.  Follow-up with referring physician. 2.  Continue current management of symptoms. Suggest Lumbar MRI.  ___________________________ Naaman Plummer FAAPMR Board Certified, American Board of Physical Medicine and Rehabilitation    Nerve Conduction Studies Motor Summary Table   Stim Site NR Onset (ms) Norm Onset (ms) O-P Amp (mV) Norm O-P Amp Site1 Site2 Delta-0 (ms) Dist (cm) Vel (m/s) Norm Vel (m/s)  Left Fibular Motor (Ext Dig Brev)  27C  Ankle    4.1 <6.1 3.9 >2.5 B Fib Ankle 7.2 31.0 43 >38  B Fib    11.3  3.5  Poplt B Fib 2.1 10.0 48 >40  Poplt    13.4  3.6         Left Tibial Motor (Abd Hall Brev)  27.2C  Ankle    6.0 <6.1 *1.7 >3.0 Knee Ankle 9.4 37.0 39 >35  Knee    15.4  1.8          EMG   Side Muscle Nerve Root Ins Act Fibs Psw Amp Dur Poly Recrt Int Dennie Bible Comment  Left AntTibialis Dp Br Peron L4-5 Nml Nml Nml Nml Nml 0 Nml Nml   Left Fibularis Longus  Sup Br Peron L5-S1 Nml Nml Nml Nml Nml 0 Nml Nml   Left MedGastroc Tibial S1-2 Nml Nml Nml Nml Nml 0 Nml Nml   Left VastusMed Femoral L2-4 Nml Nml Nml Nml Nml 0 Nml Nml   Left BicepsFemS Sciatic L5-S1 Nml Nml Nml Nml Nml 0 Nml Nml     Nerve Conduction Studies Motor Left/Right Comparison   Stim Site L Lat (ms) R Lat (ms) L-R Lat (ms) L Amp (mV) R Amp (mV) L-R  Amp (%) Site1 Site2 L Vel (m/s) R Vel (m/s) L-R Vel (m/s)  Fibular Motor (Ext Dig Brev)  27C  Ankle 4.1   3.9   B Fib Ankle 43    B Fib 11.3   3.5   Poplt B Fib 48    Poplt 13.4   3.6         Tibial Motor (Abd Hall Brev)  27.2C  Ankle 6.0   *1.7   Knee Ankle 39    Knee 15.4   1.8            Waveforms:

## 2022-03-14 ENCOUNTER — Encounter: Payer: Self-pay | Admitting: Specialist

## 2022-03-14 ENCOUNTER — Ambulatory Visit (INDEPENDENT_AMBULATORY_CARE_PROVIDER_SITE_OTHER): Payer: BC Managed Care – PPO | Admitting: Specialist

## 2022-03-14 VITALS — BP 129/75 | HR 65 | Ht 63.0 in | Wt 167.0 lb

## 2022-03-14 DIAGNOSIS — R200 Anesthesia of skin: Secondary | ICD-10-CM | POA: Diagnosis not present

## 2022-03-14 DIAGNOSIS — R202 Paresthesia of skin: Secondary | ICD-10-CM

## 2022-03-14 DIAGNOSIS — Z981 Arthrodesis status: Secondary | ICD-10-CM

## 2022-03-14 DIAGNOSIS — M4316 Spondylolisthesis, lumbar region: Secondary | ICD-10-CM

## 2022-03-14 MED ORDER — ALPRAZOLAM 0.5 MG PO TABS: ORAL_TABLET | ORAL | 0 refills | Status: AC

## 2022-03-14 NOTE — Patient Instructions (Addendum)
Avoid bending, stooping and avoid lifting weights greater than 10 lbs. Avoid prolong standing and walking. Avoid frequent bending and stooping  No lifting greater than 10 lbs. May use ice or moist heat for pain. Weight loss is of benefit. MRI of the lumbar spine to assess for adjacent level spinal stenosis or recurrent spinal narrowing.

## 2022-03-14 NOTE — Progress Notes (Addendum)
Office Visit Note   Patient: Shannon Williamson           Date of Birth: 1960/08/19           MRN: [id number] Visit Date: 03/14/2022              Requested by: Fleet Contras, MD [address] [place],  [place] [address] PCP: Fleet Contras, MD   Assessment & Plan: Visit Diagnoses:  1. S/P lumbar fusion   2. Paresthesia of skin   3. Bilateral leg numbness   4. Spondylolisthesis of lumbar region   61 year old female with severe spinal stenosis and spondylolisthesis L4-5 and mild at L3-4 underwent decompression with fusion L4-5 and decompression L3-4. She is having ongoing neurogenic claudication Symptoms and underwent EMG/NCV and these are normal. Fusion appears solid. Will proceed with MRI with and without enhancement in order to assess for adjacent recurrent lumbar spinal stenosis or if the discomfort relates to residual of previous severe spinal stenosis with nerve compression.   Plan: Avoid bending, stooping and avoid lifting weights greater than 10 lbs. Avoid prolong standing and walking. Avoid frequent bending and stooping  No lifting greater than 10 lbs. May use ice or moist heat for pain. Weight loss is of benefit. Handicap license is approved. MRI of the lumbar spine to assess for adjacent level spinal stenosis or recurrent spinal narrowing.   Follow-Up Instructions: Return in about 3 weeks (around 04/04/2022).   Orders:  Orders Placed This Encounter  Procedures  . MR Lumbar Spine W Wo Contrast   Meds ordered this encounter  Medications  . ALPRAZolam (XANAX) 0.5 MG tablet    Sig: Take one tablet po at the time of the MRI, after checking in and signing any paperwork.    Dispense:  1 tablet    Refill:  0      Procedures: No procedures performed   Clinical Data: No additional findings.   Subjective: No chief complaint on file.   61 year old female post lumbar fusion 2.5 years ago for spondylolisthesis and severe spinal stenosis. She  has healed the  fusion and has complaints of ongoing pain in the back and the legs. She underwent EMG/NCV of the legs and this shows normal findings.    Review of Systems  Constitutional: Negative.   HENT: Negative.    Eyes: Negative.   Respiratory: Negative.    Cardiovascular: Negative.   Gastrointestinal: Negative.   Endocrine: Negative.   Genitourinary: Negative.   Musculoskeletal: Negative.   Skin: Negative.   Allergic/Immunologic: Negative.   Neurological: Negative.   Hematological: Negative.   Psychiatric/Behavioral: Negative.      Objective: Vital Signs: BP 129/75 (BP Location: Left Arm, Patient Position: Sitting, Cuff Size: Normal)   Pulse 65   Ht 5\' 3"  (1.6 m)   Wt 167 lb (75.8 kg)   BMI 29.58 kg/m   Physical Exam Constitutional:      Appearance: She is well-developed.  HENT:     Head: Normocephalic and atraumatic.  Eyes:     Pupils: Pupils are equal, round, and reactive to light.  Pulmonary:     Effort: Pulmonary effort is normal.     Breath sounds: Normal breath sounds.  Abdominal:     General: Bowel sounds are normal.     Palpations: Abdomen is soft.  Musculoskeletal:     Cervical back: Normal range of motion and neck supple.     Lumbar back: Negative right straight leg raise test and negative  left straight leg raise test.  Skin:    General: Skin is warm and dry.  Neurological:     Mental Status: She is alert and oriented to person, place, and time.  Psychiatric:        Behavior: Behavior normal.        Thought Content: Thought content normal.        Judgment: Judgment normal.   Back Exam   Range of Motion  Extension:  abnormal  Flexion:  abnormal  Lateral bend right:  abnormal  Lateral bend left:  abnormal  Rotation right:  abnormal  Rotation left:  abnormal   Muscle Strength  Right Quadriceps:  5/5  Left Quadriceps:  5/5  Right Hamstrings:  5/5  Left Hamstrings:  5/5   Tests  Straight leg raise right: negative Straight leg raise left:  negative  Reflexes  Patellar:  0/4 Achilles:  0/4  Other  Toe walk: abnormal Heel walk: abnormal Sensation: normal Erythema: no back redness Scars: present  Comments:  No focal neurologic deficit.    Specialty Comments:  No specialty comments available.  Imaging: No results found.   PMFS History: Patient Active Problem List   Diagnosis Date Noted  . Spondylolisthesis, lumbar region 11/15/2019    Priority: High    Class: Chronic  . Spinal stenosis, lumbar region with neurogenic claudication 11/15/2019    Priority: High    Class: Chronic  . S/P lumbar spinal fusion 11/15/2019  . Abdominal pain 02/23/2018  . Fever 02/23/2018  . Generalized body aches 02/23/2018  . Viral illness 02/23/2018  . Acute bronchitis 02/26/2017   Past Medical History:  Diagnosis Date  . Allergy   . Arthritis   . Hypertension     Family History  Problem Relation Age of Onset  . Breast cancer Neg Hx     Past Surgical History:  Procedure Laterality Date  . CARPAL TUNNEL RELEASE Right 06/21/2013   Procedure: RIGHT CARPAL TUNNEL RELEASE;  Surgeon: Nicki Reaper, MD;  Location: [facility];  Service: Orthopedics;  Laterality: Right;  . NO PAST SURGERIES     Social History   Occupational History  . Not on file  Tobacco Use  . Smoking status: Never  . Smokeless tobacco: Never  Vaping Use  . Vaping Use: Never used  Substance and Sexual Activity  . Alcohol use: No  . Drug use: No  . Sexual activity: Yes

## 2022-03-21 DIAGNOSIS — F439 Reaction to severe stress, unspecified: Secondary | ICD-10-CM | POA: Diagnosis not present

## 2022-03-24 ENCOUNTER — Ambulatory Visit
Admission: RE | Admit: 2022-03-24 | Discharge: 2022-03-24 | Disposition: A | Discharge status: Home / Self Care | Payer: BC Managed Care – PPO | Source: Ambulatory Visit | Attending: Specialist | Admitting: Specialist

## 2022-03-24 DIAGNOSIS — M4316 Spondylolisthesis, lumbar region: Secondary | ICD-10-CM | POA: Diagnosis not present

## 2022-03-24 DIAGNOSIS — R202 Paresthesia of skin: Secondary | ICD-10-CM

## 2022-03-24 DIAGNOSIS — Z981 Arthrodesis status: Secondary | ICD-10-CM

## 2022-03-24 DIAGNOSIS — R200 Anesthesia of skin: Secondary | ICD-10-CM

## 2022-03-24 DIAGNOSIS — M5127 Other intervertebral disc displacement, lumbosacral region: Secondary | ICD-10-CM | POA: Diagnosis not present

## 2022-03-24 MED ORDER — GADOPICLENOL 0.5 MMOL/ML IV SOLN
7.0000 mL | Freq: Once | INTRAVENOUS | Status: AC | PRN
Start: 2022-03-24 — End: 2022-03-24
  Administered 2022-03-24: 7 mL via INTRAVENOUS

## 2022-04-04 ENCOUNTER — Encounter: Payer: Self-pay | Admitting: Specialist

## 2022-04-04 ENCOUNTER — Ambulatory Visit (INDEPENDENT_AMBULATORY_CARE_PROVIDER_SITE_OTHER): Payer: BC Managed Care – PPO | Admitting: Specialist

## 2022-04-04 VITALS — BP 145/77 | HR 62 | Ht 63.0 in | Wt 167.0 lb

## 2022-04-04 DIAGNOSIS — R200 Anesthesia of skin: Secondary | ICD-10-CM

## 2022-04-04 DIAGNOSIS — Z981 Arthrodesis status: Secondary | ICD-10-CM | POA: Diagnosis not present

## 2022-04-04 DIAGNOSIS — R202 Paresthesia of skin: Secondary | ICD-10-CM | POA: Diagnosis not present

## 2022-04-04 DIAGNOSIS — M5416 Radiculopathy, lumbar region: Secondary | ICD-10-CM

## 2022-04-04 DIAGNOSIS — M5126 Other intervertebral disc displacement, lumbar region: Secondary | ICD-10-CM

## 2022-04-04 DIAGNOSIS — M5136 Other intervertebral disc degeneration, lumbar region: Secondary | ICD-10-CM

## 2022-04-04 NOTE — Patient Instructions (Signed)
Avoid frequent bending and stooping  No lifting greater than 10-15 lbs. May use ice or moist heat for pain. Weight loss is of benefit. Continue with pregablin and meloxicam. Schedule an appointment to see Dr. Christell Constant for left lumbar radiculopathy associated with left L3-4 subarticular HNP with lateral recess stenosis above previous L4-5 fusion.  Recommend considering extending her fusion to L3-4 with Left TLIF.  Exercise is important to improve your indurance and does allow people to function better inspite of back pain.

## 2022-04-04 NOTE — Progress Notes (Signed)
Office Visit Note   Patient: Shannon Williamson           Date of Birth: 08-03-1960           MRN: [id number] Visit Date: 04/04/2022              Requested by: Fleet Contras, MD [address] [place],  [place] [address] PCP: Fleet Contras, MD   Assessment & Plan: Visit Diagnoses:  1. S/P lumbar fusion   2. Lumbar radiculopathy, chronic   3. Bilateral leg numbness   4. Paresthesia of skin   5. Herniated lumbar intervertebral disc   6. Degenerative disc disease, lumbar     Plan: Avoid frequent bending and stooping  No lifting greater than 10-15 lbs. May use ice or moist heat for pain. Weight loss is of benefit. Continue with pregablin and meloxicam. Schedule an appointment to see Dr. Christell Constant for left lumbar radiculopathy associated with left L3-4 subarticular HNP with lateral recess stenosis above previous L4-5 fusion.  Recommend considering extending her fusion to L3-4 with Left TLIF.  Exercise is important to improve your indurance and does allow people to function better inspite of back pain.    Follow-Up Instructions: Return in about 5 weeks (around 05/09/2022) for Schedule an appointment to see Dr. Christell Constant in 2 weeks for considering L3-4 fusion.   Orders:  No orders of the defined types were placed in this encounter.  No orders of the defined types were placed in this encounter.     Procedures: No procedures performed   Clinical Data: No additional findings.   Subjective: Chief Complaint  Patient presents with   Lower Back - Follow-up    MRI Review    61 year old female post L4-5 TLIF for stenosis with spondylolisthesis. She is having persistent left leg pain with discomfort into the left thigh and calf laterally. Will left leg weakness. Underwent conservative treatment and we have now repeated her MRI. No bowel or bladder difficulty.     Review of Systems  Constitutional: Negative.   HENT: Negative.    Eyes: Negative.   Respiratory: Negative.     Cardiovascular: Negative.   Gastrointestinal: Negative.   Endocrine: Negative.   Genitourinary: Negative.   Musculoskeletal: Negative.   Skin: Negative.   Allergic/Immunologic: Negative.   Neurological: Negative.   Hematological: Negative.   Psychiatric/Behavioral: Negative.       Objective: Vital Signs: BP (!) 145/77 (BP Location: Left Arm, Patient Position: Sitting)   Pulse 62   Ht 5\' 3"  (1.6 m)   Wt 167 lb (75.8 kg)   BMI 29.58 kg/m   Physical Exam Constitutional:      Appearance: She is well-developed.  HENT:     Head: Normocephalic and atraumatic.  Eyes:     Pupils: Pupils are equal, round, and reactive to light.  Pulmonary:     Effort: Pulmonary effort is normal.     Breath sounds: Normal breath sounds.  Abdominal:     General: Bowel sounds are normal.     Palpations: Abdomen is soft.  Musculoskeletal:     Cervical back: Normal range of motion and neck supple.  Skin:    General: Skin is warm and dry.  Neurological:     Mental Status: She is alert and oriented to person, place, and time.  Psychiatric:        Behavior: Behavior normal.        Thought Content: Thought content normal.        Judgment:  Judgment normal.     Back Exam   Range of Motion  Extension:  abnormal  Flexion:  abnormal  Lateral bend right:  abnormal  Lateral bend left:  abnormal  Rotation right:  abnormal  Rotation left:  abnormal   Muscle Strength  Right Quadriceps:  5/5  Left Quadriceps:  4/5  Right Hamstrings:  5/5  Left Hamstrings:  5/5   Reflexes  Patellar:  0/4 Achilles:  0/4  Comments:  Left foot DF 3/5 left knee extension 4/5 SLR is negative.      Specialty Comments:  No specialty comments available.  Imaging: No results found.   PMFS History: Patient Active Problem List   Diagnosis Date Noted   Spondylolisthesis, lumbar region 11/15/2019    Priority: High    Class: Chronic   Spinal stenosis, lumbar region with neurogenic claudication 11/15/2019     Priority: High    Class: Chronic   S/P lumbar spinal fusion 11/15/2019   Abdominal pain 02/23/2018   Fever 02/23/2018   Generalized body aches 02/23/2018   Viral illness 02/23/2018   Acute bronchitis 02/26/2017   Past Medical History:  Diagnosis Date   Allergy    Arthritis    Hypertension     Family History  Problem Relation Age of Onset   Breast cancer Neg Hx     Past Surgical History:  Procedure Laterality Date   CARPAL TUNNEL RELEASE Right 06/21/2013   Procedure: RIGHT CARPAL TUNNEL RELEASE;  Surgeon: Nicki Reaper, MD;  Location: [facility];  Service: Orthopedics;  Laterality: Right;   NO PAST SURGERIES     Social History   Occupational History   Not on file  Tobacco Use   Smoking status: Never   Smokeless tobacco: Never  Vaping Use   Vaping Use: Never used  Substance and Sexual Activity   Alcohol use: No   Drug use: No   Sexual activity: Yes

## 2022-05-09 ENCOUNTER — Ambulatory Visit (INDEPENDENT_AMBULATORY_CARE_PROVIDER_SITE_OTHER): Payer: BC Managed Care – PPO | Admitting: Orthopedic Surgery

## 2022-05-09 DIAGNOSIS — M5416 Radiculopathy, lumbar region: Secondary | ICD-10-CM

## 2022-05-09 NOTE — Progress Notes (Signed)
Orthopedic Spine Surgery Office Note  Assessment: Patient is a 61 y.o. female with low back pain and bilateral leg pain in multiple distributions. Unclear if this is all coming from the L3/4 level that has lateral recess and foraminal stenosis. Has possible pseudarthrosis at L4/5.    Plan: -Explained that initially conservative treatment is tried as a significant number of patients may experience relief with these treatment modalities. Discussed that the conservative treatments include:  -activity modification  -physical therapy  -over the counter pain medications  -medrol dosepak  -lumbar steroid injections -She has not tried a steroid injection so I referred her to Dr. Alvester Morin for a bilateral L3/4 transforaminal injection -She was interested in a surgical option, but her pain is in multiple distributions in her legs so I want to get more information with an injection and have a chance to see/examine her again before deciding on a surgery -Patient should return to office in 6 weeks, repeat x-rays of lumbar spine at next visit: none   Patient expressed understanding of the plan and all questions were answered to the patient's satisfaction.   ___________________________________________________________________________   History:  Patient is a 61 y.o. female who presents today for lumbar spine. Patient has had a L4/5 TLIF and PSIF. States she did well for about a year or two after that surgery. Her leg pain after that surgery got better. Then, she developed bilateral leg pain and numbness within the last year. Feels this pain and numbness in all distributions in bilateral legs. She feels it mostly when active but can feel it at rest as well. She feels sitting down does make it better. Pain has been getting progressively worse with time. There was no trauma or injury that brought on the pain.    Weakness: denies Paresthesias and numbness: yes, numbness in multiple distributions in her legs. No  paresthesias Symptoms of imbalance: denies Bowel or bladder incontinence: denies Saddle anesthesia: denies  Treatments tried: activity modification, over the counter pain medications  Review of systems: Denies fevers and chills, night sweats, unexplained weight loss, history of cancer, pain that wakes them at night  Past medical history: Arthritis HTN  Allergies: NKDA  Past surgical history:  Carpal tunnel release L4/5 TLIF and PSIF   Social history: Denies use of nicotine product (smoking, vaping, patches, smokeless) Alcohol use: denies Denies recreational drug use    Physical Exam:  General: no acute distress, appears stated age Neurologic: alert, answering questions appropriately, following commands Respiratory: unlabored breathing on room air, symmetric chest rise Psychiatric: appropriate affect, normal cadence to speech   MSK (spine):  -Strength exam      Left  Right EHL    4/5  4/5 TA    5/5  5/5 GSC    5/5  5/5 Knee extension  5/5  5/5 Hip flexion   5/5  5/5  -Sensory exam    Sensation intact to light touch in L3-S1 nerve distributions of bilateral lower extremities  -Achilles DTR: 2/4 on the left, 2/4 on the right -Patellar tendon DTR: 1/4 on the left, 1/4 on the right  -Straight leg raise: negative -Contralateral straight leg raise: negative -Clonus: no beats bilaterally  -Left hip exam: no pain through range of motion, negative stinchfield -Right hip exam: no pain through range of motion, negative stinchfield  Imaging: XR of the lumbar spine from 02/04/2022 was independently reviewed and interpreted, showing lucency around the L5 screws. No lucency around the L4 screws. No lucency around the interbody device.  3 degrees of motion between flexion and extension at the fused segment. No significant disc height loss. No fracture or dislocation. No evidence of instability on flexion/extension views.   MRI of the lumbar spine from 03/24/2022 was  independently reviewed and interpreted, showing loss of the normal T2 signal within the L2/3 and L3/4 discs. Lateral recess stenosis at L3/4. Foraminal stenosis at L3/4 and L5/S1.    Patient name: Shannon Williamson Patient MRN: [id number] Date of visit: 05/09/22

## 2022-05-14 DIAGNOSIS — F439 Reaction to severe stress, unspecified: Secondary | ICD-10-CM | POA: Diagnosis not present

## 2022-05-14 DIAGNOSIS — R7303 Prediabetes: Secondary | ICD-10-CM | POA: Diagnosis not present

## 2022-05-14 DIAGNOSIS — E7849 Other hyperlipidemia: Secondary | ICD-10-CM | POA: Diagnosis not present

## 2022-05-14 DIAGNOSIS — Z23 Encounter for immunization: Secondary | ICD-10-CM | POA: Diagnosis not present

## 2022-05-14 DIAGNOSIS — I10 Essential (primary) hypertension: Secondary | ICD-10-CM | POA: Diagnosis not present

## 2022-05-16 ENCOUNTER — Telehealth: Payer: Self-pay

## 2022-05-16 NOTE — Telephone Encounter (Signed)
From [facility] Just wanted to send you a message explaining this referral/authorization. Her insurance only authorized one level (for now) and only gave an 11 day window to schedule. It's probably best to go ahead and get her on the books. She can at least get that one level done (L3 TF). I will keep you updated on whether or not the 2nd level determination gets turned around. Let me know if that was too confusing to follow.....  Message left to return call to clinic to schedule appointment

## 2022-05-17 NOTE — Telephone Encounter (Signed)
Called and scheduled patient for injection on 05/27/22

## 2022-05-27 ENCOUNTER — Ambulatory Visit: Payer: Self-pay

## 2022-05-27 ENCOUNTER — Ambulatory Visit (INDEPENDENT_AMBULATORY_CARE_PROVIDER_SITE_OTHER): Payer: BC Managed Care – PPO | Admitting: Physical Medicine and Rehabilitation

## 2022-05-27 VITALS — BP 146/82 | HR 54

## 2022-05-27 DIAGNOSIS — M961 Postlaminectomy syndrome, not elsewhere classified: Secondary | ICD-10-CM

## 2022-05-27 DIAGNOSIS — M48062 Spinal stenosis, lumbar region with neurogenic claudication: Secondary | ICD-10-CM

## 2022-05-27 DIAGNOSIS — M5416 Radiculopathy, lumbar region: Secondary | ICD-10-CM | POA: Diagnosis not present

## 2022-05-27 MED ORDER — DEXAMETHASONE SODIUM PHOSPHATE 10 MG/ML IJ SOLN
10.0000 mg | Freq: Once | INTRAMUSCULAR | Status: AC
  Administered 2022-05-27: 10 mg

## 2022-05-27 NOTE — Patient Instructions (Signed)

## 2022-05-27 NOTE — Progress Notes (Unsigned)
Numeric Pain Rating Scale and Functional Assessment Average Pain 5   In the last MONTH (on 0-10 scale) has pain interfered with the following?  1. General activity like being  able to carry out your everyday physical activities such as walking, climbing stairs, carrying groceries, or moving a chair?  Rating(10)   +Driver, -BT, -Dye Allergies.  Pain in lower back that radiates down both legs. Can have numbness

## 2022-05-28 NOTE — Progress Notes (Signed)
Shannon Williamson - 61 y.o. female MRN [id number]  Date of birth: 1960-10-04  Office Visit Note: Visit Date: 05/27/2022 PCP: Nolene Ebbs, MD Referred by: Callie Fielding, MD  Subjective: Chief Complaint  Patient presents with   Lower Back - Pain   HPI:  Shannon Williamson is a 61 y.o. female who comes in today at the request of Dr. Ileene Rubens for planned Bilateral L3-4 Lumbar Transforaminal epidural steroid injection with fluoroscopic guidance.  The patient has failed conservative care including home exercise, medications, time and activity modification.  This injection will be diagnostic and hopefully therapeutic.  Please see requesting physician notes for further details and justification. MRI reviewed with images and spine model.  MRI reviewed in the note below.    ROS Otherwise per HPI.  Assessment & Plan: Visit Diagnoses:    ICD-10-CM   1. Lumbar radiculopathy  M54.16 XR C-ARM NO REPORT    Epidural Steroid injection    dexamethasone (DECADRON) injection 10 mg    2. Spinal stenosis of lumbar region with neurogenic claudication  M48.062 XR C-ARM NO REPORT    Epidural Steroid injection    dexamethasone (DECADRON) injection 10 mg    3. Post laminectomy syndrome  M96.1 XR C-ARM NO REPORT    Epidural Steroid injection    dexamethasone (DECADRON) injection 10 mg      Plan: No additional findings.   Meds & Orders:  Meds ordered this encounter  Medications   dexamethasone (DECADRON) injection 10 mg    Orders Placed This Encounter  Procedures   XR C-ARM NO REPORT   Epidural Steroid injection    Follow-up: Return for visit to requesting provider as needed.   Procedures: No procedures performed  Lumbosacral Transforaminal Epidural Steroid Injection - Sub-Pedicular Approach with Fluoroscopic Guidance  Patient: Shannon Williamson      Date of Birth: Jul 28, 1960 MRN: [id number] PCP: Nolene Ebbs, MD      Visit Date: 05/27/2022   Universal Protocol:     Date/Time: 05/27/2022  Consent Given By: the patient  Position: PRONE  Additional Comments: Vital signs were monitored before and after the procedure. Patient was prepped and draped in the usual sterile fashion. The correct patient, procedure, and site was verified.   Injection Procedure Details:   Procedure diagnoses: Lumbar radiculopathy [M54.16]    Meds Administered:  Meds ordered this encounter  Medications   dexamethasone (DECADRON) injection 10 mg    Laterality: Bilateral  Location/Site: L3  Needle:5.0 in., 22 ga.  Short bevel or Quincke spinal needle  Needle Placement: Transforaminal  Findings:    -Comments: Excellent flow of contrast along the nerve, nerve root and into the epidural space.  Procedure Details: After squaring off the end-plates to get a true AP view, the C-arm was positioned so that an oblique view of the foramen as noted above was visualized. The target area is just inferior to the "nose of the scotty dog" or sub pedicular. The soft tissues overlying this structure were infiltrated with 2-3 ml. of 1% Lidocaine without Epinephrine.  The spinal needle was inserted toward the target using a "trajectory" view along the fluoroscope beam.  Under AP and lateral visualization, the needle was advanced so it did not puncture dura and was located close the 6 O'Clock position of the pedical in AP tracterory. Biplanar projections were used to confirm position. Aspiration was confirmed to be negative for CSF and/or blood. A 1-2 ml. volume of Isovue-250 was injected and flow of contrast was noted at  each level. Radiographs were obtained for documentation purposes.   After attaining the desired flow of contrast documented above, a 0.5 to 1.0 ml test dose of 0.25% Marcaine was injected into each respective transforaminal space.  The patient was observed for 90 seconds post injection.  After no sensory deficits were reported, and normal lower extremity motor function  was noted,   the above injectate was administered so that equal amounts of the injectate were placed at each foramen (level) into the transforaminal epidural space.   Additional Comments:  The patient tolerated the procedure well Dressing: 2 x 2 sterile gauze and Band-Aid    Post-procedure details: Patient was observed during the procedure. Post-procedure instructions were reviewed.  Patient left the clinic in stable condition.    Clinical History: No specialty comments available.     Objective:  VS:  HT:    WT:   BMI:     BP:(!) 146/82  HR:(!) 54bpm  TEMP: ( )  RESP:  Physical Exam Vitals and nursing note reviewed.  Constitutional:      General: She is not in acute distress.    Appearance: Normal appearance. She is not ill-appearing.  HENT:     Head: Normocephalic and atraumatic.     Right Ear: External ear normal.     Left Ear: External ear normal.  Eyes:     Extraocular Movements: Extraocular movements intact.  Cardiovascular:     Rate and Rhythm: Normal rate.     Pulses: Normal pulses.  Pulmonary:     Effort: Pulmonary effort is normal. No respiratory distress.  Abdominal:     General: There is no distension.     Palpations: Abdomen is soft.  Musculoskeletal:        General: Tenderness present.     Cervical back: Neck supple.     Right lower leg: No edema.     Left lower leg: No edema.     Comments: Patient has good distal strength with no pain over the greater trochanters.  No clonus or focal weakness.  Skin:    Findings: No erythema, lesion or rash.  Neurological:     General: No focal deficit present.     Mental Status: She is alert and oriented to person, place, and time.     Sensory: No sensory deficit.     Motor: No weakness or abnormal muscle tone.     Coordination: Coordination normal.  Psychiatric:        Mood and Affect: Mood normal.        Behavior: Behavior normal.      Imaging: No results found.

## 2022-05-28 NOTE — Procedures (Signed)
Lumbosacral Transforaminal Epidural Steroid Injection - Sub-Pedicular Approach with Fluoroscopic Guidance  Patient: Shannon Williamson      Date of Birth: 1961/04/30 MRN: [id number] PCP: Nolene Ebbs, MD      Visit Date: 05/27/2022   Universal Protocol:    Date/Time: 05/27/2022  Consent Given By: the patient  Position: PRONE  Additional Comments: Vital signs were monitored before and after the procedure. Patient was prepped and draped in the usual sterile fashion. The correct patient, procedure, and site was verified.   Injection Procedure Details:   Procedure diagnoses: Lumbar radiculopathy [M54.16]    Meds Administered:  Meds ordered this encounter  Medications   dexamethasone (DECADRON) injection 10 mg    Laterality: Bilateral  Location/Site: L3  Needle:5.0 in., 22 ga.  Short bevel or Quincke spinal needle  Needle Placement: Transforaminal  Findings:    -Comments: Excellent flow of contrast along the nerve, nerve root and into the epidural space.  Procedure Details: After squaring off the end-plates to get a true AP view, the C-arm was positioned so that an oblique view of the foramen as noted above was visualized. The target area is just inferior to the "nose of the scotty dog" or sub pedicular. The soft tissues overlying this structure were infiltrated with 2-3 ml. of 1% Lidocaine without Epinephrine.  The spinal needle was inserted toward the target using a "trajectory" view along the fluoroscope beam.  Under AP and lateral visualization, the needle was advanced so it did not puncture dura and was located close the 6 O'Clock position of the pedical in AP tracterory. Biplanar projections were used to confirm position. Aspiration was confirmed to be negative for CSF and/or blood. A 1-2 ml. volume of Isovue-250 was injected and flow of contrast was noted at each level. Radiographs were obtained for documentation purposes.   After attaining the desired flow of  contrast documented above, a 0.5 to 1.0 ml test dose of 0.25% Marcaine was injected into each respective transforaminal space.  The patient was observed for 90 seconds post injection.  After no sensory deficits were reported, and normal lower extremity motor function was noted,   the above injectate was administered so that equal amounts of the injectate were placed at each foramen (level) into the transforaminal epidural space.   Additional Comments:  The patient tolerated the procedure well Dressing: 2 x 2 sterile gauze and Band-Aid    Post-procedure details: Patient was observed during the procedure. Post-procedure instructions were reviewed.  Patient left the clinic in stable condition.

## 2022-06-20 ENCOUNTER — Ambulatory Visit (INDEPENDENT_AMBULATORY_CARE_PROVIDER_SITE_OTHER): Payer: BC Managed Care – PPO | Admitting: Orthopedic Surgery

## 2022-06-20 DIAGNOSIS — M48062 Spinal stenosis, lumbar region with neurogenic claudication: Secondary | ICD-10-CM

## 2022-06-20 NOTE — Progress Notes (Signed)
Orthopedic Spine Surgery Office Note  Assessment: Patient is a 61 y.o. female with low back pain that radiates into bilateral legs consistent with neurogenic claudication.  Has had symptomatic improvement with an injection.  MRI showed central lateral recess stenosis above her prior fusion at L3-4.   Plan: -Explained that initially conservative treatment is tried to see what relief can be gained with these non-surgical treatment modalities. Discussed that the conservative treatments include:  -activity modification  -physical therapy  -over the counter pain medications  -medrol dosepak -Patient has tried activity modification, over-the-counter medications, injection -Explained that if her pain recurs, all of the above options are still available including a repeat injection -She has had significant improvement with injections so she can return to the office on an as-needed basis  Patient expressed understanding of the plan and all questions were answered to the patient's satisfaction.   ___________________________________________________________________________  History:  Patient is a 61 y.o. female who has been previously seen in the office for symptoms consistent with lumbar stenosis. Since the last visit, symptom intensity has become less severe.  To recap, patient had pain that started in her back and radiate down the bilateral legs.  It was worse with activity and improved when she sat down.  It was also better if she leaned over a shopping cart.  At her last visit, I recommended an injection.  She got the injection and has had significant relief.  She says she has very minor pains in her legs now.  She is not having any back pain.  She is able to get back to activity without issue.  Occasionally, has numbness and tingling in her calves but this always resolves.  Previous treatments: Activity modification, over-the-counter medications, injection   Physical Exam:  General: no acute  distress, appears stated age Neurologic: alert, answering questions appropriately, following commands Respiratory: unlabored breathing on room air, symmetric chest rise Psychiatric: appropriate affect, normal cadence to speech Vascular: palpable dorsalis pedis pulses bilaterally, feet warm and well perfused   MSK (spine):  -Strength exam      Left  Right EHL    4/5  4/5 TA    5/5  5/5 GSC    5/5  5/5 Knee extension  5/5  5/5 Hip flexion   5/5  5/5  -Sensory exam    Sensation intact to light touch in L3-S1 nerve distributions of bilateral lower extremities  Left hip exam: no pain through range of motion, negative Stinchfield Right hip exam: No pain to range of motion, negative Stinchfield  -Straight leg raise: negative bilaterally -Clonus: no beats bilaterally  Imaging: XR of the lumbar spine from 02/04/2022 was previously independently reviewed and interpreted, showing lucency around the L5 screws. No lucency around the L4 screws. No lucency around the interbody device. 3 degrees of motion between flexion and extension at the fused segment. No significant disc height loss. No fracture or dislocation. No evidence of instability on flexion/extension views.    MRI of the lumbar spine from 03/24/2022 was previously independently reviewed and interpreted, showing lateral recess and central stenosis at L3/4. Foraminal stenosis at L3/4 and L5/S1.    Patient name: Shannon Williamson Patient MRN: [id number] Date of visit: 06/20/22

## 2022-08-15 DIAGNOSIS — R7303 Prediabetes: Secondary | ICD-10-CM | POA: Diagnosis not present

## 2022-08-15 DIAGNOSIS — Z0000 Encounter for general adult medical examination without abnormal findings: Secondary | ICD-10-CM | POA: Diagnosis not present

## 2022-08-15 DIAGNOSIS — E7849 Other hyperlipidemia: Secondary | ICD-10-CM | POA: Diagnosis not present

## 2022-08-15 DIAGNOSIS — E559 Vitamin D deficiency, unspecified: Secondary | ICD-10-CM | POA: Diagnosis not present

## 2022-08-15 DIAGNOSIS — I10 Essential (primary) hypertension: Secondary | ICD-10-CM | POA: Diagnosis not present

## 2022-08-23 ENCOUNTER — Other Ambulatory Visit: Payer: Self-pay | Admitting: Internal Medicine

## 2022-08-23 DIAGNOSIS — Z1231 Encounter for screening mammogram for malignant neoplasm of breast: Secondary | ICD-10-CM

## 2022-08-27 ENCOUNTER — Ambulatory Visit
Admission: RE | Admit: 2022-08-27 | Discharge: 2022-08-27 | Disposition: A | Discharge status: Home / Self Care | Payer: BC Managed Care – PPO | Source: Ambulatory Visit | Attending: Internal Medicine | Admitting: Internal Medicine

## 2022-08-27 DIAGNOSIS — Z1231 Encounter for screening mammogram for malignant neoplasm of breast: Secondary | ICD-10-CM

## 2022-12-19 DIAGNOSIS — J302 Other seasonal allergic rhinitis: Secondary | ICD-10-CM | POA: Diagnosis not present

## 2022-12-19 DIAGNOSIS — R7303 Prediabetes: Secondary | ICD-10-CM | POA: Diagnosis not present

## 2022-12-19 DIAGNOSIS — E669 Obesity, unspecified: Secondary | ICD-10-CM | POA: Diagnosis not present

## 2022-12-19 DIAGNOSIS — I10 Essential (primary) hypertension: Secondary | ICD-10-CM | POA: Diagnosis not present

## 2023-01-18 DIAGNOSIS — L255 Unspecified contact dermatitis due to plants, except food: Secondary | ICD-10-CM | POA: Diagnosis not present

## 2023-03-26 ENCOUNTER — Other Ambulatory Visit (INDEPENDENT_AMBULATORY_CARE_PROVIDER_SITE_OTHER): Payer: BC Managed Care – PPO

## 2023-03-26 ENCOUNTER — Ambulatory Visit (INDEPENDENT_AMBULATORY_CARE_PROVIDER_SITE_OTHER): Payer: BC Managed Care – PPO | Admitting: Orthopedic Surgery

## 2023-03-26 DIAGNOSIS — M48062 Spinal stenosis, lumbar region with neurogenic claudication: Secondary | ICD-10-CM

## 2023-03-26 DIAGNOSIS — M5416 Radiculopathy, lumbar region: Secondary | ICD-10-CM

## 2023-03-26 NOTE — Progress Notes (Signed)
Orthopedic Spine Surgery Office Note   Assessment: Patient is a 62 y.o. female with low back pain that radiates into bilateral legs consistent with neurogenic claudication.  Has had symptomatic improvement with an injection.  MRI showed central and lateral recess stenosis above her prior fusion at L3-4.     Plan: -Patient has tried activity modification, over-the-counter medications, lumbar injections, lyrica -Told her additional non-operative treatments we could try at our next visit would be PT and repeat injection -Surgery would be an option as well but she is not interested in that option at this time -Told her she can take 500mg  tylenol TID with the lyrica. Also, said she could use aleve for bad pain days but she should not use it regularly as it can affect the kidneys and stomach -Return to office in 6 weeks, x-rays at next visit: none   Patient expressed understanding of the plan and all questions were answered to the patient's satisfaction.    ___________________________________________________________________________   History:   Patient is a 62 y.o. female who has been previously seen today for follow up on her lumbar spine.  At her last visit, patient had a injection with Dr. Alvester Morin.  She said she got 60 to 70% relief with the injection. She said it lasted for about 4-5 months but then pain returned.  She is feeling pain in her low back that radiates into the bilateral legs.  She feels it along the posterior aspect of the thighs and legs.  She says the pain is worse with activity and goes away if she is sitting.  She does notice better if she flexes the lumbar spine.  Gets periodic numbness and paresthesias in her calves.  No other numbness or paresthesias.  No bowel or bladder incontinence.  No saddle anesthesia.  Previous treatments: Activity modification, over-the-counter medications, lumbar injections, lyrica     Physical Exam:   General: no acute distress, appears stated  age Neurologic: alert, answering questions appropriately, following commands Respiratory: unlabored breathing on room air, symmetric chest rise Psychiatric: appropriate affect, normal cadence to speech Vascular: palpable dorsalis pedis pulses bilaterally, feet warm and well perfused     MSK (spine):   -Strength exam                                                   Left                  Right EHL                              4/5                  4/5 TA                                 5/5                  5/5 GSC                             5/5                  5/5 Knee extension  5/5                  5/5 Hip flexion                    5/5                  5/5   -Sensory exam                           Sensation intact to light touch in L3-S1 nerve distributions of bilateral lower extremities   Left hip exam: no pain through range of motion, negative Stinchfield, negative faber Right hip exam: no pain to range of motion, negative Stinchfield, negative faber   -Straight leg raise: negative bilaterally -Clonus: no beats bilaterally   Imaging: XRs of the lumbar spine from 03/26/2023 were independently reviewed and interpreted, showing posterior instrumentation at L4 and L5 with no lucency around the screws. Screws have not backed out. Interbody device at L4/5 that is in appropriate position. No significant degenerative changes seen. No evidence of instability on flexion/extension views.    MRI of the lumbar spine from 03/24/2022 was previously independently reviewed and interpreted, showing lateral recess and central stenosis at L3/4. Foraminal stenosis at L3/4 and L5/S1.      Patient name: Shannon Williamson Patient MRN: [id number] Date of visit: 03/26/23

## 2023-04-23 IMAGING — MG MM DIGITAL SCREENING BILAT W/ TOMO AND CAD
8 series · 8 of 24 positions shown · non-contrast
Comparison: Previous exam(s).

ACR Breast Density Category a: The breast tissue is almost entirely
fatty.

CLINICAL DATA: Screening.

EXAM:
DIGITAL SCREENING BILATERAL MAMMOGRAM WITH TOMOSYNTHESIS AND CAD
TECHNIQUE: Bilateral screening digital craniocaudal and mediolateral oblique
mammograms were obtained. Bilateral screening digital breast
tomosynthesis was performed. The images were evaluated with
computer-aided detection.

[R MLO synth-2D]
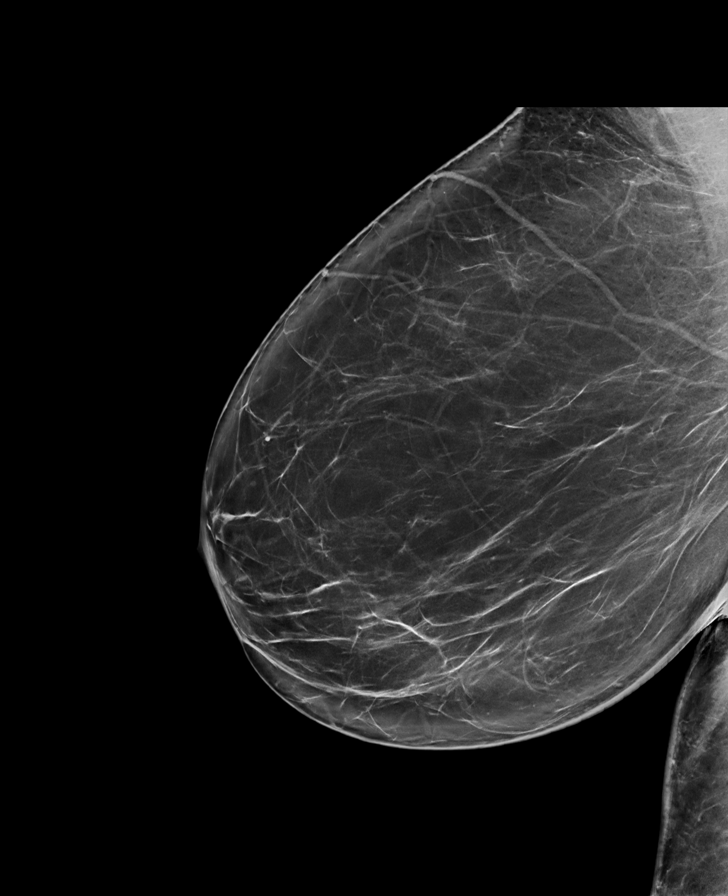

[L MLO synth-2D]
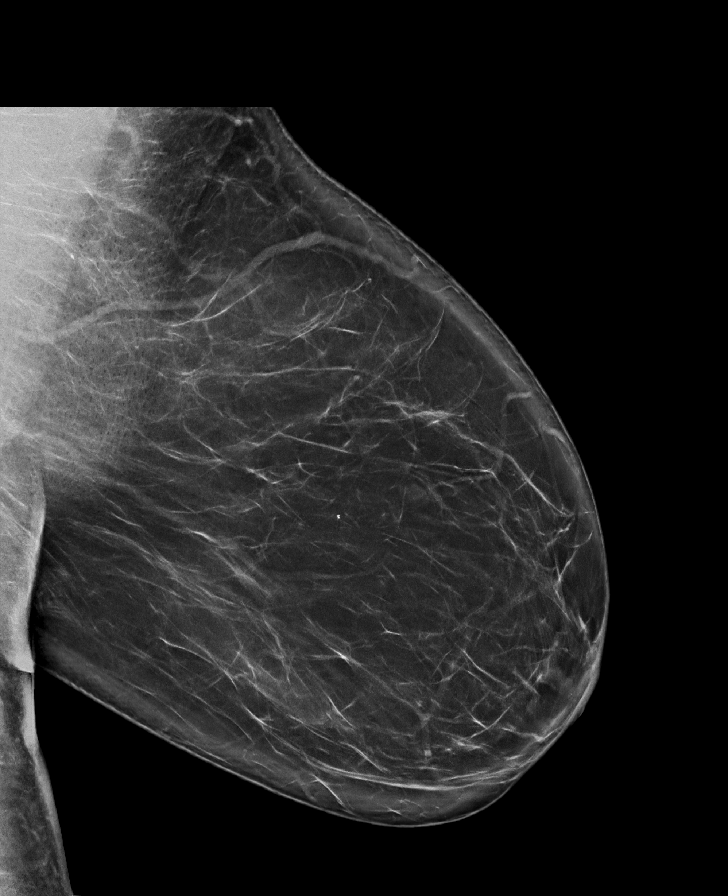

[L CC synth-2D]
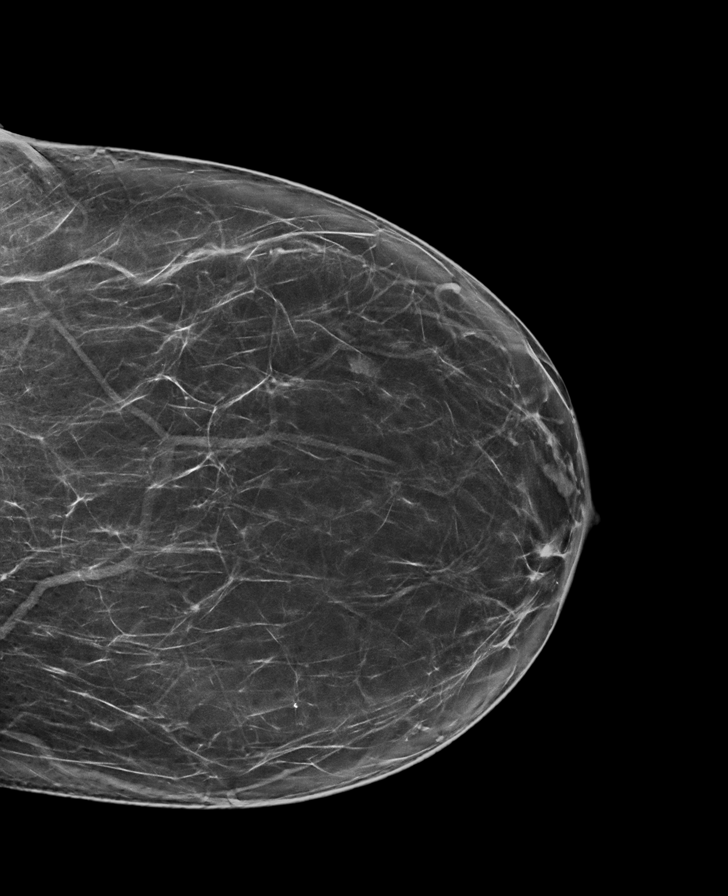

[R CC synth-2D]
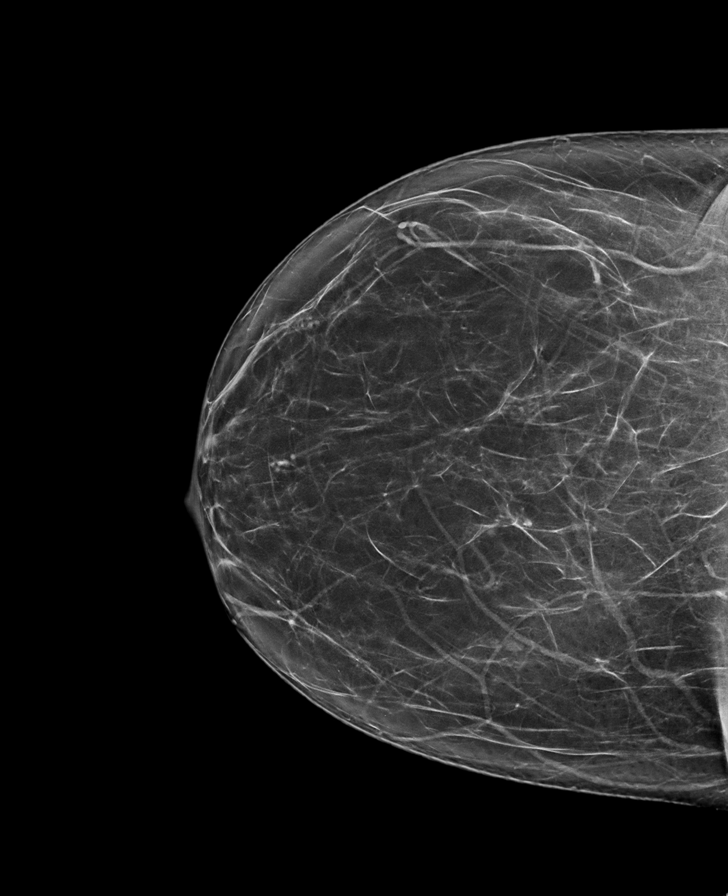

[R MLO tomo · tomo slice 47/93.0]
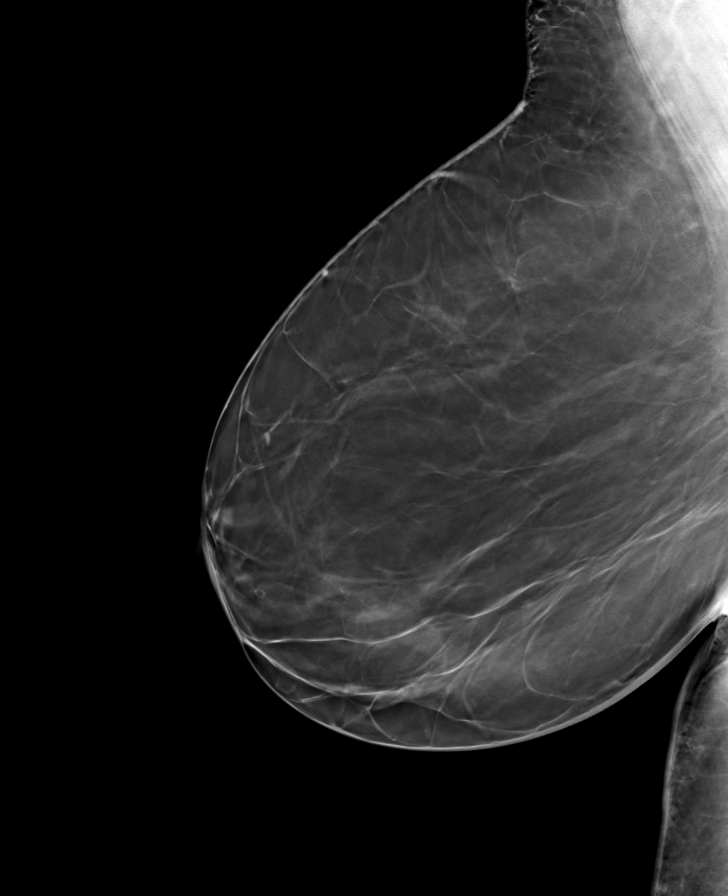

[R CC tomo · tomo slice 40/79.0]
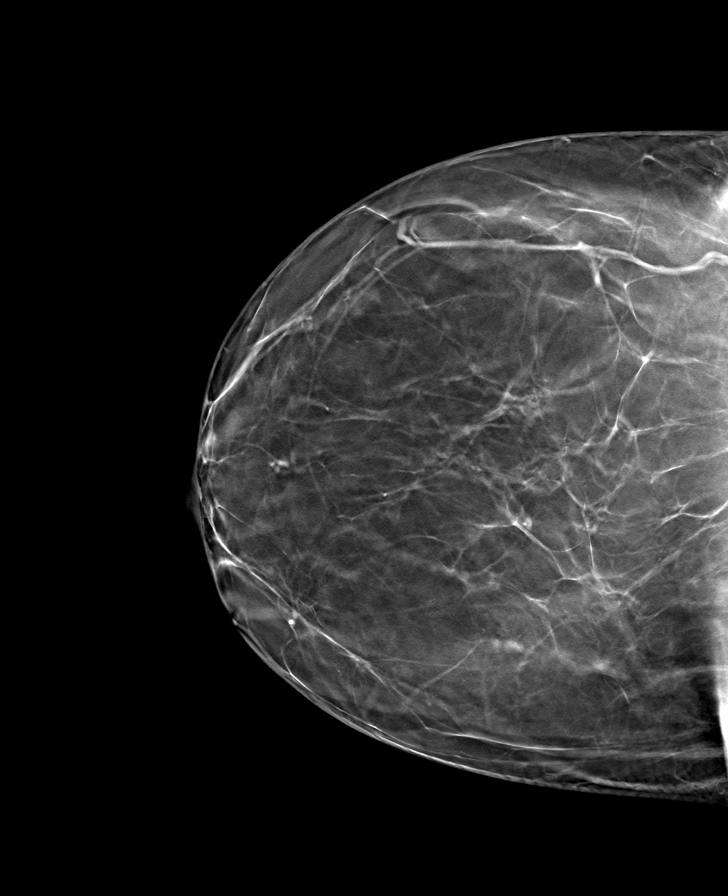

[L CC tomo · tomo slice 41/81.0]
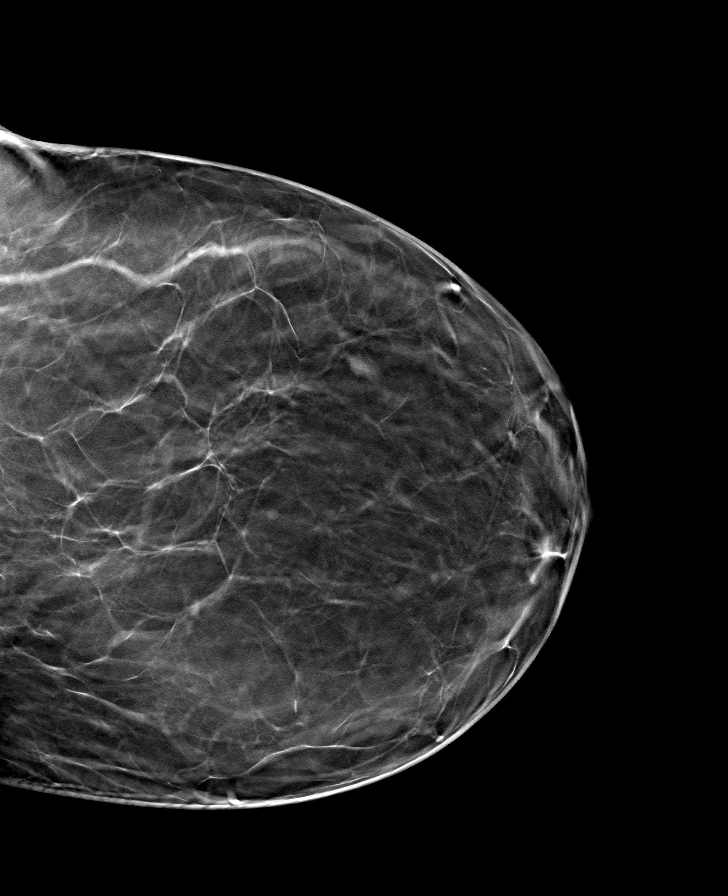

[L MLO tomo · tomo slice 51/100.0]
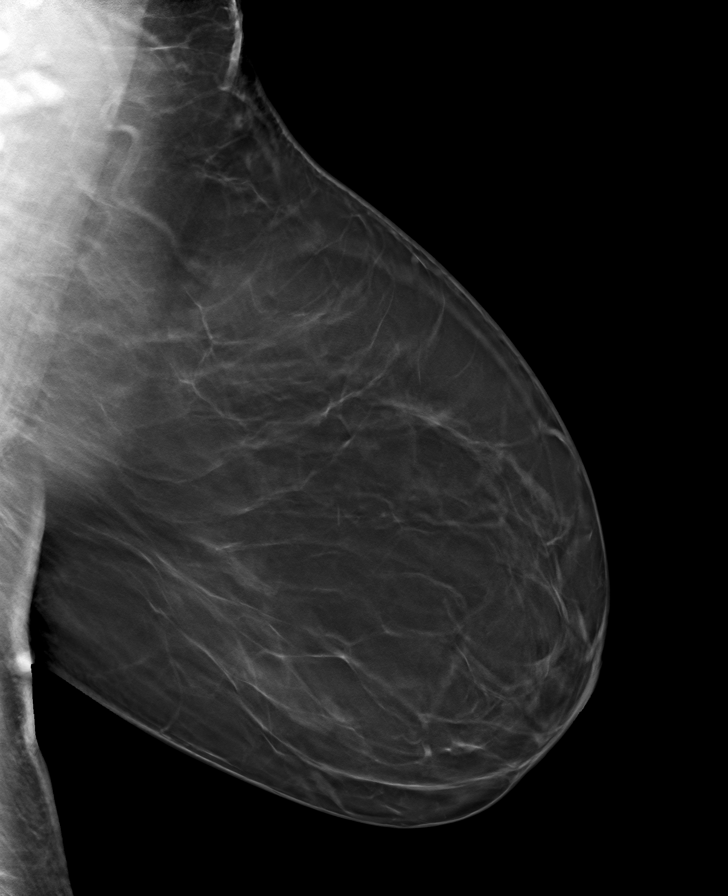

[8 of 24 positions shown; findings below may reference images not displayed]

FINDINGS: There are no findings suspicious for malignancy.
IMPRESSION: No mammographic evidence of malignancy. A result letter of this
screening mammogram will be mailed directly to the patient.

RECOMMENDATION:
Screening mammogram in one year. (Code:0E-3-N98)

BI-RADS CATEGORY  1: Negative.

## 2023-04-24 DIAGNOSIS — I10 Essential (primary) hypertension: Secondary | ICD-10-CM | POA: Diagnosis not present

## 2023-04-24 DIAGNOSIS — R7303 Prediabetes: Secondary | ICD-10-CM | POA: Diagnosis not present

## 2023-04-24 DIAGNOSIS — E7849 Other hyperlipidemia: Secondary | ICD-10-CM | POA: Diagnosis not present

## 2023-04-24 DIAGNOSIS — Z23 Encounter for immunization: Secondary | ICD-10-CM | POA: Diagnosis not present

## 2023-04-24 DIAGNOSIS — Z124 Encounter for screening for malignant neoplasm of cervix: Secondary | ICD-10-CM | POA: Diagnosis not present

## 2023-05-08 ENCOUNTER — Ambulatory Visit: Payer: BC Managed Care – PPO | Admitting: Orthopedic Surgery

## 2023-05-08 DIAGNOSIS — M48062 Spinal stenosis, lumbar region with neurogenic claudication: Secondary | ICD-10-CM

## 2023-05-08 NOTE — Progress Notes (Signed)
Orthopedic Spine Surgery Office Note   Assessment: Patient is a 62 y.o. female with low back pain that radiates into bilateral legs consistent with neurogenic claudication. MRI showed central and lateral recess stenosis above her prior fusion at L3/4.     Plan: -Patient has tried activity modification, over-the-counter medications, lumbar injections, lyrica -After discussing her treatment options, she wanted to continue to try lyrica. She said if it is not helping her in a couple of weeks, she would call to try another injection -Briefly discussed decompression and extension of her fusion to L3 but she was not interested in that treatment at this time -Patient wanted to call to schedule her next appointment   Patient expressed understanding of the plan and all questions were answered to the patient's satisfaction.    ___________________________________________________________________________   History:   Patient is a 62 y.o. female who has been previously seen today for follow up on her lumbar spine.  Patient is having low back pain and bilateral lower extremity pain.  She feels it in the lateral aspect of the thighs and posterior aspect of the legs.  She says that the pain is worse if she is standing for more than a couple minutes or walking for longer distances.  Pain gets better if she sits down.  She also notes pain when she goes from a seated position to a standing position.  She has been trying the Lyrica but has not noticed any significant difference.  She had gotten an injection in the past and notes about 70% relief with that.  She currently rates her pain as a 7 out of 10 at its worst.  She has now had this pain for over a year.   Previous treatments: Activity modification, over-the-counter medications, lumbar injections, lyrica     Physical Exam:   General: no acute distress, appears stated age Neurologic: alert, answering questions appropriately, following commands Respiratory:  unlabored breathing on room air, symmetric chest rise Psychiatric: appropriate affect, normal cadence to speech Vascular: palpable dorsalis pedis pulses bilaterally, feet warm and well perfused     MSK (spine):   -Strength exam                                                   Left                  Right EHL                              4/5                  4/5 TA                                 5/5                  5/5 GSC                             5/5                  5/5 Knee extension            5/5  5/5 Hip flexion                    5/5                  5/5   -Sensory exam                           Sensation intact to light touch in L3-S1 nerve distributions of bilateral lower extremities   Left hip exam: no pain through range of motion, negative Stinchfield, negative faber Right hip exam: no pain to range of motion, negative Stinchfield, negative faber   -Straight leg raise: negative bilaterally -Clonus: no beats bilaterally   Imaging: XRs of the lumbar spine from 03/26/2023 were previously independently reviewed and interpreted, showing posterior instrumentation at L4 and L5 with no lucency around the screws. Screws have not backed out. Interbody device at L4/5 that is in appropriate position. No significant degenerative changes seen. No evidence of instability on flexion/extension views.    MRI of the lumbar spine from 03/24/2022 was previously independently reviewed and interpreted, showing lateral recess and central stenosis at L3/4. Foraminal stenosis at L3/4 and L5/S1.      Patient name: Shannon Williamson Patient MRN: [id number] Date of visit: 05/08/23

## 2023-07-22 ENCOUNTER — Ambulatory Visit (INDEPENDENT_AMBULATORY_CARE_PROVIDER_SITE_OTHER): Payer: BC Managed Care – PPO | Admitting: Obstetrics and Gynecology

## 2023-07-22 ENCOUNTER — Encounter: Payer: Self-pay | Admitting: Obstetrics and Gynecology

## 2023-07-22 VITALS — BP 175/82 | HR 77 | Ht 62.0 in | Wt 173.4 lb

## 2023-07-22 DIAGNOSIS — Z01419 Encounter for gynecological examination (general) (routine) without abnormal findings: Secondary | ICD-10-CM

## 2023-07-22 NOTE — Progress Notes (Signed)
 No C/O. Last mammo 2024. BP recheck. Elevated.

## 2023-07-22 NOTE — Progress Notes (Signed)
   ANNUAL EXAM Patient name: Shannon Williamson MRN [id number]  Date of birth: April 20, 1961 Chief Complaint:   Establish Care  History of Present Illness:   Shannon Williamson is a 62 y.o. G57P0  female being seen today for a routine annual exam.  Current complaints: establish care, no vaginal or pelvic complaints, declines PM bleeding. Primary care manages blood pressure.   No LMP recorded (lmp unknown). Patient is postmenopausal.   The pregnancy intention screening data noted above was reviewed. Potential methods of contraception were discussed. The patient elected to proceed with No data recorded.   Last pap 2022. Results were: NILM w/ HRHPV negative. H/O abnormal pap: yes, 2014 Last mammogram: 2024. Results were: normal. Family h/o breast cancer: no      02/26/2018    8:28 AM 02/23/2018   10:40 AM 05/28/2017   11:17 AM 02/26/2017   11:48 AM 08/25/2015   12:03 PM  Depression screen PHQ 2/9  Decreased Interest 0 0 0 0 0  Down, Depressed, Hopeless 0 0 0 0 0  PHQ - 2 Score 0 0 0 0 0  Altered sleeping   0    Tired, decreased energy   0    Change in appetite   0    Feeling bad or failure about yourself    0    Trouble concentrating   0    Moving slowly or fidgety/restless   0    Suicidal thoughts   0    PHQ-9 Score   0    Difficult doing work/chores   Not difficult at all           No data to display           Review of Systems:   Pertinent items are noted in HPI Denies any headaches, blurred vision, fatigue, shortness of breath, chest pain, abdominal pain, abnormal vaginal discharge/itching/odor/irritation, problems with periods, bowel movements, urination, or intercourse unless otherwise stated above. Pertinent History Reviewed:  Reviewed past medical,surgical, social and family history.  Reviewed problem list, medications and allergies. Physical Assessment:   Vitals:   07/22/23 1317  BP: (!) 168/79  Pulse: 77  Weight: 173 lb 6.4 oz (78.7 kg)  Height: 5' 2 (1.575 m)   Body mass index is 31.72 kg/m.        Physical Examination:   General appearance - well appearing, and in no distress  Mental status - alert, oriented  Psych:  She has a normal mood and affect  Skin - warm and dry  Chest - effort normal  Heart - normal rate   Neck:  midline trachea  Pelvic - deferred   Extremities:  No swelling or varicosities noted  Chaperone present for exam  No results found for this or any previous visit (from the past 24 hours).  Assessment & Plan:  1. Well woman exam (Primary) UTD on pap smear, discussed guidelines indicate no longer need pap smears  UTD on mammogram, will be due in February Discussed elevated BP, currently on meds, has a follow up on Thursday No complaints today, discussed follow up for any vaginal or pelvic concerns, or bleeding.    Labs/procedures today:   Mammogram:  screening due in February , or sooner if problems  No orders of the defined types were placed in this encounter.   Meds: No orders of the defined types were placed in this encounter.   Follow-up: Return as needed  Nidia Daring, FNP

## 2023-07-24 DIAGNOSIS — Z0000 Encounter for general adult medical examination without abnormal findings: Secondary | ICD-10-CM | POA: Diagnosis not present

## 2023-07-24 DIAGNOSIS — E559 Vitamin D deficiency, unspecified: Secondary | ICD-10-CM | POA: Diagnosis not present

## 2023-07-24 DIAGNOSIS — E7849 Other hyperlipidemia: Secondary | ICD-10-CM | POA: Diagnosis not present

## 2023-07-24 DIAGNOSIS — I10 Essential (primary) hypertension: Secondary | ICD-10-CM | POA: Diagnosis not present

## 2023-07-24 DIAGNOSIS — E119 Type 2 diabetes mellitus without complications: Secondary | ICD-10-CM | POA: Diagnosis not present

## 2023-07-24 DIAGNOSIS — J069 Acute upper respiratory infection, unspecified: Secondary | ICD-10-CM | POA: Diagnosis not present

## 2023-07-24 DIAGNOSIS — J101 Influenza due to other identified influenza virus with other respiratory manifestations: Secondary | ICD-10-CM | POA: Diagnosis not present

## 2023-08-19 DIAGNOSIS — U071 COVID-19: Secondary | ICD-10-CM | POA: Diagnosis not present

## 2024-02-29 ENCOUNTER — Emergency Department (HOSPITAL_COMMUNITY)

## 2024-02-29 ENCOUNTER — Other Ambulatory Visit: Payer: Self-pay

## 2024-02-29 ENCOUNTER — Emergency Department (HOSPITAL_COMMUNITY)
Admission: EM | Admit: 2024-02-29 | Discharge: 2024-02-29 | Disposition: A | Discharge status: Home / Self Care | Attending: Emergency Medicine | Admitting: Emergency Medicine

## 2024-02-29 ENCOUNTER — Encounter (HOSPITAL_COMMUNITY): Payer: Self-pay | Admitting: Emergency Medicine

## 2024-02-29 DIAGNOSIS — Z79899 Other long term (current) drug therapy: Secondary | ICD-10-CM | POA: Diagnosis not present

## 2024-02-29 DIAGNOSIS — R079 Chest pain, unspecified: Secondary | ICD-10-CM | POA: Insufficient documentation

## 2024-02-29 DIAGNOSIS — I10 Essential (primary) hypertension: Secondary | ICD-10-CM | POA: Diagnosis not present

## 2024-02-29 DIAGNOSIS — R0789 Other chest pain: Secondary | ICD-10-CM | POA: Diagnosis not present

## 2024-02-29 DIAGNOSIS — R064 Hyperventilation: Secondary | ICD-10-CM | POA: Diagnosis not present

## 2024-02-29 DIAGNOSIS — I517 Cardiomegaly: Secondary | ICD-10-CM | POA: Diagnosis not present

## 2024-02-29 LAB — CBC WITH DIFFERENTIAL/PLATELET
Abs Immature Granulocytes: 0.01 K/uL (ref 0.00–0.07)
Basophils Absolute: 0 K/uL (ref 0.0–0.1)
Basophils Relative: 1 %
Eosinophils Absolute: 0.1 K/uL (ref 0.0–0.5)
Eosinophils Relative: 2 %
HCT: 36.7 % (ref 36.0–46.0)
Hemoglobin: 12.3 g/dL (ref 12.0–15.0)
Immature Granulocytes: 0 %
Lymphocytes Relative: 46 %
Lymphs Abs: 2.6 K/uL (ref 0.7–4.0)
MCH: 30.1 pg (ref 26.0–34.0)
MCHC: 33.5 g/dL (ref 30.0–36.0)
MCV: 90 fL (ref 80.0–100.0)
Monocytes Absolute: 0.5 K/uL (ref 0.1–1.0)
Monocytes Relative: 8 %
Neutro Abs: 2.4 K/uL (ref 1.7–7.7)
Neutrophils Relative %: 43 %
Platelets: 185 K/uL (ref 150–400)
RBC: 4.08 MIL/uL (ref 3.87–5.11)
RDW: 13.4 % (ref 11.5–15.5)
WBC: 5.5 K/uL (ref 4.0–10.5)
nRBC: 0 % (ref 0.0–0.2)

## 2024-02-29 LAB — COMPREHENSIVE METABOLIC PANEL WITH GFR
ALT: 28 U/L (ref 0–44)
AST: 41 U/L (ref 15–41)
Albumin: 3.8 g/dL (ref 3.5–5.0)
Alkaline Phosphatase: 70 U/L (ref 38–126)
Anion gap: 12 (ref 5–15)
BUN: 19 mg/dL (ref 8–23)
CO2: 22 mmol/L (ref 22–32)
Calcium: 9.7 mg/dL (ref 8.9–10.3)
Chloride: 104 mmol/L (ref 98–111)
Creatinine, Ser: 1.05 mg/dL — ABNORMAL HIGH (ref 0.44–1.00)
GFR, Estimated: >60 mL/min (ref >60)
Glucose, Bld: 133 mg/dL — ABNORMAL HIGH (ref 70–99)
Potassium: 4 mmol/L (ref 3.5–5.1)
Sodium: 138 mmol/L (ref 135–145)
Total Bilirubin: 1.1 mg/dL (ref 0.0–1.2)
Total Protein: 7.7 g/dL (ref 6.5–8.1)

## 2024-02-29 LAB — MAGNESIUM: Magnesium: 2 mg/dL (ref 1.7–2.4)

## 2024-02-29 LAB — TROPONIN I (HIGH SENSITIVITY)
Troponin I (High Sensitivity): 6 ng/L (ref <18)
Troponin I (High Sensitivity): 12 ng/L (ref <18)

## 2024-02-29 MED ORDER — SODIUM CHLORIDE 0.9 % IV BOLUS
1000.0000 mL | Freq: Once | INTRAVENOUS | Status: AC
Start: 2024-02-29 — End: 2024-02-29
  Administered 2024-02-29: 1000 mL via INTRAVENOUS

## 2024-02-29 NOTE — Discharge Instructions (Signed)
 You were seen in the emerged part for chest pain Your blood work EKG and chest x-ray all looked okay You are provided you with a referral to cardiology to be seen in the office given that this is not your first episode of chest pain Continue taking all previous prescribed occasions Return to the emerged part for severe chest pain trouble breathing or any other concerns

## 2024-02-29 NOTE — ED Provider Notes (Signed)
 [place] EMERGENCY DEPARTMENT AT [facility] Provider Note   CSN: [id number] Arrival date & time: 02/29/24  1153     Patient presents with: Chest Pain   Shannon Williamson is a 63 y.o. female.  With a history of hypertension who presents to the ED for chest pain.  Patient was attending church this morning with her family when she developed acute onset chest tightness.  She sat down immediately and there was no associated syncope or fall.  Some associated shortness of breath with anxiety.  She has had similar episodes in the past per her husband at bedside.  EMS gave 324 aspirin prior to arrival.  Chest pain has resolved since coming to the ED.  Denies headaches nausea vomiting fevers chills and recent illness.    Chest Pain      Prior to Admission medications   Medication Sig Start Date End Date Taking? Authorizing Provider  ALPRAZolam  (XANAX ) 0.5 MG tablet Take one tablet po at the time of the MRI, after checking in and signing any paperwork. Patient not taking: Reported on 07/22/2023 03/14/22   Nitka, James E, MD  hydrochlorothiazide  (HYDRODIURIL ) 12.5 MG tablet Take 12.5 mg by mouth daily. 12/30/21   [provider]  lisinopril  (PRINIVIL ,ZESTRIL ) 20 MG tablet Take 20 mg by mouth 2 (two) times daily.    [provider]  meclizine  (ANTIVERT ) 25 MG tablet Take 1 tablet (25 mg total) by mouth 3 (three) times daily as needed for dizziness. Patient not taking: Reported on 07/22/2023 02/27/22   Haviland, Julie, MD  metoprolol  succinate (TOPROL -XL) 50 MG 24 hr tablet Take 50 mg by mouth daily. 12/30/21   [provider]  Multiple Vitamins-Minerals (MULTIVITAMIN WITH MINERALS) tablet Take 1 tablet by mouth daily. Woman    [provider]  pregabalin (LYRICA) 75 MG capsule Take 75 mg by mouth 3 (three) times daily. 09/02/21   [provider]    Allergies: Patient has no known allergies.    Review of Systems  Cardiovascular:  Positive for  chest pain.    Updated Vital Signs BP (!) 153/70 (BP Location: Left Arm)   Pulse 63   Temp 98.3 F (36.8 C) (Oral)   Resp 17   Ht 5' 2 (1.575 m)   Wt 78 kg   LMP  (LMP Unknown)   SpO2 100%   BMI 31.45 kg/m   Physical Exam Vitals and nursing note reviewed.  HENT:     Head: Normocephalic and atraumatic.  Eyes:     Pupils: Pupils are equal, round, and reactive to light.  Cardiovascular:     Rate and Rhythm: Normal rate and regular rhythm.  Pulmonary:     Effort: Pulmonary effort is normal.     Breath sounds: Normal breath sounds.  Abdominal:     Palpations: Abdomen is soft.     Tenderness: There is no abdominal tenderness.  Musculoskeletal:     Right lower leg: No edema.     Left lower leg: No edema.  Skin:    General: Skin is warm and dry.  Neurological:     Mental Status: She is alert.  Psychiatric:        Mood and Affect: Mood normal.     (all labs ordered are listed, but only abnormal results are displayed) Labs Reviewed  COMPREHENSIVE METABOLIC PANEL WITH GFR - Abnormal; Notable for the following components:      Result Value   Glucose, Bld 133 (*)    Creatinine, Ser  1.05 (*)    All other components within normal limits  CBC WITH DIFFERENTIAL/PLATELET  MAGNESIUM  TROPONIN I (HIGH SENSITIVITY)  TROPONIN I (HIGH SENSITIVITY)    EKG: EKG Interpretation Date/Time:  Sunday February 29 2024 12:00:50 EDT Ventricular Rate:  74 PR Interval:  161 QRS Duration:  88 QT Interval:  375 QTC Calculation: 416 R Axis:   58  Text Interpretation: Sinus rhythm Consider left atrial enlargement Consider left ventricular hypertrophy Confirmed by Pamella Sharper [phone]) on 02/29/2024 1:32:12 PM  Radiology: DG Chest Portable 1 View Result Date: 02/29/2024 CLINICAL DATA:  Chest pain. EXAM: PORTABLE CHEST 1 VIEW COMPARISON:  10/23/2021 and CT chest 02/25/2018. FINDINGS: Trachea is midline. Heart is enlarged, stable. Lungs are clear. No pleural fluid. IMPRESSION: No acute  findings. Electronically Signed   By: Newell Eke M.D.   On: 02/29/2024 13:45     Procedures   Medications Ordered in the ED  sodium chloride  0.9 % bolus 1,000 mL (0 mLs Intravenous Stopped 02/29/24 1403)    Clinical Course as of 02/29/24 1545  Sun Feb 29, 2024  1543 Initial troponin of 6 with delta troponin of 12 not consistent with ACS.  Remainder of laboratory workup unremarkable.  Discussed these findings with her family members and the patient in the ED.  Will provide cardiology referral given this is not the first time chest pain episode has happened and she has never seen a cardiologist for this reason.  Stable for discharge at this time.  Return precautions were discussed in detail. [MP]    Clinical Course User Index [MP] Pamella Sharper LABOR, DO                                 Medical Decision Making 63 year old female with history as above presenting to the ED for chest pain.  Chest pain started while patient at church around 1100.  EMS gave 324 aspirin prior to arrival.  Chest pain has resolved since come to the ED.  Well-appearing slightly hypertensive here.  Presentation concerning for ACS.  Will obtain high-sensitivity troponin EKG and continue to monitor on telemetry.  Also need to consider dysrhythmia, anemia, electrolyte imbalance, or respiratory illness.  Amount and/or Complexity of Data Reviewed Labs: ordered. Radiology: ordered.        Final diagnoses:  Chest pain, unspecified type    ED Discharge Orders          Ordered    Ambulatory referral to Cardiology       Comments: If you have not heard from the Cardiology office within the next 72 hours please call [phone].   02/29/24 1544               Pamella Sharper LABOR, DO 02/29/24 1545

## 2024-02-29 NOTE — ED Triage Notes (Signed)
 Pt BIB [facility] from church due to onset of chest pain at 1100. 324 aspiring given en route. VS BP 155/57, HR 57, CBG 128, SpO2 100%RA

## 2024-02-29 NOTE — ED Notes (Signed)
 CCMD called by this RN

## 2024-05-04 ENCOUNTER — Ambulatory Visit: Admitting: Cardiology

## 2024-05-19 DIAGNOSIS — E1143 Type 2 diabetes mellitus with diabetic autonomic (poly)neuropathy: Secondary | ICD-10-CM | POA: Diagnosis not present

## 2024-05-19 DIAGNOSIS — E7800 Pure hypercholesterolemia, unspecified: Secondary | ICD-10-CM | POA: Diagnosis not present

## 2024-05-19 DIAGNOSIS — I10 Essential (primary) hypertension: Secondary | ICD-10-CM | POA: Diagnosis not present

## 2024-05-19 DIAGNOSIS — Z6830 Body mass index (BMI) 30.0-30.9, adult: Secondary | ICD-10-CM | POA: Diagnosis not present

## 2024-05-19 DIAGNOSIS — E559 Vitamin D deficiency, unspecified: Secondary | ICD-10-CM | POA: Diagnosis not present

## 2024-05-19 DIAGNOSIS — R079 Chest pain, unspecified: Secondary | ICD-10-CM | POA: Diagnosis not present

## 2024-05-31 ENCOUNTER — Other Ambulatory Visit: Payer: Self-pay | Admitting: Emergency Medicine

## 2024-05-31 ENCOUNTER — Encounter: Payer: Self-pay | Admitting: Cardiology

## 2024-05-31 ENCOUNTER — Other Ambulatory Visit
Admission: RE | Admit: 2024-05-31 | Discharge: 2024-05-31 | Disposition: A | Discharge status: Home / Self Care | Source: Ambulatory Visit | Attending: Cardiology | Admitting: Cardiology

## 2024-05-31 ENCOUNTER — Ambulatory Visit: Attending: Cardiology | Admitting: Cardiology

## 2024-05-31 VITALS — BP 170/78 | HR 70 | Ht 62.0 in | Wt 172.2 lb

## 2024-05-31 DIAGNOSIS — E782 Mixed hyperlipidemia: Secondary | ICD-10-CM | POA: Diagnosis not present

## 2024-05-31 DIAGNOSIS — I10 Essential (primary) hypertension: Secondary | ICD-10-CM

## 2024-05-31 DIAGNOSIS — R072 Precordial pain: Secondary | ICD-10-CM | POA: Diagnosis not present

## 2024-05-31 DIAGNOSIS — Z79899 Other long term (current) drug therapy: Secondary | ICD-10-CM | POA: Insufficient documentation

## 2024-05-31 LAB — BASIC METABOLIC PANEL WITH GFR
Anion gap: 14 (ref 5–15)
BUN: 21 mg/dL (ref 8–23)
CO2: 27 mmol/L (ref 22–32)
Calcium: 9.9 mg/dL (ref 8.9–10.3)
Chloride: 101 mmol/L (ref 98–111)
Creatinine, Ser: 1.29 mg/dL — ABNORMAL HIGH (ref 0.44–1.00)
GFR, Estimated: 47 mL/min — ABNORMAL LOW (ref >60)
Glucose, Bld: 108 mg/dL — ABNORMAL HIGH (ref 70–99)
Potassium: 3.9 mmol/L (ref 3.5–5.1)
Sodium: 142 mmol/L (ref 135–145)

## 2024-05-31 MED ORDER — METOPROLOL TARTRATE 100 MG PO TABS
ORAL_TABLET | ORAL | 0 refills | Status: AC
Start: 2024-05-31 — End: ?

## 2024-05-31 NOTE — Progress Notes (Signed)
 Cardiology Office Note:    Date:  05/31/2024   ID:  Shannon Williamson, DOB 1960-09-21, MRN [id number]  PCP:  Shelda Atlas, MD   [facility] Providers Cardiologist:  None     Referring MD: Gwenn Hila, NP   Chief Complaint  Patient presents with   Hypertension    Has chest pain from time to time, patient becomes breathless. Patient experiences episodes from time to time. Meds reviewed.    Shannon Williamson is a 63 y.o. female who is being seen today for the evaluation of chest pain at the request of Gwenn Hila, NP.   History of Present Illness:    Shannon Williamson is a 63 y.o. female with a hx of hypertension presenting with chest pain.  States having chest pain over the past 2 months associated with shortness of breath.  Symptoms are not associated with exertion, occurring sporadically.  States having shocklike events over the past 4 years after having back surgery 4 years ago.  Symptoms usually preceded by electric shock in the back, proceeding with breathlessness and chest discomfort.  BP at home is usually better controlled with systolics in the 130s to 140s.  Compliant with medications as prescribed.  Past Medical History:  Diagnosis Date   Allergy    Arthritis    Hypertension     Past Surgical History:  Procedure Laterality Date   BACK SURGERY  2021   CARPAL TUNNEL RELEASE Right 06/21/2013   Procedure: RIGHT CARPAL TUNNEL RELEASE;  Surgeon: Arley JONELLE Curia, MD;  Location: [facility];  Service: Orthopedics;  Laterality: Right;   NO PAST SURGERIES      Current Medications: Current Meds  Medication Sig   atorvastatin (LIPITOR) 10 MG tablet Take 10 mg by mouth at bedtime.   B Complex-Biotin-FA (B-COMPLEX PO) Take 1 tablet by mouth daily.   D-5000 125 MCG (5000 UT) TABS Take 1 tablet by mouth daily.   hydrochlorothiazide  (HYDRODIURIL ) 12.5 MG tablet Take 12.5 mg by mouth daily.   lisinopril  (PRINIVIL ,ZESTRIL ) 20 MG tablet Take 20 mg  by mouth 2 (two) times daily.   metoprolol  succinate (TOPROL -XL) 50 MG 24 hr tablet Take 50 mg by mouth daily.   metoprolol  tartrate (LOPRESSOR ) 100 MG tablet TAKE 1 TABLET 2 HR PRIOR TO CARDIAC PROCEDURE   Multiple Vitamins-Minerals (MULTIVITAMIN WITH MINERALS) tablet Take 1 tablet by mouth daily. Woman   OZEMPIC, 0.25 OR 0.5 MG/DOSE, 2 MG/3ML SOPN Inject 0.25 mg into the skin once a week.   pregabalin (LYRICA) 75 MG capsule Take 75 mg by mouth 3 (three) times daily.     Allergies:   Patient has no known allergies.   Social History   Socioeconomic History   Marital status: Married    Spouse name: Not on file   Number of children: 4   Years of education: Not on file   Highest education level: Not on file  Occupational History   Not on file  Tobacco Use   Smoking status: Never   Smokeless tobacco: Never  Vaping Use   Vaping status: Never Used  Substance and Sexual Activity   Alcohol use: No   Drug use: No   Sexual activity: Yes    Partners: Male  Other Topics Concern   Not on file  Social History Narrative   Not on file   Social Drivers of Health   Financial Resource Strain: Not on file  Food Insecurity: Not on file  Transportation Needs: Not on file  Physical  Activity: Not on file  Stress: Not on file  Social Connections: Not on file     Family History: The patient's family history is negative for Breast cancer.  ROS:   Please see the history of present illness.     All other systems reviewed and are negative.  EKGs/Labs/Other Studies Reviewed:    The following studies were reviewed today:  EKG Interpretation Date/Time:  Monday May 31 2024 09:20:05 EST Ventricular Rate:  70 PR Interval:  128 QRS Duration:  84 QT Interval:  384 QTC Calculation: 414 R Axis:   -3  Text Interpretation: Normal sinus rhythm Minimal voltage criteria for LVH, may be normal variant ( R in aVL ) Septal infarct , age undetermined Confirmed by Darliss Rogue ([id number]) on  05/31/2024 9:26:40 AM    Recent Labs: 02/29/2024: ALT 28; BUN 19; Creatinine, Ser 1.05; Hemoglobin 12.3; Magnesium 2.0; Platelets 185; Potassium 4.0; Sodium 138  Recent Lipid Panel    Component Value Date/Time   CHOL 191 07/26/2021 1625   TRIG 326 (H) 07/26/2021 1625   HDL 43 (L) 07/26/2021 1625   CHOLHDL 4.4 07/26/2021 1625   LDLCALC 105 (H) 07/26/2021 1625     Risk Assessment/Calculations:            Physical Exam:    VS:  BP (!) 170/78   Pulse 70   Ht 5' 2 (1.575 m)   Wt 172 lb 3.2 oz (78.1 kg)   LMP  (LMP Unknown)   SpO2 99%   BMI 31.50 kg/m     Wt Readings from Last 3 Encounters:  05/31/24 172 lb 3.2 oz (78.1 kg)  02/29/24 171 lb 15.3 oz (78 kg)  07/22/23 173 lb 6.4 oz (78.7 kg)     GEN:  Well nourished, well developed in no acute distress HEENT: Normal NECK: No JVD; No carotid bruits CARDIAC: RRR, no murmurs, rubs, gallops RESPIRATORY:  Clear to auscultation without rales, wheezing or rhonchi  ABDOMEN: Soft, non-tender, non-distended MUSCULOSKELETAL:  No edema; No deformity  SKIN: Warm and dry NEUROLOGIC:  Alert and oriented x 3 PSYCHIATRIC:  Normal affect   ASSESSMENT:    1. Precordial pain   2. White coat syndrome with diagnosis of hypertension   3. Mixed hyperlipidemia    PLAN:    In order of problems listed above:  Chest pain, several risk factors.  Obtain echo, obtain coronary CT. Hypertension, BP elevated, better control at home.  Continue HCTZ 12.5 mg daily, lisinopril  20 mg twice daily, Toprol -XL 50 mg daily. Hyperlipidemia, agree with Lipitor 40 mg daily.   Follow-up after cardiac testing      Medication Adjustments/Labs and Tests Ordered: Current medicines are reviewed at length with the patient today.  Concerns regarding medicines are outlined above.  Orders Placed This Encounter  Procedures   CT CORONARY MORPH W/CTA COR W/SCORE W/CA W/CM &/OR WO/CM   EKG 12-Lead   ECHOCARDIOGRAM COMPLETE   Meds ordered this encounter   Medications   metoprolol  tartrate (LOPRESSOR ) 100 MG tablet    Sig: TAKE 1 TABLET 2 HR PRIOR TO CARDIAC PROCEDURE    Dispense:  1 tablet    Refill:  0    Patient Instructions  Medication Instructions:  - take one 100 mg metoprolol  two hours prior to cardiac CT  *If you need a refill on your cardiac medications before your next appointment, please call your pharmacy*  Lab Work: Your provider would like for you to have following labs drawn today BMP.  If you have labs (blood work) drawn today and your tests are completely normal, you will receive your results only by: MyChart Message (if you have MyChart) OR A paper copy in the mail If you have any lab test that is abnormal or we need to change your treatment, we will call you to review the results.  Testing/Procedures: Your physician has requested that you have an echocardiogram. Echocardiography is a painless test that uses sound waves to create images of your heart. It provides your doctor with information about the size and shape of your heart and how well your heart's chambers and valves are working.   You may receive an ultrasound enhancing agent through an IV if needed to better visualize your heart during the echo. This procedure takes approximately one hour.  There are no restrictions for this procedure.  This will take place at [address] [facility] [address], [place] [address]  Please note: We ask at that you not bring children with you during ultrasound (echo/ vascular) testing. Due to room size and safety concerns, children are not allowed in the ultrasound rooms during exams. Our front office staff cannot provide observation of children in our lobby area while testing is being conducted. An adult accompanying a patient to their appointment will only be allowed in the ultrasound room at the discretion of the ultrasound technician under special circumstances. We apologize for any inconvenience.   Your  cardiac CT will be scheduled at:  [facility] [address] [place], [place] [address] [phone]  Please arrive 15 mins early for check-in and test prep.  There is spacious parking and easy access to the radiology department from the [facility] entrance. Please enter here and check-in with the desk attendant.    Please follow these instructions carefully (unless otherwise directed):  An IV will be required for this test and Nitroglycerin will be given.  Hold all erectile dysfunction medications at least 3 days (72 hrs) prior to test. (Ie viagra, cialis, sildenafil, tadalafil, etc)     On the Night Before the Test: Be sure to Drink plenty of water . Do not consume any caffeinated/decaffeinated beverages or chocolate 12 hours prior to your test. Do not take any antihistamines 12 hours prior to your test.  On the Day of the Test: Drink plenty of water  until 1 hour prior to the test. Do not eat any food 1 hour prior to test. You may take your regular medications prior to the test.  Take metoprolol  (Lopressor ) two hours prior to test. If you take Furosemide/Hydrochlorothiazide /Spironolactone/Chlorthalidone, please HOLD on the morning of the test. Patients who wear a continuous glucose monitor MUST remove the device prior to scanning. FEMALES- please wear underwire-free bra if available, avoid dresses & tight clothing       After the Test: Drink plenty of water . After receiving IV contrast, you may experience a mild flushed feeling. This is normal. On occasion, you may experience a mild rash up to 24 hours after the test. This is not dangerous. If this occurs, you can take Benadryl 25 mg, Zyrtec, Claritin , or Allegra and increase your fluid intake. (Patients taking Tikosyn should avoid Benadryl, and may take Zyrtec, Claritin , or Allegra) If you experience trouble breathing, this can be serious. If it is severe call 911 IMMEDIATELY. If it is  mild, please call our office.  We will call to schedule your test 2-4 weeks out understanding that some insurance companies will need  an authorization prior to the service being performed.   For more information and frequently asked questions, please visit our website : [email or web address]  For non-scheduling related questions, please contact the cardiac imaging nurse navigator should you have any questions/concerns: Cardiac Imaging Nurse Navigators Direct Office Dial: [phone]   For scheduling needs, including cancellations and rescheduling, please call Brittany, [phone].    Follow-Up: At [facility], you and your health needs are our priority.  As part of our continuing mission to provide you with exceptional heart care, our providers are all part of one team.  This team includes your primary Cardiologist (physician) and Advanced Practice Providers or APPs (Physician Assistants and Nurse Practitioners) who all work together to provide you with the care you need, when you need it.  Your next appointment:   3 month(s)  Provider:   You may see Dr Darliss or one of the following Advanced Practice Providers on your designated Care Team:   Lonni Meager, NP Lesley Maffucci, PA-C Bernardino Bring, PA-C Cadence Bryant, PA-C Tylene Lunch, NP Barnie Hila, NP    We recommend signing up for the patient portal called MyChart.  Sign up information is provided on this After Visit Summary.  MyChart is used to connect with patients for Virtual Visits (Telemedicine).  Patients are able to view lab/test results, encounter notes, upcoming appointments, etc.  Non-urgent messages can be sent to your provider as well.   To learn more about what you can do with MyChart, go to [email or web address].             Signed, Redell Darliss, MD  05/31/2024 10:28 AM    [facility]

## 2024-05-31 NOTE — Patient Instructions (Signed)
 Medication Instructions:  - take one 100 mg metoprolol  two hours prior to cardiac CT  *If you need a refill on your cardiac medications before your next appointment, please call your pharmacy*  Lab Work: Your provider would like for you to have following labs drawn today BMP.   If you have labs (blood work) drawn today and your tests are completely normal, you will receive your results only by: MyChart Message (if you have MyChart) OR A paper copy in the mail If you have any lab test that is abnormal or we need to change your treatment, we will call you to review the results.  Testing/Procedures: Your physician has requested that you have an echocardiogram. Echocardiography is a painless test that uses sound waves to create images of your heart. It provides your doctor with information about the size and shape of your heart and how well your heart's chambers and valves are working.   You may receive an ultrasound enhancing agent through an IV if needed to better visualize your heart during the echo. This procedure takes approximately one hour.  There are no restrictions for this procedure.  This will take place at [address] [facility]) [address], [place] [address]  Please note: We ask at that you not bring children with you during ultrasound (echo/ vascular) testing. Due to room size and safety concerns, children are not allowed in the ultrasound rooms during exams. Our front office staff cannot provide observation of children in our lobby area while testing is being conducted. An adult accompanying a patient to their appointment will only be allowed in the ultrasound room at the discretion of the ultrasound technician under special circumstances. We apologize for any inconvenience.   Your cardiac CT will be scheduled at:  [facility] [address] [place], [place] [address] [phone]  Please arrive 15 mins early for check-in and test  prep.  There is spacious parking and easy access to the radiology department from the [facility] Heart and Vascular entrance. Please enter here and check-in with the desk attendant.    Please follow these instructions carefully (unless otherwise directed):  An IV will be required for this test and Nitroglycerin will be given.  Hold all erectile dysfunction medications at least 3 days (72 hrs) prior to test. (Ie viagra, cialis, sildenafil, tadalafil, etc)     On the Night Before the Test: Be sure to Drink plenty of water . Do not consume any caffeinated/decaffeinated beverages or chocolate 12 hours prior to your test. Do not take any antihistamines 12 hours prior to your test.  On the Day of the Test: Drink plenty of water  until 1 hour prior to the test. Do not eat any food 1 hour prior to test. You may take your regular medications prior to the test.  Take metoprolol  (Lopressor ) two hours prior to test. If you take Furosemide/Hydrochlorothiazide /Spironolactone/Chlorthalidone, please HOLD on the morning of the test. Patients who wear a continuous glucose monitor MUST remove the device prior to scanning. FEMALES- please wear underwire-free bra if available, avoid dresses & tight clothing       After the Test: Drink plenty of water . After receiving IV contrast, you may experience a mild flushed feeling. This is normal. On occasion, you may experience a mild rash up to 24 hours after the test. This is not dangerous. If this occurs, you can take Benadryl 25 mg, Zyrtec, Claritin , or Allegra and increase your fluid intake. (Patients taking Tikosyn should avoid  Benadryl, and may take Zyrtec, Claritin , or Allegra) If you experience trouble breathing, this can be serious. If it is severe call 911 IMMEDIATELY. If it is mild, please call our office.  We will call to schedule your test 2-4 weeks out understanding that some insurance companies will need an authorization prior to the service being  performed.   For more information and frequently asked questions, please visit our website : [email or web address]  For non-scheduling related questions, please contact the cardiac imaging nurse navigator should you have any questions/concerns: Cardiac Imaging Nurse Navigators Direct Office Dial: [phone]   For scheduling needs, including cancellations and rescheduling, please call Brittany, [phone].    Follow-Up: At [facility], you and your health needs are our priority.  As part of our continuing mission to provide you with exceptional heart care, our providers are all part of one team.  This team includes your primary Cardiologist (physician) and Advanced Practice Providers or APPs (Physician Assistants and Nurse Practitioners) who all work together to provide you with the care you need, when you need it.  Your next appointment:   3 month(s)  Provider:   You may see Dr Darliss or one of the following Advanced Practice Providers on your designated Care Team:   Lonni Meager, NP Lesley Maffucci, PA-C Bernardino Bring, PA-C Cadence Snelling, PA-C Tylene Lunch, NP Barnie Hila, NP    We recommend signing up for the patient portal called MyChart.  Sign up information is provided on this After Visit Summary.  MyChart is used to connect with patients for Virtual Visits (Telemedicine).  Patients are able to view lab/test results, encounter notes, upcoming appointments, etc.  Non-urgent messages can be sent to your provider as well.   To learn more about what you can do with MyChart, go to [email or web address].

## 2024-06-21 ENCOUNTER — Ambulatory Visit: Admitting: Cardiology

## 2024-06-30 ENCOUNTER — Encounter (HOSPITAL_COMMUNITY): Payer: Self-pay

## 2024-07-02 ENCOUNTER — Ambulatory Visit (HOSPITAL_COMMUNITY): Admission: RE | Admit: 2024-07-02 | Discharge: 2024-07-02 | Attending: Cardiology

## 2024-07-02 DIAGNOSIS — R072 Precordial pain: Secondary | ICD-10-CM

## 2024-07-02 MED ORDER — NITROGLYCERIN 0.4 MG SL SUBL
0.8000 mg | SUBLINGUAL_TABLET | Freq: Once | SUBLINGUAL | Status: AC
  Administered 2024-07-02: 0.8 mg via SUBLINGUAL

## 2024-07-02 MED ORDER — IOHEXOL 350 MG/ML SOLN
100.0000 mL | Freq: Once | INTRAVENOUS | Status: AC | PRN
  Administered 2024-07-02: 100 mL via INTRAVENOUS

## 2024-07-12 ENCOUNTER — Ambulatory Visit: Payer: Self-pay | Admitting: Cardiology

## 2024-07-23 ENCOUNTER — Ambulatory Visit: Attending: Cardiology

## 2024-07-23 DIAGNOSIS — R072 Precordial pain: Secondary | ICD-10-CM

## 2024-07-23 LAB — ECHOCARDIOGRAM COMPLETE
AR max vel: 1.22 cm2
AV Area VTI: 1.36 cm2
AV Area mean vel: 1.33 cm2
AV Mean grad: 5 mmHg
AV Peak grad: 9.4 mmHg
Ao pk vel: 1.53 m/s
Area-P 1/2: 2.83 cm2
S' Lateral: 1.9 cm

## 2024-08-26 NOTE — Progress Notes (Unsigned)
" ° °  Cardiology Clinic Note   Date: 08/26/2024 ID: Danya, Spearman 12/13/1960, MRN [id number]  Primary Cardiologist:  None  Chief Complaint   Shannon Williamson is a 64 y.o. female who presents to the clinic today for ***  Patient Profile   Shannon Williamson is followed by Dr. Darliss for the history outlined below.      Past medical history significant for: Chest pain/dyspnea. Coronary CTA 07/02/2024: Calcium score of 0. Echo 07/23/2024: EF 60 to 65%.  No RWMA.  Grade I DD.  Normal global strain.  Normal RV size/function.  No significant valvular abnormalities. Hypertension. Hyperlipidemia.  In summary, patient was first evaluated by Dr. Darliss on 05/31/2024 for chest pain and shortness of breath.  She reported a 11-month history of chest pain and dyspnea not associated with exertion occurring sporadically.  She reported shocklike events x 4 years after having back surgery.  Symptoms of dyspnea and discomfort usually preceded by electric shock back.  She underwent echo and coronary CTA which were unremarkable.     History of Present Illness    Today, patient ***  Chest pain/dyspnea Coronary CTA December 2025 demonstrated calcium score of 0.  Echo January 2026 demonstrated normal LV/RV function, Grade I DD, no significant valvular abnormalities.  Patient*** -***  Hypertension BP today*** - Continue hydrochlorothiazide , lisinopril , Toprol .  Hyperlipidemia LDL 105 January 2023*** - Continue atorvastatin. - Lipid panel***  ROS: All other systems reviewed and are otherwise negative except as noted in History of Present Illness.  EKGs/Labs Reviewed        02/29/2024: ALT 28; AST 41 05/31/2024: BUN 21; Creatinine, Ser 1.29; Potassium 3.9; Sodium 142   02/29/2024: Hemoglobin 12.3; WBC 5.5   No results found for requested labs within last 365 days.   No results found for requested labs within last 365 days.  ***  Risk Assessment/Calculations    {Does this  patient have ATRIAL FIBRILLATION?:[phone]} No BP recorded.  {Refresh Note OR Click here to enter BP  :1}***        Physical Exam    VS:  LMP  (LMP Unknown)  , BMI There is no height or weight on file to calculate BMI.  GEN: Well nourished, well developed, in no acute distress. Neck: No JVD or carotid bruits. Cardiac: *** RRR. *** No murmur. No rubs or gallops.   Respiratory:  Respirations regular and unlabored. Clear to auscultation without rales, wheezing or rhonchi. GI: Soft, nontender, nondistended. Extremities: Radials/DP/PT 2+ and equal bilaterally. No clubbing or cyanosis. No edema ***  Skin: Warm and dry, no rash. Neuro: Strength intact.  Assessment & Plan   ***  Disposition: ***     {Are you ordering a CV Procedure (e.g. stress test, cath, DCCV, TEE, etc)?   Press F2        :[id number]}   Signed, Barnie HERO. Shateria Paternostro, DNP, NP-C  "

## 2024-08-31 ENCOUNTER — Ambulatory Visit: Admitting: Student
